# Patient Record
Sex: Female | Born: 1945
Health system: Southern US, Community
[De-identification: ages and names within clinical notes are randomized; demographics above are authoritative.]

## PROBLEM LIST (undated history)

## (undated) DIAGNOSIS — M1990 Unspecified osteoarthritis, unspecified site: Secondary | ICD-10-CM

## (undated) DIAGNOSIS — I499 Cardiac arrhythmia, unspecified: Secondary | ICD-10-CM

## (undated) DIAGNOSIS — K219 Gastro-esophageal reflux disease without esophagitis: Secondary | ICD-10-CM

## (undated) DIAGNOSIS — G4700 Insomnia, unspecified: Secondary | ICD-10-CM

## (undated) DIAGNOSIS — I4891 Unspecified atrial fibrillation: Secondary | ICD-10-CM

## (undated) DIAGNOSIS — R7302 Impaired glucose tolerance (oral): Secondary | ICD-10-CM

## (undated) DIAGNOSIS — I10 Essential (primary) hypertension: Secondary | ICD-10-CM

## (undated) DIAGNOSIS — I7300 Raynaud's syndrome without gangrene: Secondary | ICD-10-CM

## (undated) DIAGNOSIS — C4490 Unspecified malignant neoplasm of skin, unspecified: Secondary | ICD-10-CM

## (undated) HISTORY — DX: Unspecified malignant neoplasm of skin, unspecified: C44.90

## (undated) HISTORY — DX: Raynaud's syndrome without gangrene: I73.00

## (undated) HISTORY — DX: Insomnia, unspecified: G47.00

## (undated) HISTORY — PX: COSMETIC SURGERY: SHX468

## (undated) HISTORY — DX: Impaired glucose tolerance (oral): R73.02

## (undated) HISTORY — PX: JOINT REPLACEMENT: SHX530

## (undated) HISTORY — PX: TUBAL LIGATION: SHX77

## (undated) HISTORY — DX: Gastro-esophageal reflux disease without esophagitis: K21.9

## (undated) HISTORY — DX: Essential (primary) hypertension: I10

## (undated) HISTORY — PX: COLONOSCOPY: SHX174

## (undated) HISTORY — PX: TONSILLECTOMY: SUR1361

## (undated) HISTORY — PX: ABDOMINOPLASTY: SUR9

---

## 1997-07-02 ENCOUNTER — Other Ambulatory Visit: Admission: RE | Admit: 1997-07-02 | Discharge: 1997-07-02 | Payer: Self-pay | Admitting: Gynecology

## 1997-12-01 ENCOUNTER — Ambulatory Visit (HOSPITAL_COMMUNITY): Admission: RE | Admit: 1997-12-01 | Discharge: 1997-12-01 | Payer: Self-pay | Admitting: Gynecology

## 1997-12-10 ENCOUNTER — Other Ambulatory Visit: Admission: RE | Admit: 1997-12-10 | Discharge: 1997-12-10 | Payer: Self-pay | Admitting: Gynecology

## 1998-12-07 ENCOUNTER — Ambulatory Visit (HOSPITAL_COMMUNITY): Admission: RE | Admit: 1998-12-07 | Discharge: 1998-12-07 | Payer: Self-pay | Admitting: Gynecology

## 1998-12-07 ENCOUNTER — Encounter: Payer: Self-pay | Admitting: Gynecology

## 1999-01-12 ENCOUNTER — Other Ambulatory Visit: Admission: RE | Admit: 1999-01-12 | Discharge: 1999-01-12 | Payer: Self-pay | Admitting: Gynecology

## 1999-12-14 ENCOUNTER — Encounter: Payer: Self-pay | Admitting: Gynecology

## 1999-12-14 ENCOUNTER — Ambulatory Visit (HOSPITAL_COMMUNITY): Admission: RE | Admit: 1999-12-14 | Discharge: 1999-12-14 | Payer: Self-pay | Admitting: Gynecology

## 2000-01-25 ENCOUNTER — Other Ambulatory Visit: Admission: RE | Admit: 2000-01-25 | Discharge: 2000-01-25 | Payer: Self-pay | Admitting: Gynecology

## 2000-12-15 ENCOUNTER — Ambulatory Visit (HOSPITAL_COMMUNITY): Admission: RE | Admit: 2000-12-15 | Discharge: 2000-12-15 | Payer: Self-pay | Admitting: Gynecology

## 2000-12-15 ENCOUNTER — Encounter: Payer: Self-pay | Admitting: Gynecology

## 2001-02-06 ENCOUNTER — Other Ambulatory Visit: Admission: RE | Admit: 2001-02-06 | Discharge: 2001-02-06 | Payer: Self-pay | Admitting: Gynecology

## 2001-03-07 HISTORY — PX: BREAST REDUCTION SURGERY: SHX8

## 2002-02-11 ENCOUNTER — Other Ambulatory Visit: Admission: RE | Admit: 2002-02-11 | Discharge: 2002-02-11 | Payer: Self-pay | Admitting: Gynecology

## 2003-02-17 ENCOUNTER — Other Ambulatory Visit: Admission: RE | Admit: 2003-02-17 | Discharge: 2003-02-17 | Payer: Self-pay | Admitting: Gynecology

## 2003-04-14 ENCOUNTER — Ambulatory Visit (HOSPITAL_COMMUNITY): Admission: RE | Admit: 2003-04-14 | Discharge: 2003-04-14 | Payer: Self-pay | Admitting: Internal Medicine

## 2004-02-27 ENCOUNTER — Other Ambulatory Visit: Admission: RE | Admit: 2004-02-27 | Discharge: 2004-02-27 | Payer: Self-pay | Admitting: Gynecology

## 2005-03-24 ENCOUNTER — Other Ambulatory Visit: Admission: RE | Admit: 2005-03-24 | Discharge: 2005-03-24 | Payer: Self-pay | Admitting: Gynecology

## 2012-06-14 ENCOUNTER — Other Ambulatory Visit: Payer: Self-pay | Admitting: Family Medicine

## 2012-07-15 ENCOUNTER — Other Ambulatory Visit: Payer: Self-pay | Admitting: Family Medicine

## 2012-08-12 ENCOUNTER — Other Ambulatory Visit: Payer: Self-pay | Admitting: Family Medicine

## 2012-08-13 ENCOUNTER — Encounter: Payer: Self-pay | Admitting: *Deleted

## 2012-10-07 ENCOUNTER — Other Ambulatory Visit: Payer: Self-pay | Admitting: Family Medicine

## 2012-10-26 ENCOUNTER — Encounter: Payer: Self-pay | Admitting: Family Medicine

## 2012-10-26 ENCOUNTER — Other Ambulatory Visit: Payer: Self-pay | Admitting: Family Medicine

## 2012-10-26 ENCOUNTER — Ambulatory Visit (INDEPENDENT_AMBULATORY_CARE_PROVIDER_SITE_OTHER): Payer: 59 | Admitting: Family Medicine

## 2012-10-26 VITALS — BP 148/100 | Ht 65.0 in | Wt 183.0 lb

## 2012-10-26 DIAGNOSIS — R7301 Impaired fasting glucose: Secondary | ICD-10-CM | POA: Insufficient documentation

## 2012-10-26 DIAGNOSIS — I10 Essential (primary) hypertension: Secondary | ICD-10-CM

## 2012-10-26 DIAGNOSIS — Z79899 Other long term (current) drug therapy: Secondary | ICD-10-CM

## 2012-10-26 DIAGNOSIS — G4700 Insomnia, unspecified: Secondary | ICD-10-CM

## 2012-10-26 DIAGNOSIS — E785 Hyperlipidemia, unspecified: Secondary | ICD-10-CM

## 2012-10-26 LAB — LIPID PANEL
LDL Cholesterol: 113 mg/dL — ABNORMAL HIGH (ref 0–99)
Triglycerides: 52 mg/dL (ref <150)

## 2012-10-26 LAB — HEPATIC FUNCTION PANEL
Alkaline Phosphatase: 58 U/L (ref 39–117)
Indirect Bilirubin: 0.4 mg/dL (ref 0.0–0.9)
Total Bilirubin: 0.5 mg/dL (ref 0.3–1.2)

## 2012-10-26 LAB — BASIC METABOLIC PANEL
BUN: 14 mg/dL (ref 6–23)
Calcium: 9.2 mg/dL (ref 8.4–10.5)
Chloride: 99 mEq/L (ref 96–112)
Creat: 0.6 mg/dL (ref 0.50–1.10)

## 2012-10-26 NOTE — Progress Notes (Signed)
  Subjective:    Patient ID: Sara Terry, female    DOB: 1945/05/13, 67 y.o.   MRN: [id number]  Hypertension This is a chronic problem. The current episode started more than 1 year ago. The problem has been resolved since onset. The problem is controlled. Pertinent negatives include no anxiety or blurred vision. Risk factors for coronary artery disease include dyslipidemia. Past treatments include ACE inhibitors and diuretics. The current treatment provides moderate improvement. There are no compliance problems.  There is no history of angina or heart failure. There is no history of chronic renal disease or coarctation of the aorta.   Here today for 6 month f/u on hypertension.  Sleeping ok, takes [place] rarely. Helps. Benadryl helps too. States she definitely needs to be sleep medicine on hand for times when things flare up.  History of reflux. Clinically stable. Uses a proton pump inhibitor just on a when necessary basis. Actually  History of glucose intolerance. Exercising very regular. Has cut down sugars in the diet. Family history of diabetes.     No other concerns.     Review of Systems  Eyes: Negative for blurred vision.   no chest pain no abdominal pain no back pain ROS otherwise negative     Objective:   Physical Exam  Alert HEENT normal. Lungs clear. Heart regular rate and rhythm. Abdomen benign. Ankles without edema. Blood pressure improved on repeat 134/86      Assessment & Plan:  Impression hypertension likely good control. #2 hyperlipidemia status uncertain. #3 reflux stable #4 insomnia stable. #5 glucose intolerance status uncertain. Plan appropriate blood work. Diet exercise and discussed. Maintain all medications. Check every 6 months. WSL

## 2012-10-28 ENCOUNTER — Encounter: Payer: Self-pay | Admitting: Family Medicine

## 2012-11-10 ENCOUNTER — Other Ambulatory Visit: Payer: Self-pay | Admitting: Family Medicine

## 2013-01-05 ENCOUNTER — Other Ambulatory Visit: Payer: Self-pay | Admitting: Family Medicine

## 2013-02-04 ENCOUNTER — Other Ambulatory Visit: Payer: Self-pay | Admitting: Family Medicine

## 2013-04-17 ENCOUNTER — Encounter (INDEPENDENT_AMBULATORY_CARE_PROVIDER_SITE_OTHER): Payer: Self-pay

## 2013-04-17 ENCOUNTER — Telehealth (INDEPENDENT_AMBULATORY_CARE_PROVIDER_SITE_OTHER): Payer: Self-pay | Admitting: *Deleted

## 2013-04-17 NOTE — Telephone Encounter (Signed)
Rec'd last TCS from [facility], last TCS done 2/05 -- she is due, I have left patient a message to call me so I can schedule

## 2013-04-17 NOTE — Telephone Encounter (Signed)
Message copied by Worthy Keeler on Wed Apr 17, 2013 10:28 AM ------      Message from: Johnston Ebbs      Created: Thu Apr 11, 2013  2:22 PM      Regarding: records of when patient is due TCS       I spoke to this lady last week.  She was not certain when she needed to repeat her TCS.  It looks like last 2005 but there is nothing in her chart about her TCS except that it was done.  NO procedure note.            Can we get this and then call her and let her know.  [phone] ------

## 2013-04-18 NOTE — Telephone Encounter (Signed)
Left message asking patient to call me.

## 2013-04-24 ENCOUNTER — Other Ambulatory Visit (INDEPENDENT_AMBULATORY_CARE_PROVIDER_SITE_OTHER): Payer: Self-pay | Admitting: *Deleted

## 2013-04-24 ENCOUNTER — Telehealth (INDEPENDENT_AMBULATORY_CARE_PROVIDER_SITE_OTHER): Payer: Self-pay | Admitting: *Deleted

## 2013-04-24 DIAGNOSIS — Z1211 Encounter for screening for malignant neoplasm of colon: Secondary | ICD-10-CM

## 2013-04-24 MED ORDER — PEG-KCL-NACL-NASULF-NA ASC-C 100 G PO SOLR: 1.0000 | Freq: Once | ORAL | Status: DC

## 2013-04-24 NOTE — Telephone Encounter (Signed)
Patient needs movi prep 

## 2013-04-24 NOTE — Telephone Encounter (Signed)
TCS sch'd 06/13/13, patient aware

## 2013-05-02 ENCOUNTER — Encounter: Payer: 59 | Admitting: Family Medicine

## 2013-05-08 ENCOUNTER — Encounter: Payer: Self-pay | Admitting: Family Medicine

## 2013-05-08 ENCOUNTER — Ambulatory Visit (HOSPITAL_COMMUNITY)
Admission: RE | Admit: 2013-05-08 | Discharge: 2013-05-08 | Disposition: A | Discharge status: Home / Self Care | Payer: Medicare Other | Source: Ambulatory Visit | Attending: Family Medicine | Admitting: Family Medicine

## 2013-05-08 ENCOUNTER — Ambulatory Visit (INDEPENDENT_AMBULATORY_CARE_PROVIDER_SITE_OTHER): Payer: 59 | Admitting: Family Medicine

## 2013-05-08 VITALS — BP 140/92 | Ht 65.0 in | Wt 184.0 lb

## 2013-05-08 DIAGNOSIS — M545 Low back pain, unspecified: Secondary | ICD-10-CM

## 2013-05-08 MED ORDER — PANTOPRAZOLE SODIUM 40 MG PO TBEC: DELAYED_RELEASE_TABLET | ORAL | Status: DC

## 2013-05-08 MED ORDER — ZOLPIDEM TARTRATE 10 MG PO TABS: 10.0000 mg | ORAL_TABLET | Freq: Every evening | ORAL | Status: DC | PRN

## 2013-05-08 MED ORDER — ENALAPRIL MALEATE 20 MG PO TABS: ORAL_TABLET | ORAL | Status: DC

## 2013-05-08 MED ORDER — HYDROCHLOROTHIAZIDE 25 MG PO TABS: ORAL_TABLET | ORAL | Status: DC
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
## 2013-05-08 NOTE — Patient Instructions (Signed)
Back Exercises Back exercises help treat and prevent back injuries. The goal of back exercises is to increase the strength of your abdominal and back muscles and the flexibility of your back. These exercises should be started when you no longer have back pain. Back exercises include:  Pelvic Tilt. Lie on your back with your knees bent. Tilt your pelvis until the lower part of your back is against the floor. Hold this position 5 to 10 sec and repeat 5 to 10 times.  Knee to Chest. Pull first 1 knee up against your chest and hold for 20 to 30 seconds, repeat this with the other knee, and then both knees. This may be done with the other leg straight or bent, whichever feels better.  Sit-Ups or Curl-Ups. Bend your knees 90 degrees. Start with tilting your pelvis, and do a partial, slow sit-up, lifting your trunk only 30 to 45 degrees off the floor. Take at least 2 to 3 seconds for each sit-up. Do not do sit-ups with your knees out straight. If partial sit-ups are difficult, simply do the above but with only tightening your abdominal muscles and holding it as directed.  Hip-Lift. Lie on your back with your knees flexed 90 degrees. Push down with your feet and shoulders as you raise your hips a couple inches off the floor; hold for 10 seconds, repeat 5 to 10 times.  Back arches. Lie on your stomach, propping yourself up on bent elbows. Slowly press on your hands, causing an arch in your low back. Repeat 3 to 5 times. Any initial stiffness and discomfort should lessen with repetition over time.  Shoulder-Lifts. Lie face down with arms beside your body. Keep hips and torso pressed to floor as you slowly lift your head and shoulders off the floor. Do not overdo your exercises, especially in the beginning. Exercises may cause you some mild back discomfort which lasts for a few minutes; however, if the pain is more severe, or lasts for more than 15 minutes, do not continue exercises until you see your caregiver.  Improvement with exercise therapy for back problems is slow.  See your caregivers for assistance with developing a proper back exercise program. Document Released: 03/31/2004 Document Revised: 05/16/2011 Document Reviewed: 12/23/2010 Exit[employer] Patient Information 2014 [employer].  

## 2013-05-24 ENCOUNTER — Telehealth (INDEPENDENT_AMBULATORY_CARE_PROVIDER_SITE_OTHER): Payer: Self-pay | Admitting: *Deleted

## 2013-05-24 NOTE — Telephone Encounter (Signed)
  Procedure: tcs  Reason/Indication:  screening  Has patient had this procedure before?  Yes, 2005 -- scanned  If so, when, by whom and where?    Is there a family history of colon cancer?  no  Who?  What age when diagnosed?    Is patient diabetic?   no      Does patient have prosthetic heart valve?  no  Do you have a pacemaker?  no  Has patient ever had endocarditis? no  Has patient had joint replacement within last 12 months?  no  Does patient tend to be constipated or take laxatives? no  Is patient on Coumadin, Plavix and/or Aspirin? yes  Medications: asa 81 mg daily, stool softener, enalapril 20 mg daily, hctz 25 mg daily, pantoprazole 40 mg prn  Allergies: pcn  Medication Adjustment: asa 2 days  Procedure date & time: 06/13/13

## 2013-05-27 NOTE — Telephone Encounter (Signed)
agree

## 2013-06-04 ENCOUNTER — Encounter (HOSPITAL_COMMUNITY): Payer: Self-pay | Admitting: Pharmacy Technician

## 2013-06-13 ENCOUNTER — Encounter (HOSPITAL_COMMUNITY): Payer: Self-pay | Admitting: *Deleted

## 2013-06-13 ENCOUNTER — Encounter (HOSPITAL_COMMUNITY)
Admission: RE | Disposition: A | Discharge status: Home / Self Care | Payer: Self-pay | Source: Ambulatory Visit | Attending: Internal Medicine

## 2013-06-13 ENCOUNTER — Ambulatory Visit (HOSPITAL_COMMUNITY)
Admission: RE | Admit: 2013-06-13 | Discharge: 2013-06-13 | Disposition: A | Discharge status: Home / Self Care | Payer: Medicare Other | Source: Ambulatory Visit | Attending: Internal Medicine | Admitting: Internal Medicine

## 2013-06-13 DIAGNOSIS — K644 Residual hemorrhoidal skin tags: Secondary | ICD-10-CM | POA: Insufficient documentation

## 2013-06-13 DIAGNOSIS — Z7982 Long term (current) use of aspirin: Secondary | ICD-10-CM | POA: Insufficient documentation

## 2013-06-13 DIAGNOSIS — Z1211 Encounter for screening for malignant neoplasm of colon: Secondary | ICD-10-CM | POA: Insufficient documentation

## 2013-06-13 DIAGNOSIS — I10 Essential (primary) hypertension: Secondary | ICD-10-CM | POA: Insufficient documentation

## 2013-06-13 DIAGNOSIS — Z79899 Other long term (current) drug therapy: Secondary | ICD-10-CM | POA: Insufficient documentation

## 2013-06-13 HISTORY — PX: COLONOSCOPY: SHX5424

## 2013-06-13 SURGERY — COLONOSCOPY
Anesthesia: Moderate Sedation

## 2013-06-13 MED ORDER — MEPERIDINE HCL 50 MG/ML IJ SOLN
INTRAMUSCULAR | Status: AC
  Filled 2013-06-13: qty 1

## 2013-06-13 MED ORDER — MIDAZOLAM HCL 5 MG/5ML IJ SOLN
INTRAMUSCULAR | Status: AC
  Filled 2013-06-13: qty 10

## 2013-06-13 MED ORDER — MEPERIDINE HCL 50 MG/ML IJ SOLN
INTRAMUSCULAR | Status: DC | PRN
  Administered 2013-06-13 (×2): 25 mg via INTRAVENOUS

## 2013-06-13 MED ORDER — MIDAZOLAM HCL 5 MG/5ML IJ SOLN
INTRAMUSCULAR | Status: DC | PRN
  Administered 2013-06-13 (×5): 2 mg via INTRAVENOUS

## 2013-06-13 MED ORDER — STERILE WATER FOR IRRIGATION IR SOLN
Status: DC | PRN
  Administered 2013-06-13: 10:00:00

## 2013-06-13 NOTE — Op Note (Signed)
COLONOSCOPY PROCEDURE REPORT  PATIENT:  Sara Terry  MR#:  [id number] Birthdate:  [birth year], 68 y.o., female Endoscopist:  Dr. Rogene Houston, MD Referred By:  Dr. Grace Bushy. Wolfgang Phoenix, MD Procedure Date: 06/13/2013  Procedure:   Colonoscopy  Indications: Patient is 68 year old Caucasian female who is here for average risk screening colonoscopy. Patient's last exam was in February 2005.  Informed Consent:  The procedure and risks were reviewed with the patient and informed consent was obtained.  Medications:  Demerol 50 mg IV Versed 10 mg IV  Description of procedure:  After a digital rectal exam was performed, that colonoscope was advanced from the anus through the rectum and colon to the area of the cecum, ileocecal valve and appendiceal orifice. The cecum was deeply intubated. These structures were well-seen and photographed for the record. From the level of the cecum and ileocecal valve, the scope was slowly and cautiously withdrawn. The mucosal surfaces were carefully surveyed utilizing scope tip to flexion to facilitate fold flattening as needed. The scope was pulled down into the rectum where a thorough exam including retroflexion was performed.  Findings:   Prep excellent. Normal mucosa various segments of colon and rectum. Small hemorrhoids below the dentate line.   Therapeutic/Diagnostic Maneuvers Performed:  None  Complications:  None  Cecal Withdrawal Time:  11 minutes  Impression:  Normal colonoscopy except small external hemorrhoids.  Recommendations:  Standard instructions given. Next screening exam in 10 years.  Rogene Houston  06/13/2013 10:10 AM  CC: Dr. Rubbie Battiest, MD & Dr. Rayne Du ref. provider found

## 2013-06-13 NOTE — Discharge Instructions (Signed)
Resume usual medications and diet. No driving for 24 hours. Next screening exam in 10 years.

## 2013-06-13 NOTE — H&P (Signed)
Sara Terry is an 68 y.o. female.   Chief Complaint: Patient is  here for colonoscopy. HPI: Patient is a 68 year old Caucasian female who is here for screening colonoscopy. She denies abdominal pain change in her bowel habits or rectal bleeding.  Last colonoscopy was in February 2005. Family history is negative for CRC to  Past Medical History  Diagnosis Date  . Hypertension   . Raynaud phenomenon   . Insomnia   . Reflux   . Glucose intolerance (impaired glucose tolerance)     Past Surgical History  Procedure Laterality Date  . Tubal ligation    . Tonsillectomy    . Breast reduction surgery  2003  . Colonoscopy      Family History  Problem Relation Age of Onset  . Hypertension Mother   . Hypertension Father   . Diabetes Father   . Heart attack Father   . Hypertension Brother    Social History:  reports that she has never smoked. She does not have any smokeless tobacco history on file. Her alcohol and drug histories are not on file.  Allergies:  Allergies  Allergen Reactions  . Maxzide [Triamterene-Hctz]   . Penicillins     Medications Prior to Admission  Medication Sig Dispense Refill  . aspirin 81 MG tablet Take 81 mg by mouth daily.      Marland Kitchen docusate sodium (COLACE) 100 MG capsule Take 100 mg by mouth daily.      . enalapril (VASOTEC) 20 MG tablet take 1 tablet by mouth once daily  30 tablet  5  . hydrochlorothiazide (HYDRODIURIL) 25 MG tablet take 1 tablet by mouth every morning  30 tablet  5  . omeprazole (PRILOSEC) 20 MG capsule Take 20 mg by mouth daily as needed (heartburn).       . zolpidem (AMBIEN) 10 MG tablet Take 1 tablet (10 mg total) by mouth at bedtime as needed for sleep.  30 tablet  0    No results found for this or any previous visit (from the past 48 hour(s)). No results found.  ROS  Blood pressure 173/94, pulse 69, temperature 98.7 F (37.1 C), temperature source Oral, resp. rate 13, SpO2 100.00%. Physical Exam  Constitutional: She appears  well-developed and well-nourished.  HENT:  Mouth/Throat: Oropharynx is clear and moist.  Eyes: Conjunctivae are normal. No scleral icterus.  Neck: No thyromegaly present.  Cardiovascular: Normal rate, regular rhythm and normal heart sounds.   No murmur heard. Respiratory: Effort normal and breath sounds normal.  GI: Soft. She exhibits no distension and no mass. There is no tenderness.  Long horizontal scar across lower abdomen  Musculoskeletal: She exhibits no edema.  Lymphadenopathy:    She has no cervical adenopathy.  Neurological: She is alert.  Skin: Skin is warm and dry.     Assessment/Plan Average risk screening colonoscopy.  Rogene Houston 06/13/2013, 9:34 AM

## 2013-06-17 ENCOUNTER — Encounter (HOSPITAL_COMMUNITY): Payer: Self-pay | Admitting: Internal Medicine

## 2013-07-25 ENCOUNTER — Encounter: Payer: Self-pay | Admitting: Family Medicine

## 2013-08-02 ENCOUNTER — Ambulatory Visit: Payer: 59 | Admitting: Family Medicine

## 2013-11-27 ENCOUNTER — Other Ambulatory Visit: Payer: Self-pay | Admitting: Family Medicine

## 2013-12-26 ENCOUNTER — Ambulatory Visit (HOSPITAL_COMMUNITY)
Admission: RE | Admit: 2013-12-26 | Discharge: 2013-12-26 | Disposition: A | Discharge status: Home / Self Care | Payer: Medicare Other | Source: Ambulatory Visit | Attending: Family Medicine | Admitting: Family Medicine

## 2013-12-26 ENCOUNTER — Encounter: Payer: Self-pay | Admitting: Family Medicine

## 2013-12-26 ENCOUNTER — Ambulatory Visit (INDEPENDENT_AMBULATORY_CARE_PROVIDER_SITE_OTHER): Payer: 59 | Admitting: Family Medicine

## 2013-12-26 VITALS — BP 142/94 | Ht 65.0 in | Wt 184.0 lb

## 2013-12-26 DIAGNOSIS — M25551 Pain in right hip: Secondary | ICD-10-CM | POA: Insufficient documentation

## 2013-12-26 DIAGNOSIS — Z23 Encounter for immunization: Secondary | ICD-10-CM

## 2013-12-26 MED ORDER — ENALAPRIL MALEATE 20 MG PO TABS: ORAL_TABLET | ORAL | Status: DC

## 2013-12-26 MED ORDER — DICLOFENAC SODIUM 75 MG PO TBEC
75.0000 mg | DELAYED_RELEASE_TABLET | Freq: Two times a day (BID) | ORAL | Status: DC
Start: 2013-12-26 — End: 2014-04-03

## 2013-12-26 MED ORDER — HYDROCHLOROTHIAZIDE 25 MG PO TABS: ORAL_TABLET | ORAL | Status: DC

## 2013-12-26 NOTE — Progress Notes (Signed)
   Subjective:    Patient ID: Sara Terry, female    DOB: [birth year], 68 y.o.   MRN: [id number]  Hypertension This is a chronic problem. There are no compliance problems.    Bp a little hi recently  Exercising regularly  Experiencing pain in hip and leg  Bothered for a couple of months   takes advil two to four per day, not every day  Post hip discomfort and kind of a numb like feeling  Limping and walking harder, keep going anyway  Compliant with blood pressure medication. Watching salt intake. Blood pressure generally good when checked elsewhere.  Uses Ambien only rarely for sleep.   Review of Systems No headache no chest pain no abdominal pain no change in bowel habits no blood in stool no rash ROS otherwise negative    Objective:   Physical Exam Alert no acute distress. HEENT normal. Lungs clear. Heart rare rhythm. Hip good range of motion. Some pain with internal rotation. Mild posterior right sciatic notch tenderness. Negative straight leg raise.       Assessment & Plan:  Impression hip bursitis versus tendinitis inflammation. #2 hypertension good control. #3 insomnia #4 reflux discussed plan maintain same medications. Ativan inflammatory. Right hip x-ray local measures and stretching discussed. Followup in 6 months. Pneumonia shot also WSL

## 2013-12-31 NOTE — Addendum Note (Signed)
Addended by: Dairl Ponder on: 12/31/2013 08:49 AM   Modules accepted: Orders

## 2014-01-15 ENCOUNTER — Telehealth: Payer: Self-pay | Admitting: *Deleted

## 2014-01-15 NOTE — Telephone Encounter (Signed)
Patient notified and verbalized understanding. She will discuss with ortho on 12/4 and call back sooner if s/s get worst.

## 2014-01-15 NOTE — Telephone Encounter (Signed)
Patient said her friends told her that her left knee and left ankle are swollen. Pt said she did not even notice. She said it does feel tight. No pain or redness. Her friends noticed this a couple of days ago and they are still swollen. The patient would like to know if she needs to be seen or if there is something you suggest?    Cell [phone]

## 2014-01-15 NOTE — Telephone Encounter (Signed)
Just a wk ago dxed with advanced arthritis in hip could well have in knee and ankle. Pt can disc with ortho as already referred, or can come in for eval

## 2014-02-21 ENCOUNTER — Other Ambulatory Visit: Payer: Self-pay | Admitting: Family Medicine

## 2014-04-03 ENCOUNTER — Ambulatory Visit (INDEPENDENT_AMBULATORY_CARE_PROVIDER_SITE_OTHER): Payer: Medicare Other | Admitting: Family Medicine

## 2014-04-03 ENCOUNTER — Ambulatory Visit (HOSPITAL_COMMUNITY)
Admission: RE | Admit: 2014-04-03 | Discharge: 2014-04-03 | Disposition: A | Discharge status: Home / Self Care | Payer: Medicare Other | Source: Ambulatory Visit | Attending: Family Medicine | Admitting: Family Medicine

## 2014-04-03 ENCOUNTER — Encounter: Payer: Self-pay | Admitting: Family Medicine

## 2014-04-03 ENCOUNTER — Telehealth: Payer: Self-pay | Admitting: Family Medicine

## 2014-04-03 VITALS — BP 110/72 | Temp 97.9°F | Ht 65.5 in | Wt 189.0 lb

## 2014-04-03 DIAGNOSIS — M542 Cervicalgia: Secondary | ICD-10-CM | POA: Insufficient documentation

## 2014-04-03 MED ORDER — DICLOFENAC SODIUM 75 MG PO TBEC: 75.0000 mg | DELAYED_RELEASE_TABLET | Freq: Two times a day (BID) | ORAL | Status: DC

## 2014-04-03 MED ORDER — OXYCODONE-ACETAMINOPHEN 5-325 MG PO TABS: 1.0000 | ORAL_TABLET | Freq: Three times a day (TID) | ORAL | Status: DC | PRN

## 2014-04-03 MED ORDER — CHLORZOXAZONE 500 MG PO TABS: ORAL_TABLET | ORAL | Status: DC

## 2014-04-03 NOTE — Progress Notes (Signed)
   Subjective:    Patient ID: Sara Terry, female    DOB: 01/04/1946, 69 y.o.   MRN: [id number]  Neck Pain  The current episode started in the past 7 days. The pain is present in the right side. The symptoms are aggravated by position. Associated symptoms include headaches. Treatments tried: stretching exercises and heat. The treatment provided no relief.    She denies any injury denies any previous history of this is tried yoga does tried stretching ibuprofen without any help. Denies radiation down the arms denies weakness in the arms  Review of Systems  Musculoskeletal: Positive for neck pain.  Neurological: Positive for headaches.       Objective:   Physical Exam Limited range of motion of the neck is noted with limited range of motion looking to the left right up and down. Lungs are clear hearts regular Pulse normal. Reflexes good strength in the arms good.       Assessment & Plan:  Cervical pain with cervical spasms no radiation down the arms I find no evidence of any type of herniated disc at this point x-ray the neck indicated. Anti-inflammatory recommended over the course of the next 2 weeks, gentle range of motion excises, muscle relaxer at night. Percocet only if necessary nighttime cautioned drowsiness. If progression with numbness or burning down the arms or other symptoms she may end up needing to have MRI. Follow-up if ongoing trouble.

## 2014-04-03 NOTE — Telephone Encounter (Signed)
Transferred patient to front desk to schedule appointment.  

## 2014-04-03 NOTE — Telephone Encounter (Signed)
Pt called in stating that she woke up to a painful stiff neck and  Is wanting to know if something can be called in for her.

## 2014-04-07 ENCOUNTER — Telehealth: Payer: Self-pay | Admitting: *Deleted

## 2014-04-07 NOTE — Telephone Encounter (Signed)
Seen on Thursday. Pt was asked to call back and let u know how she is feeling. Pt states she is feeling better. No pain now. Still taking muscle relaxer once daily and the antiinflammatory bid. Stopped pain med. Pt wants to know if she should stop meds or continue since she is feeling better. Send message on mychart if after 9:45 am.

## 2014-04-07 NOTE — Telephone Encounter (Signed)
Sent message to pt on my chart

## 2014-04-07 NOTE — Telephone Encounter (Signed)
If good range of motion then may d/c muscle relaxer. Also use anti in flammatory thru [facility] and if doing well then d/c med

## 2014-05-21 ENCOUNTER — Ambulatory Visit (INDEPENDENT_AMBULATORY_CARE_PROVIDER_SITE_OTHER): Payer: Medicare Other | Admitting: Family Medicine

## 2014-05-21 ENCOUNTER — Encounter: Payer: Self-pay | Admitting: Family Medicine

## 2014-05-21 ENCOUNTER — Other Ambulatory Visit: Payer: Self-pay | Admitting: Family Medicine

## 2014-05-21 VITALS — BP 156/90 | Ht 65.5 in | Wt 189.0 lb

## 2014-05-21 DIAGNOSIS — I10 Essential (primary) hypertension: Secondary | ICD-10-CM | POA: Diagnosis not present

## 2014-05-21 NOTE — Patient Instructions (Signed)
Long term goal is for our top number to be under 140 and our bottom number under 85, or course this is [place] and their are specialists whom say otherwise.most experts still feel this is a good and attainable goal  [employer] sphygmomanometer

## 2014-05-21 NOTE — Progress Notes (Signed)
   Subjective:    Patient ID: Sara Terry, female    DOB: 1945-10-01, 69 y.o.   MRN: [id number]  Hypertension This is a chronic problem. The current episode started more than 1 year ago. The problem has been gradually worsening since onset. The problem is uncontrolled. There are no associated agents to hypertension. There are no known risk factors for coronary artery disease. Treatments tried: enalapril, HCTZ. The current treatment provides moderate improvement. There are no compliance problems.    Patient states that she has an appointment scheduled in April and wants to know if she needs to kept it after today's visit.    Review of Systems  no headache no chest pain no back pain    Objective:   Physical Exam   alert no apparent distress vitals stable lungs clear heart regular in rhythm blood pressure 130/78 with large cuff      Assessment & Plan:   impression hypertension control good not good last week

## 2014-06-25 ENCOUNTER — Ambulatory Visit: Payer: 59 | Admitting: Family Medicine

## 2014-07-04 ENCOUNTER — Ambulatory Visit (INDEPENDENT_AMBULATORY_CARE_PROVIDER_SITE_OTHER): Payer: Medicare Other | Admitting: Family Medicine

## 2014-07-04 ENCOUNTER — Encounter: Payer: Self-pay | Admitting: Family Medicine

## 2014-07-04 VITALS — BP 126/84 | Ht 65.5 in | Wt 187.2 lb

## 2014-07-04 DIAGNOSIS — I10 Essential (primary) hypertension: Secondary | ICD-10-CM

## 2014-07-04 DIAGNOSIS — Z79899 Other long term (current) drug therapy: Secondary | ICD-10-CM

## 2014-07-04 DIAGNOSIS — E785 Hyperlipidemia, unspecified: Secondary | ICD-10-CM

## 2014-07-04 MED ORDER — ENALAPRIL MALEATE 20 MG PO TABS: 20.0000 mg | ORAL_TABLET | Freq: Every day | ORAL | Status: DC

## 2014-07-04 MED ORDER — HYDROCHLOROTHIAZIDE 25 MG PO TABS: ORAL_TABLET | ORAL | Status: DC

## 2014-07-04 MED ORDER — PANTOPRAZOLE SODIUM 40 MG PO TBEC: DELAYED_RELEASE_TABLET | ORAL | Status: DC

## 2014-07-04 NOTE — Progress Notes (Signed)
   Subjective:    Patient ID: Sara Terry, female    DOB: 1945-06-01, 69 y.o.   MRN: [id number]  Hypertension This is a chronic problem. The current episode started more than 1 year ago. Treatments tried: vasotec, hctz. There are no compliance problems.     Cking bp at home  Top numb , cks any time  Uses benadryl prn, ambien emergency only  Reflux requires the med, went off the medication the symptoms rebound.  History of hyperlipidemia. Patient claims fair attention to diet. Review of Systems No headache no chest pain no abdominal pain no change in bowel habits    Objective:   Physical Exam Alert vitals stable HEENT normal. Lungs clear heart regular in rhythm.       Assessment & Plan:  Impression 1 hypertension good control discussed #2 insomnia #3 reflux plan maintain same medications high #4 hyperlipidemia status uncertain. #5 impaired fasting glucose Plan. Diet exercise discussed. Keep checking own blood pressures and bring in on next visit along with cuff. Recheck in 6 months . Appropriate blood work further recommendations pending WSL

## 2014-07-05 LAB — HEPATIC FUNCTION PANEL
ALT: 17 IU/L (ref 0–32)
AST: 17 IU/L (ref 0–40)
Albumin: 4.6 g/dL (ref 3.6–4.8)
Alkaline Phosphatase: 66 IU/L (ref 39–117)
BILIRUBIN, DIRECT: 0.09 mg/dL (ref 0.00–0.40)
Bilirubin Total: <0.2 mg/dL (ref 0.0–1.2)
Total Protein: 6.6 g/dL (ref 6.0–8.5)

## 2014-07-05 LAB — BASIC METABOLIC PANEL
BUN / CREAT RATIO: 26 (ref 11–26)
BUN: 16 mg/dL (ref 8–27)
CO2: 27 mmol/L (ref 18–29)
Calcium: 10.1 mg/dL (ref 8.7–10.3)
Chloride: 95 mmol/L — ABNORMAL LOW (ref 97–108)
Creatinine, Ser: 0.62 mg/dL (ref 0.57–1.00)
GFR calc Af Amer: 108 mL/min/{1.73_m2} (ref >59)
GFR, EST NON AFRICAN AMERICAN: 94 mL/min/{1.73_m2} (ref >59)
GLUCOSE: 121 mg/dL — AB (ref 65–99)
Potassium: 4.7 mmol/L (ref 3.5–5.2)
SODIUM: 136 mmol/L (ref 134–144)

## 2014-07-05 LAB — LIPID PANEL
CHOL/HDL RATIO: 2.7 ratio (ref 0.0–4.4)
Cholesterol, Total: 215 mg/dL — ABNORMAL HIGH (ref 100–199)
HDL: 79 mg/dL (ref >39)
LDL Calculated: 123 mg/dL — ABNORMAL HIGH (ref 0–99)
Triglycerides: 64 mg/dL (ref 0–149)
VLDL Cholesterol Cal: 13 mg/dL (ref 5–40)

## 2014-08-19 ENCOUNTER — Encounter: Payer: Self-pay | Admitting: Family Medicine

## 2014-08-26 ENCOUNTER — Encounter: Payer: Self-pay | Admitting: Family Medicine

## 2014-08-28 MED ORDER — PREDNISONE 20 MG PO TABS: ORAL_TABLET | ORAL | Status: DC

## 2014-12-31 ENCOUNTER — Other Ambulatory Visit: Payer: Self-pay | Admitting: Family Medicine

## 2015-01-05 ENCOUNTER — Ambulatory Visit (INDEPENDENT_AMBULATORY_CARE_PROVIDER_SITE_OTHER): Payer: Medicare Other | Admitting: Family Medicine

## 2015-01-05 ENCOUNTER — Encounter: Payer: Self-pay | Admitting: Family Medicine

## 2015-01-05 VITALS — BP 132/86 | Ht 65.5 in | Wt 191.0 lb

## 2015-01-05 DIAGNOSIS — Z79899 Other long term (current) drug therapy: Secondary | ICD-10-CM | POA: Diagnosis not present

## 2015-01-05 DIAGNOSIS — Z23 Encounter for immunization: Secondary | ICD-10-CM

## 2015-01-05 DIAGNOSIS — I10 Essential (primary) hypertension: Secondary | ICD-10-CM | POA: Diagnosis not present

## 2015-01-05 MED ORDER — ZOLPIDEM TARTRATE 10 MG PO TABS: 10.0000 mg | ORAL_TABLET | Freq: Every evening | ORAL | Status: DC | PRN

## 2015-01-05 MED ORDER — PANTOPRAZOLE SODIUM 40 MG PO TBEC: DELAYED_RELEASE_TABLET | ORAL | Status: DC

## 2015-01-05 MED ORDER — HYDROCHLOROTHIAZIDE 25 MG PO TABS: ORAL_TABLET | ORAL | Status: DC

## 2015-01-05 MED ORDER — ENALAPRIL MALEATE 20 MG PO TABS: 20.0000 mg | ORAL_TABLET | Freq: Every day | ORAL | Status: DC

## 2015-01-05 NOTE — Progress Notes (Signed)
   Subjective:    Patient ID: Sara Terry, female    DOB: February 04, 1946, 69 y.o.   MRN: [id number]  Hypertension This is a chronic problem. There are no compliance problems (rides exercise bike and does light yoga).    No concerns or problems today.   Flu vaccine today.  Com compliant pliant with blood pressure medication. Has not missed any doses has cut down salt intake. Exercises regularly no obvious side effects. Review of Systems No headache no chest pain no back pain ROS otherwise negative    Objective:   Physical Exam Alert vital stable blood pressure excellent on repeat. HEENT normal lungs clear heart regular rate and rhythm       Assessment & Plan:  Impression 1 hypertension great control plan maintain same meds diet exercise discussed flu shot today recheck in 6 months WSL

## 2015-07-06 ENCOUNTER — Ambulatory Visit (INDEPENDENT_AMBULATORY_CARE_PROVIDER_SITE_OTHER): Payer: Medicare Other | Admitting: Family Medicine

## 2015-07-06 ENCOUNTER — Encounter: Payer: Self-pay | Admitting: Family Medicine

## 2015-07-06 VITALS — BP 128/82 | Ht 65.5 in | Wt 194.8 lb

## 2015-07-06 DIAGNOSIS — Z79899 Other long term (current) drug therapy: Secondary | ICD-10-CM

## 2015-07-06 DIAGNOSIS — I10 Essential (primary) hypertension: Secondary | ICD-10-CM

## 2015-07-06 DIAGNOSIS — Z13220 Encounter for screening for lipoid disorders: Secondary | ICD-10-CM

## 2015-07-06 MED ORDER — ENALAPRIL MALEATE 20 MG PO TABS: 20.0000 mg | ORAL_TABLET | Freq: Every day | ORAL | Status: DC

## 2015-07-06 MED ORDER — PANTOPRAZOLE SODIUM 40 MG PO TBEC: DELAYED_RELEASE_TABLET | ORAL | Status: DC

## 2015-07-06 MED ORDER — ZOLPIDEM TARTRATE 10 MG PO TABS: 10.0000 mg | ORAL_TABLET | Freq: Every evening | ORAL | Status: DC | PRN

## 2015-07-06 MED ORDER — HYDROCHLOROTHIAZIDE 25 MG PO TABS: ORAL_TABLET | ORAL | Status: DC

## 2015-07-06 NOTE — Progress Notes (Signed)
   Subjective:    Patient ID: SHEZA WESTRICH, female    DOB: 10/01/1945, 70 y.o.   MRN: [id number]  Hypertension This is a chronic problem. The current episode started more than 1 year ago. Risk factors for coronary artery disease include post-menopausal state. Treatments tried: vasotec, hctz. There are no compliance problems.    No problems or concerns per patient  Sticks with medicines  Pt has had three cort injec, bone on bone   Exercise has fell off, not doing as well, working on yoga, riding the bike some   Review of Systems No headache, no major weight loss or weight gain, no chest pain no back pain abdominal pain no change in bowel habits complete ROS otherwise negative     Objective:   Physical Exam  Alert vitals stable. HEENT normal. Lungs clear. Heart regular rate and rhythm      Assessment & Plan:  Impression 1 hypertension good control discussed plan maintain same meds appropriate blood work diet exercise discussed WSL

## 2015-07-10 LAB — HEPATIC FUNCTION PANEL
ALT: 18 IU/L (ref 0–32)
AST: 18 IU/L (ref 0–40)
Albumin: 4.3 g/dL (ref 3.6–4.8)
Alkaline Phosphatase: 60 IU/L (ref 39–117)
BILIRUBIN TOTAL: 0.3 mg/dL (ref 0.0–1.2)
Bilirubin, Direct: 0.12 mg/dL (ref 0.00–0.40)
TOTAL PROTEIN: 6.4 g/dL (ref 6.0–8.5)

## 2015-07-10 LAB — LIPID PANEL
CHOL/HDL RATIO: 3.2 ratio (ref 0.0–4.4)
Cholesterol, Total: 195 mg/dL (ref 100–199)
HDL: 61 mg/dL (ref >39)
LDL CALC: 117 mg/dL — AB (ref 0–99)
Triglycerides: 86 mg/dL (ref 0–149)
VLDL Cholesterol Cal: 17 mg/dL (ref 5–40)

## 2015-07-10 LAB — BASIC METABOLIC PANEL
BUN / CREAT RATIO: 23 (ref 12–28)
BUN: 15 mg/dL (ref 8–27)
CALCIUM: 9.1 mg/dL (ref 8.7–10.3)
CO2: 23 mmol/L (ref 18–29)
CREATININE: 0.64 mg/dL (ref 0.57–1.00)
Chloride: 96 mmol/L (ref 96–106)
GFR calc non Af Amer: 92 mL/min/{1.73_m2} (ref >59)
GFR, EST AFRICAN AMERICAN: 106 mL/min/{1.73_m2} (ref >59)
GLUCOSE: 119 mg/dL — AB (ref 65–99)
Potassium: 4.2 mmol/L (ref 3.5–5.2)
Sodium: 138 mmol/L (ref 134–144)

## 2015-07-12 ENCOUNTER — Encounter: Payer: Self-pay | Admitting: Family Medicine

## 2015-07-27 ENCOUNTER — Encounter: Payer: Self-pay | Admitting: Family Medicine

## 2015-09-24 ENCOUNTER — Ambulatory Visit (INDEPENDENT_AMBULATORY_CARE_PROVIDER_SITE_OTHER): Payer: Medicare Other | Admitting: Podiatry

## 2015-09-24 ENCOUNTER — Encounter: Payer: Self-pay | Admitting: Podiatry

## 2015-09-24 VITALS — BP 161/117 | HR 110 | Resp 16 | Ht 65.0 in | Wt 190.0 lb

## 2015-09-24 DIAGNOSIS — L84 Corns and callosities: Secondary | ICD-10-CM | POA: Diagnosis not present

## 2015-09-24 DIAGNOSIS — M779 Enthesopathy, unspecified: Secondary | ICD-10-CM

## 2015-09-24 DIAGNOSIS — M216X9 Other acquired deformities of unspecified foot: Secondary | ICD-10-CM

## 2015-09-24 MED ORDER — TRIAMCINOLONE ACETONIDE 10 MG/ML IJ SUSP
10.0000 mg | Freq: Once | INTRAMUSCULAR | Status: AC
  Administered 2015-09-24: 10 mg

## 2015-09-24 NOTE — Progress Notes (Signed)
   Subjective:    Patient ID: Sara Terry, female    DOB: [birth year], 70 y.o.   MRN: [id number]  HPI  Chief Complaint  Patient presents with  . Painful lesions    Left foot; plantar forefoot (below 2nd toe & below 5th toe); x2 months     Review of Systems  All other systems reviewed and are negative.      Objective:   Physical Exam        Assessment & Plan:

## 2015-09-24 NOTE — Progress Notes (Signed)
Subjective:     Patient ID: Sara Terry, female   DOB: September 01, 1945, 70 y.o.   MRN: [id number]  HPI patient states she has a lot of pain on the outside of her left foot and it's making increasingly difficult for her to walk with any degree of comfort. Does not remember injury   Review of Systems  All other systems reviewed and are negative.      Objective:   Physical Exam  Constitutional: She is oriented to person, place, and time.  Cardiovascular: Intact distal pulses.   Musculoskeletal: Normal range of motion.  Neurological: She is oriented to person, place, and time.  Skin: Skin is warm.  Nursing note and vitals reviewed.  neurovascular status intact muscle strength adequate range of motion within normal limits with patient found have keratotic lesion sub-fifth metatarsal left with fluid buildup and pain and change in gait upon evaluation. Patient's found have good digital perfusion and is well oriented 3     Assessment:     Inflammatory capsulitis fifth MPJ left with fluid buildup and pain    Plan:     H&P and x-ray discussed and at this time I injected the fifth MPJ left 3 mg Dexon some Kenalog 5 mg Xylocaine and went ahead and did deep debridement of lesion. Reappoint as symptoms indicate

## 2015-11-05 ENCOUNTER — Ambulatory Visit (INDEPENDENT_AMBULATORY_CARE_PROVIDER_SITE_OTHER): Payer: Medicare Other | Admitting: Podiatry

## 2015-11-05 ENCOUNTER — Encounter: Payer: Self-pay | Admitting: Podiatry

## 2015-11-05 ENCOUNTER — Ambulatory Visit (INDEPENDENT_AMBULATORY_CARE_PROVIDER_SITE_OTHER): Payer: Medicare Other

## 2015-11-05 VITALS — BP 146/94 | HR 82 | Resp 16

## 2015-11-05 DIAGNOSIS — M79671 Pain in right foot: Secondary | ICD-10-CM

## 2015-11-05 DIAGNOSIS — M779 Enthesopathy, unspecified: Secondary | ICD-10-CM | POA: Diagnosis not present

## 2015-11-05 MED ORDER — TRIAMCINOLONE ACETONIDE 10 MG/ML IJ SUSP
10.0000 mg | Freq: Once | INTRAMUSCULAR | Status: AC
  Administered 2015-11-05: 10 mg

## 2015-11-05 NOTE — Progress Notes (Signed)
Subjective:     Patient ID: Sara Terry, female   DOB: 1945-05-18, 70 y.o.   MRN: [id number]  HPI patient presents with pain in the arch of the right foot and states that she was doing a lot of walking with a different type of shoe   Review of Systems     Objective:   Physical Exam Neurovascular status unchanged with tendinitis of the posterior tibial tendon right at the insertional point of the tendon into the posterior tib and navicular bone with no indication of rupture    Assessment:     Posterior tibial tendinitis right secondary to strain    Plan:     Reviewed x-ray and careful sheath injection administered 3 mg Kenalog 5 mg Xylocaine and instructed on utilization of supportive shoes and fascial brace which was dispensed today. Reappoint as needed  X-ray report was negative for signs of fracture and didn't reveal mild depression of the arch

## 2015-11-08 ENCOUNTER — Encounter (HOSPITAL_COMMUNITY): Payer: Self-pay | Admitting: *Deleted

## 2015-11-08 ENCOUNTER — Emergency Department (HOSPITAL_COMMUNITY): Payer: Medicare Other

## 2015-11-08 ENCOUNTER — Inpatient Hospital Stay (HOSPITAL_COMMUNITY)
Admission: EM | Admit: 2015-11-08 | Discharge: 2015-11-11 | DRG: 310 | Disposition: A | Discharge status: Home / Self Care | Payer: Medicare Other | Attending: Internal Medicine | Admitting: Internal Medicine

## 2015-11-08 DIAGNOSIS — Z880 Allergy status to penicillin: Secondary | ICD-10-CM

## 2015-11-08 DIAGNOSIS — E785 Hyperlipidemia, unspecified: Secondary | ICD-10-CM | POA: Diagnosis present

## 2015-11-08 DIAGNOSIS — I10 Essential (primary) hypertension: Secondary | ICD-10-CM | POA: Diagnosis present

## 2015-11-08 DIAGNOSIS — I7300 Raynaud's syndrome without gangrene: Secondary | ICD-10-CM | POA: Diagnosis present

## 2015-11-08 DIAGNOSIS — R7301 Impaired fasting glucose: Secondary | ICD-10-CM | POA: Diagnosis not present

## 2015-11-08 DIAGNOSIS — Z85828 Personal history of other malignant neoplasm of skin: Secondary | ICD-10-CM | POA: Diagnosis not present

## 2015-11-08 DIAGNOSIS — K219 Gastro-esophageal reflux disease without esophagitis: Secondary | ICD-10-CM | POA: Diagnosis present

## 2015-11-08 DIAGNOSIS — K1121 Acute sialoadenitis: Secondary | ICD-10-CM | POA: Diagnosis present

## 2015-11-08 DIAGNOSIS — I4891 Unspecified atrial fibrillation: Principal | ICD-10-CM

## 2015-11-08 DIAGNOSIS — E876 Hypokalemia: Secondary | ICD-10-CM | POA: Diagnosis present

## 2015-11-08 DIAGNOSIS — E119 Type 2 diabetes mellitus without complications: Secondary | ICD-10-CM | POA: Diagnosis present

## 2015-11-08 DIAGNOSIS — E7800 Pure hypercholesterolemia, unspecified: Secondary | ICD-10-CM | POA: Diagnosis present

## 2015-11-08 DIAGNOSIS — Z8249 Family history of ischemic heart disease and other diseases of the circulatory system: Secondary | ICD-10-CM | POA: Diagnosis not present

## 2015-11-08 DIAGNOSIS — Z833 Family history of diabetes mellitus: Secondary | ICD-10-CM | POA: Diagnosis not present

## 2015-11-08 LAB — MRSA PCR SCREENING: MRSA by PCR: NEGATIVE

## 2015-11-08 LAB — CBC WITH DIFFERENTIAL/PLATELET
Basophils Absolute: 0 10*3/uL (ref 0.0–0.1)
Basophils Relative: 0 %
EOS PCT: 0 %
Eosinophils Absolute: 0 10*3/uL (ref 0.0–0.7)
HCT: 36.7 % (ref 36.0–46.0)
Hemoglobin: 13.3 g/dL (ref 12.0–15.0)
LYMPHS ABS: 1 10*3/uL (ref 0.7–4.0)
LYMPHS PCT: 10 %
MCH: 33.3 pg (ref 26.0–34.0)
MCHC: 36.2 g/dL — ABNORMAL HIGH (ref 30.0–36.0)
MCV: 91.8 fL (ref 78.0–100.0)
MONO ABS: 0.7 10*3/uL (ref 0.1–1.0)
Monocytes Relative: 7 %
Neutro Abs: 8.2 10*3/uL — ABNORMAL HIGH (ref 1.7–7.7)
Neutrophils Relative %: 83 %
PLATELETS: 275 10*3/uL (ref 150–400)
RBC: 4 MIL/uL (ref 3.87–5.11)
RDW: 11.8 % (ref 11.5–15.5)
WBC: 10 10*3/uL (ref 4.0–10.5)

## 2015-11-08 LAB — URINALYSIS, ROUTINE W REFLEX MICROSCOPIC
Bilirubin Urine: NEGATIVE
GLUCOSE, UA: NEGATIVE mg/dL
HGB URINE DIPSTICK: NEGATIVE
Leukocytes, UA: NEGATIVE
Nitrite: NEGATIVE
PROTEIN: NEGATIVE mg/dL
Specific Gravity, Urine: <1.005 — ABNORMAL LOW (ref 1.005–1.030)
pH: 7 (ref 5.0–8.0)

## 2015-11-08 LAB — COMPREHENSIVE METABOLIC PANEL
ALBUMIN: 4.2 g/dL (ref 3.5–5.0)
ALT: 17 U/L (ref 14–54)
AST: 18 U/L (ref 15–41)
Alkaline Phosphatase: 56 U/L (ref 38–126)
Anion gap: 14 (ref 5–15)
BUN: 10 mg/dL (ref 6–20)
CHLORIDE: 93 mmol/L — AB (ref 101–111)
CO2: 23 mmol/L (ref 22–32)
Calcium: 9.1 mg/dL (ref 8.9–10.3)
Creatinine, Ser: 0.68 mg/dL (ref 0.44–1.00)
GFR calc Af Amer: >60 mL/min (ref >60)
GFR calc non Af Amer: >60 mL/min (ref >60)
GLUCOSE: 162 mg/dL — AB (ref 65–99)
POTASSIUM: 3.3 mmol/L — AB (ref 3.5–5.1)
Sodium: 130 mmol/L — ABNORMAL LOW (ref 135–145)
Total Bilirubin: 0.9 mg/dL (ref 0.3–1.2)
Total Protein: 6.8 g/dL (ref 6.5–8.1)

## 2015-11-08 LAB — TROPONIN I: Troponin I: <0.03 ng/mL (ref <0.03)

## 2015-11-08 LAB — HEPARIN LEVEL (UNFRACTIONATED): Heparin Unfractionated: 0.41 IU/mL (ref 0.30–0.70)

## 2015-11-08 LAB — GLUCOSE, CAPILLARY
GLUCOSE-CAPILLARY: 156 mg/dL — AB (ref 65–99)
Glucose-Capillary: 135 mg/dL — ABNORMAL HIGH (ref 65–99)

## 2015-11-08 LAB — TSH: TSH: 1.471 u[IU]/mL (ref 0.350–4.500)

## 2015-11-08 LAB — PROTIME-INR
INR: 1.01
Prothrombin Time: 13.3 seconds (ref 11.4–15.2)

## 2015-11-08 LAB — APTT: aPTT: 30 seconds (ref 24–36)

## 2015-11-08 MED ORDER — SODIUM CHLORIDE 0.9 % IV SOLN
INTRAVENOUS | Status: DC
  Administered 2015-11-08 – 2015-11-10 (×3): via INTRAVENOUS

## 2015-11-08 MED ORDER — DILTIAZEM HCL 100 MG IV SOLR
5.0000 mg/h | INTRAVENOUS | Status: DC
  Administered 2015-11-08: 5 mg/h via INTRAVENOUS
  Administered 2015-11-08: 15 mg/h via INTRAVENOUS
  Administered 2015-11-08: 5 mg/h via INTRAVENOUS
  Administered 2015-11-09: 15 mg/h via INTRAVENOUS
  Filled 2015-11-08 (×3): qty 100

## 2015-11-08 MED ORDER — DOCUSATE SODIUM 100 MG PO CAPS
100.0000 mg | ORAL_CAPSULE | Freq: Every day | ORAL | Status: DC
  Administered 2015-11-09 – 2015-11-11 (×3): 100 mg via ORAL
  Filled 2015-11-08 (×3): qty 1

## 2015-11-08 MED ORDER — IOPAMIDOL (ISOVUE-300) INJECTION 61%
75.0000 mL | Freq: Once | INTRAVENOUS | Status: AC | PRN
Start: 2015-11-08 — End: 2015-11-08
  Administered 2015-11-08: 75 mL via INTRAVENOUS

## 2015-11-08 MED ORDER — ATORVASTATIN CALCIUM 40 MG PO TABS
40.0000 mg | ORAL_TABLET | Freq: Every day | ORAL | Status: DC
  Administered 2015-11-08 – 2015-11-10 (×3): 40 mg via ORAL
  Filled 2015-11-08 (×3): qty 1

## 2015-11-08 MED ORDER — PANTOPRAZOLE SODIUM 40 MG PO TBEC
40.0000 mg | DELAYED_RELEASE_TABLET | Freq: Every day | ORAL | Status: DC
  Administered 2015-11-09 – 2015-11-11 (×3): 40 mg via ORAL
  Filled 2015-11-08 (×3): qty 1

## 2015-11-08 MED ORDER — ASPIRIN 81 MG PO CHEW
81.0000 mg | CHEWABLE_TABLET | Freq: Every day | ORAL | Status: DC
  Administered 2015-11-09 – 2015-11-10 (×2): 81 mg via ORAL
  Filled 2015-11-08 (×4): qty 1

## 2015-11-08 MED ORDER — INSULIN ASPART 100 UNIT/ML ~~LOC~~ SOLN: 0.0000 [IU] | Freq: Every day | SUBCUTANEOUS | Status: DC

## 2015-11-08 MED ORDER — HEPARIN BOLUS VIA INFUSION
4000.0000 [IU] | Freq: Once | INTRAVENOUS | Status: AC
  Administered 2015-11-08: 4000 [IU] via INTRAVENOUS
  Filled 2015-11-08: qty 4000

## 2015-11-08 MED ORDER — SULFAMETHOXAZOLE-TRIMETHOPRIM 800-160 MG PO TABS
1.0000 | ORAL_TABLET | Freq: Two times a day (BID) | ORAL | Status: DC
  Administered 2015-11-08 – 2015-11-11 (×7): 1 via ORAL
  Filled 2015-11-08 (×7): qty 1

## 2015-11-08 MED ORDER — ENALAPRIL MALEATE 5 MG PO TABS
20.0000 mg | ORAL_TABLET | Freq: Every day | ORAL | Status: DC
  Administered 2015-11-09 – 2015-11-11 (×3): 20 mg via ORAL
  Filled 2015-11-08 (×3): qty 4

## 2015-11-08 MED ORDER — DILTIAZEM LOAD VIA INFUSION
15.0000 mg | Freq: Once | INTRAVENOUS | Status: AC
  Administered 2015-11-08: 15 mg via INTRAVENOUS
  Filled 2015-11-08: qty 15

## 2015-11-08 MED ORDER — ACETAMINOPHEN 325 MG PO TABS
650.0000 mg | ORAL_TABLET | ORAL | Status: DC | PRN
  Administered 2015-11-08 – 2015-11-09 (×2): 650 mg via ORAL
  Filled 2015-11-08: qty 2

## 2015-11-08 MED ORDER — INSULIN ASPART 100 UNIT/ML ~~LOC~~ SOLN
0.0000 [IU] | Freq: Three times a day (TID) | SUBCUTANEOUS | Status: DC
  Administered 2015-11-08 – 2015-11-11 (×4): 1 [IU] via SUBCUTANEOUS

## 2015-11-08 MED ORDER — ZOLPIDEM TARTRATE 5 MG PO TABS
10.0000 mg | ORAL_TABLET | Freq: Every evening | ORAL | Status: DC | PRN
  Administered 2015-11-08 – 2015-11-10 (×3): 10 mg via ORAL
  Filled 2015-11-08 (×3): qty 2

## 2015-11-08 MED ORDER — HEPARIN (PORCINE) IN NACL 100-0.45 UNIT/ML-% IJ SOLN
1100.0000 [IU]/h | INTRAMUSCULAR | Status: DC
  Administered 2015-11-08 – 2015-11-09 (×3): 1100 [IU]/h via INTRAVENOUS
  Filled 2015-11-08 (×3): qty 250

## 2015-11-08 NOTE — ED Triage Notes (Addendum)
Pt comes in with right sided facial and neck swelling that started on Friday afternoon. Pt states she was seen at urgent care yesterday and was given antibiotics and told to suck on hard candy. Pt states her face has been getting increasingly worse. Denies any shortness of breath or trouble breathing.

## 2015-11-08 NOTE — Progress Notes (Signed)
Pt states she is so tired and not able to rest here. ambien given for sleep after evening care done. Up to toilet voided x 1 with steady gait.

## 2015-11-08 NOTE — ED Notes (Signed)
HR continues to be 85-100

## 2015-11-08 NOTE — H&P (Signed)
History and Physical    Sara Terry [id number] DOB: 04/02/1945 DOA: 11/08/2015  PCP: Mickie Hillier, MD Patient coming from: Home.    Chief Complaint: Jaw pain.   HPI: Sara Terry is an 70 y.o. female with hx of HTN, glucose intolerance, but no prior stroke or PVD, presented to the ER with right angle of the jaw pain for past few days.  CT scan of the neck showed evidence of parotid swelling and likely parotitis with no mass, stone or lymphadenopathy.  She was noted to be in afib with RVR (CHAD2VAS2 of 3), and EKG confirmed this rhythm.  She is rather asymptomatic with no CP, SOB, lightlheadedness or any other symptomology.  She does drink about 2 glasses of wine daily.  Denied hx of GI bleed or tobacco use.    ED Course:  EKG showed afib with RVR, renal fx test is normal.  CXR showed cardiomegaly.  CT of the neck showed parotitis, and normal WBC.  K 3.3 and BS of 167.  Lipid in May 2017 showed LDL of 117 with HDL of 61.   Rewiew of Systems:  Constitutional: Negative for malaise, fever and chills. No significant weight loss or weight gain Eyes: Negative for eye pain, redness and discharge, diplopia, visual changes, or flashes of light. ENMT: Negative for ear pain, hoarseness, nasal congestion, sinus pressure and sore throat. No headaches; tinnitus, drooling, or problem swallowing. Cardiovascular: Negative for chest pain, palpitations, diaphoresis, dyspnea and peripheral edema. ; No orthopnea, PND Respiratory: Negative for cough, hemoptysis, wheezing and stridor. No pleuritic chestpain. Gastrointestinal: Negative for diarrhea, constipation,  melena, blood in stool, hematemesis, jaundice and rectal bleeding.    Genitourinary: Negative for frequency, dysuria, incontinence,flank pain and hematuria; Musculoskeletal: Negative for back pain and neck pain. Negative for swelling and trauma.;  Skin: . Negative for pruritus, rash, abrasions, bruising and skin lesion.; ulcerations Neuro: Negative for  headache, lightheadedness and neck stiffness. Negative for weakness, altered level of consciousness , altered mental status, extremity weakness, burning feet, involuntary movement, seizure and syncope.  Psych: negative for anxiety, depression, insomnia, tearfulness, panic attacks, hallucinations, paranoia, suicidal or homicidal ideation    Past Medical History:  Diagnosis Date  . Glucose intolerance (impaired glucose tolerance)   . Hypertension   . Insomnia   . Raynaud phenomenon   . Reflux   . Skin cancer    Forehead    Rewiew of Systems:  Constitutional: Negative for malaise, fever and chills. No significant weight loss or weight gain Eyes: Negative for eye pain, redness and discharge, diplopia, visual changes, or flashes of light. ENMT: Negative for ear pain, hoarseness, nasal congestion, sinus pressure and sore throat. No headaches; tinnitus, drooling, or problem swallowing. Some swelling of the right parotid gland.  Cardiovascular: Negative for chest pain, palpitations, diaphoresis, dyspnea and peripheral edema. ; No orthopnea, PND Respiratory: Negative for cough, hemoptysis, wheezing and stridor. No pleuritic chestpain. Gastrointestinal: Negative for nausea, vomiting, diarrhea, constipation, abdominal pain, melena, blood in stool, hematemesis, jaundice and rectal bleeding.    Genitourinary: Negative for frequency, dysuria, incontinence,flank pain and hematuria; Musculoskeletal: Negative for back pain and neck pain. Negative for swelling and trauma.;  Skin: . Negative for pruritus, rash, abrasions, bruising and skin lesion.; ulcerations Neuro: Negative for headache, lightheadedness and neck stiffness. Negative for weakness, altered level of consciousness , altered mental status, extremity weakness, burning feet, involuntary movement, seizure and syncope.  Psych: negative for anxiety, depression, insomnia, tearfulness, panic attacks, hallucinations, paranoia, suicidal or homicidal  ideation   Past Surgical History:  Procedure Laterality Date  . BREAST REDUCTION SURGERY  2003  . COLONOSCOPY    . COLONOSCOPY N/A 06/13/2013   Procedure: COLONOSCOPY;  Surgeon: Rogene Houston, MD;  Location: [facility];  Service: Endoscopy;  Laterality: N/A;  930  . TONSILLECTOMY    . TUBAL LIGATION       reports that she has never smoked. She has never used smokeless tobacco. She reports that she drinks about 1.2 oz of alcohol per week . She reports that she does not use drugs.  Allergies  Allergen Reactions  . Maxzide [Triamterene-Hctz]   . Penicillins Hives    Has patient had a PCN reaction causing immediate rash, facial/tongue/throat swelling, SOB or lightheadedness with hypotension: Yes Has patient had a PCN reaction causing severe rash involving mucus membranes or skin necrosis: No Has patient had a PCN reaction that required hospitalization No Has patient had a PCN reaction occurring within the last 10 years: No If all of the above answers are "NO", then may proceed with Cephalosporin use.     Family History  Problem Relation Age of Onset  . Hypertension Mother   . Hypertension Father   . Diabetes Father   . Heart attack Father   . Hypertension Brother      Prior to Admission medications   Medication Sig Start Date End Date Taking? Authorizing Provider  aspirin 81 MG tablet Take 81 mg by mouth daily.   Yes Historical Provider, MD  calcium carbonate (OS-CAL - DOSED IN MG OF ELEMENTAL CALCIUM) 1250 (500 Ca) MG tablet Take 1 tablet by mouth daily with breakfast.   Yes Historical Provider, MD  docusate sodium (COLACE) 100 MG capsule Take 100 mg by mouth daily.   Yes Historical Provider, MD  doxycycline (VIBRA-TABS) 100 MG tablet Take 1 tablet by mouth 2 (two) times daily. Starting 11/07/2015 x 10 days for swelling in face. 11/07/15  Yes Historical Provider, MD  enalapril (VASOTEC) 20 MG tablet Take 1 tablet (20 mg total) by mouth daily. 07/06/15  Yes Mikey Kirschner, MD    hydrochlorothiazide (HYDRODIURIL) 25 MG tablet TAKE (1) TABLET BY MOUTH EACH MORNING. 07/06/15  Yes Mikey Kirschner, MD  pantoprazole (PROTONIX) 40 MG tablet TAKE (1) TABLET BY MOUTH EACH MORNING. 07/06/15  Yes Mikey Kirschner, MD  zolpidem (AMBIEN) 10 MG tablet Take 1 tablet (10 mg total) by mouth at bedtime as needed for sleep. 07/06/15  Yes Mikey Kirschner, MD    Physical Exam: Vitals:   11/08/15 1030 11/08/15 1100 11/08/15 1200 11/08/15 1230  BP: 150/76 101/78 (!) 151/105 (!) 140/108  Pulse: 100 105 94 106  Resp: 23 17 16 18   Temp:      TempSrc:      SpO2: 99% 98% 99% 98%  Weight:      Height:          Constitutional: NAD, calm, comfortable Vitals:   11/08/15 1030 11/08/15 1100 11/08/15 1200 11/08/15 1230  BP: 150/76 101/78 (!) 151/105 (!) 140/108  Pulse: 100 105 94 106  Resp: 23 17 16 18   Temp:      TempSrc:      SpO2: 99% 98% 99% 98%  Weight:      Height:       Eyes: PERRL, lids and conjunctivae normal ENMT: Mucous membranes are moist. Posterior pharynx clear of any exudate or lesions.Normal dentition.  Neck: swelling of the right carotid gland, supple, no masses, no thyromegaly  Respiratory: clear to auscultation bilaterally, no wheezing, no crackles. Normal respiratory effort. No accessory muscle use.  Cardiovascular: Regular rate and rhythm, no murmurs / rubs / gallops. No extremity edema. 2+ pedal pulses. No carotid bruits.  Abdomen: no tenderness, no masses palpated. No hepatosplenomegaly. Bowel sounds positive.  Musculoskeletal: no clubbing / cyanosis. No joint deformity upper and lower extremities. Good ROM, no contractures. Normal muscle tone.  Skin: no rashes, lesions, ulcers. No induration Neurologic: CN 2-12 grossly intact. Sensation intact, DTR normal. Strength 5/5 in all 4.  Psychiatric: Normal judgment and insight. Alert and oriented x 3. Normal mood.    CBC:  Recent Labs Lab 11/08/15 0840  WBC 10.0  NEUTROABS 8.2*  HGB 13.3  HCT 36.7  MCV 91.8   PLT 123XX123   Basic Metabolic Panel:  Recent Labs Lab 11/08/15 0840  NA 130*  K 3.3*  CL 93*  CO2 23  GLUCOSE 162*  BUN 10  CREATININE 0.68  CALCIUM 9.1   GFR: Estimated Creatinine Clearance: 72.8 mL/min (by C-G formula based on SCr of 0.8 mg/dL). Liver Function Tests:  Recent Labs Lab 11/08/15 0840  AST 18  ALT 17  ALKPHOS 56  BILITOT 0.9  PROT 6.8  ALBUMIN 4.2   Coagulation Profile:  Recent Labs Lab 11/08/15 0843  INR 1.01   Cardiac Enzymes:  Recent Labs Lab 11/08/15 0840  TROPONINI <0.03   Radiological Exams on Admission: Ct Soft Tissue Neck W Contrast  Result Date: 11/08/2015 CLINICAL DATA:  Increasing right-sided facial and neck swelling beginning 2 days ago. EXAM: CT NECK WITH CONTRAST TECHNIQUE: Multidetector CT imaging of the neck was performed using the standard protocol following the bolus administration of intravenous contrast. CONTRAST:  15mL ISOVUE-300 IOPAMIDOL (ISOVUE-300) INJECTION 61% COMPARISON:  None. FINDINGS: Pharynx and larynx: Diffuse right parapharyngeal space inflammatory change and unorganized fluid with inflammation extending inferiorly in the neck in the carotid and visceral spaces. There is distortion of the right side of the oropharynx. The airway remains patent. Edema involves the right-sided epiglottis and aryepiglottic fold. No pharyngeal or laryngeal mass identified. Retropharyngeal edema and small retropharyngeal effusion. Salivary glands: Both submandibular glands are prominent in size, however the right is larger and appears edematous with surrounding submandibular space inflammation. There is marked diffuse asymmetric enlargement of the right parotid gland with patchy areas of hyperenhancement, most notably in the tail and deep lobe. Evaluation of the parotid glands (and evaluation for ductal stones) is limited by metallic dental streak artifact. There is prominent right periparotid inflammatory change which extends inferiorly  throughout the right facial soft tissues and into the right neck. There is thickening of the platysma on the right. Thyroid: Multiple hypoattenuating thyroid nodules, with the largest measuring 11 mm in the isthmus. Lymph nodes: Multiple small right level II lymph nodes measure up to 7 mm in short axis and are likely reactive. Vascular: Major vascular structures of the neck appear patent. Atherosclerotic plaque is noted at the right greater than left carotid bifurcations without significant stenosis. Limited intracranial: Unremarkable. Visualized orbits: Not imaged. Mastoids and visualized paranasal sinuses: Partially visualized moderate volume secretions in the right maxillary sinus with mild mucosal thickening. Visualized mastoid air cells are clear. Skeleton: Cervical disc degeneration, advanced at C6-7. Advanced left facet arthrosis at C3-4 with grade 1 anterolisthesis. Moderate atlantodental degenerative changes. Upper chest: No consolidation or mass in the visualized lung apices. IMPRESSION: 1. Marked enlargement and patchy hyperenhancement of the right parotid gland compatible with acute parotitis. Extensive inflammation throughout the  right face and neck including right parapharyngeal space with edema in the epiglottis and aryepiglottic fold on the right. Patent airway. No abscess. 2. Right submandibular gland sialadenitis. Electronically Signed   By: Logan Bores M.D.   On: 11/08/2015 11:24   Dg Chest Portable 1 View  Result Date: 11/08/2015 CLINICAL DATA:  New onset atrial for ablation. EXAM: PORTABLE CHEST 1 VIEW COMPARISON:  None. FINDINGS: The heart size is enlarged. Mediastinal contour is normal. There is no focal infiltrate, pulmonary edema, or pleural effusion. The visualized skeletal structures are unremarkable. IMPRESSION: No active cardial pulmonary disease.  Cardiomegaly. Electronically Signed   By: Abelardo Diesel M.D.   On: 11/08/2015 09:03    EKG: Independently reviewed.    Assessment/Plan Principal Problem:   New onset a-fib (HCC) Active Problems:   HLD (hyperlipidemia)   Essential hypertension, benign   Impaired fasting glucose    PLAN:   New onset of afib:  I will admit her to SDU, and continue with IV cardiazem today.  Tomorrow, will change to oral CCB, likely.  She has high risk CHAD2VAS2 score of 3, and we will start her on IV Heparin.  R and B explained.   She agreed.  Will consult cardiology to see if TEE or anticoagulation longer term in consideration for cardioversion.   In the interim, anticoagulate and rate control is the goal.  I have not ordered a transthoracic ECHO.  She was encourage to cut down or stop alcohol.  Check TSH and supplement her K.  HypoK:  Supplement.  Parotitis:  Give lemon drop or sour candies if we have it.  Give oral Bactrim DS, since she is allergic to PCN.  If not better, consider a sialogram, but I didn't feel any mass at her parotid opening, and CT was negative for any mass.   HLD:  Will give her statin, given borderline DM, HTN, and follow up.  DM:  Will check GlycoHb.    HTN:  Continue with ACE I for now.  If BP drops, will d/c this and continue with CCB or BB.   DVT prophylaxis: FULL IV Heparin. Code Status: FULL CODE.  Family Communication: None.  Disposition Plan: To home when appropriate.  Consults called: None yet.  Needs cardiology to see her.  Admission status: Inpatient.    Langley Ingalls MD FACP. [facility]  If 7PM-7AM, please contact night-coverage [email or web address] Password [email or web address]  11/08/2015, 1:11 PM

## 2015-11-08 NOTE — ED Provider Notes (Signed)
[facility] DEPT Provider Note   CSN: [id number] Arrival date & time: 11/08/15  0753     History   Chief Complaint Chief Complaint  Patient presents with  . Facial Swelling    HPI Sara Terry is a 70 y.o. female.  Patient is a 70 year old female who presents to the emergency department with a complaint of right facial swelling.  The patient states that on Friday, September 1 she noticed swelling and some mild discomfort of the right face and the area just under her right lower jaw. She went to the urgent care physicians and was diagnosed with a problem with her parotid gland. She was told to use lemon sours, and and was started on an antibiotic. She was told to come to the emergency room if the condition got worse. Today her swelling was worse, and she felt as though that she could at times feels something in her throat with swallowing. She presents now to the emergency department for additional evaluation. The patient has not had any problems with shortness of breath on, she's not had any fever or chills. She's not had any difficulty with breathing reported. Nothing makes the symptoms better, and nothing makes them worse.      Past Medical History:  Diagnosis Date  . Glucose intolerance (impaired glucose tolerance)   . Hypertension   . Insomnia   . Raynaud phenomenon   . Reflux   . Skin cancer    Forehead    Patient Active Problem List   Diagnosis Date Noted  . Other and unspecified hyperlipidemia 10/26/2012  . Essential hypertension, benign 10/26/2012  . Impaired fasting glucose 10/26/2012  . Insomnia 10/26/2012    Past Surgical History:  Procedure Laterality Date  . BREAST REDUCTION SURGERY  2003  . COLONOSCOPY    . COLONOSCOPY N/A 06/13/2013   Procedure: COLONOSCOPY;  Surgeon: Rogene Houston, MD;  Location: [facility];  Service: Endoscopy;  Laterality: N/A;  930  . TONSILLECTOMY    . TUBAL LIGATION      OB History    No data available       Home  Medications    Prior to Admission medications   Medication Sig Start Date End Date Taking? Authorizing Provider  aspirin 81 MG tablet Take 81 mg by mouth daily.    Historical Provider, MD  docusate sodium (COLACE) 100 MG capsule Take 100 mg by mouth daily.    Historical Provider, MD  enalapril (VASOTEC) 20 MG tablet Take 1 tablet (20 mg total) by mouth daily. 07/06/15   Mikey Kirschner, MD  hydrochlorothiazide (HYDRODIURIL) 25 MG tablet TAKE (1) TABLET BY MOUTH EACH MORNING. 07/06/15   Mikey Kirschner, MD  pantoprazole (PROTONIX) 40 MG tablet TAKE (1) TABLET BY MOUTH EACH MORNING. 07/06/15   Mikey Kirschner, MD  zolpidem (AMBIEN) 10 MG tablet Take 1 tablet (10 mg total) by mouth at bedtime as needed for sleep. 07/06/15   Mikey Kirschner, MD    Family History Family History  Problem Relation Age of Onset  . Hypertension Mother   . Hypertension Father   . Diabetes Father   . Heart attack Father   . Hypertension Brother     Social History Social History  Substance Use Topics  . Smoking status: Never Smoker  . Smokeless tobacco: Never Used  . Alcohol use 1.2 oz/week    2 Glasses of wine per week     Allergies   Maxzide [triamterene-hctz] and Penicillins  Review of Systems Review of Systems  Constitutional: Negative for activity change.       All ROS Neg except as noted in HPI  HENT: Negative for nosebleeds.        Facial swelling  Eyes: Negative for photophobia and discharge.  Respiratory: Negative for cough, shortness of breath and wheezing.   Cardiovascular: Negative for chest pain and palpitations.  Gastrointestinal: Negative for abdominal pain and blood in stool.  Genitourinary: Negative for dysuria, frequency and hematuria.  Musculoskeletal: Negative for arthralgias, back pain and neck pain.  Skin: Negative.   Neurological: Negative for dizziness, seizures and speech difficulty.  Psychiatric/Behavioral: Negative for confusion and hallucinations.     Physical  Exam Updated Vital Signs BP 145/94 (BP Location: Left Arm)   Pulse (!) 127   Temp 97.7 F (36.5 C) (Oral)   Resp 18   Ht 5\' 6"  (1.676 m)   Wt 84.8 kg   SpO2 96%   BMI 30.18 kg/m   Physical Exam  Constitutional: She is oriented to person, place, and time. She appears well-developed and well-nourished.  Non-toxic appearance.  HENT:  Head: Normocephalic.  Right Ear: Tympanic membrane and external ear normal.  Left Ear: Tympanic membrane and external ear normal.  There is a swollen area just under the right earlobe extending under the right jaw and into the right neck. The area is warm to touch, and mildly tender, but not hot.  Eyes: EOM and lids are normal. Pupils are equal, round, and reactive to light.  Neck: Normal range of motion. Neck supple. Carotid bruit is not present.  Trachea is in the midline. There is a swollen area extending from the right lower jaw into the neck, there is no stridor appreciated.  Cardiovascular: Normal heart sounds, intact distal pulses and normal pulses.  An irregularly irregular rhythm present. Tachycardia present.   Pulmonary/Chest: Breath sounds normal. No respiratory distress.  Abdominal: Soft. Bowel sounds are normal. There is no tenderness. There is no guarding.  Musculoskeletal: Normal range of motion.  No edema of the lower extremities. Negative Homans sign.  Lymphadenopathy:       Head (right side): No submandibular adenopathy present.       Head (left side): No submandibular adenopathy present.    She has no cervical adenopathy.  Neurological: She is alert and oriented to person, place, and time. She has normal strength. No cranial nerve deficit or sensory deficit.  Skin: Skin is warm and dry.  Psychiatric: She has a normal mood and affect. Her speech is normal.  Nursing note and vitals reviewed.    ED Treatments / Results  Labs (all labs ordered are listed, but only abnormal results are displayed) Labs Reviewed  COMPREHENSIVE  METABOLIC PANEL  CBC WITH DIFFERENTIAL/PLATELET  URINALYSIS, ROUTINE W REFLEX MICROSCOPIC (NOT AT [facility])  TROPONIN I  APTT  PROTIME-INR    EKG  EKG Interpretation  Date/Time:  Sunday November 08 2015 08:08:19 EDT Ventricular Rate:  126 PR Interval:    QRS Duration: 93 QT Interval:  310 QTC Calculation: 427 R Axis:   18 Text Interpretation:  Atrial fibrillation Low voltage, precordial leads RSR' in V1 or V2, right VCD or RVH Abnormal inferior Q waves Nonspecific repol abnormality, lateral leads Baseline wander in lead(s) V3 Confirmed by ZAMMIT  MD, JOSEPH ([id number]) on 11/08/2015 8:14:05 AM       Radiology No results found.  Procedures Procedures (including critical care time) CRITICAL CARE Performed by: Lenox Ahr Total critical care time: **  40* minutes Critical care time was exclusive of separately billable procedures and treating other patients. Critical care was necessary to treat or prevent imminent or life-threatening deterioration. Critical care was time spent personally by me on the following activities: development of treatment plan with patient and/or surrogate as well as nursing, discussions with consultants, evaluation of patient's response to treatment, examination of patient, obtaining history from patient or surrogate, ordering and performing treatments and interventions, ordering and review of laboratory studies, ordering and review of radiographic studies, pulse oximetry and re-evaluation of patient's condition. Medications Ordered in ED Medications  diltiazem (CARDIZEM) 1 mg/mL load via infusion 15 mg (not administered)    And  diltiazem (CARDIZEM) 100 mg in dextrose 5 % 100 mL (1 mg/mL) infusion (not administered)     Initial Impression / Assessment and Plan / ED Course  Upon completing the vital signs, nursing staff noted the patient to be in an irregularly irregular rhythm on. The patient gave history that she has no history of cardiac dysrhythmias on.  Electrocardiogram was obtained, and found to show an atrial fibrillation with a rate of 126 . Blood pressure remains stable at 145/94.  Will obtain blood work including troponin, to rule out any additional cardiac issues. Patient will receive a complete blood count further evaluate the facial swelling and enlargement of glands on the right side of the face and neck. Will obtain rate control with diltiazem.  I have reviewed the triage vital signs and the nursing notes.  Pertinent labs & imaging results that were available during my care of the patient were reviewed by me and considered in my medical decision making (see chart for details).  Clinical Course    *I have reviewed nursing notes, vital signs, and all appropriate lab and imaging results for this patient.**  Final Clinical Impressions(s) / ED Diagnoses  Pt heart rate 80 to 93 on diltiazem. No c/o chest pain or dizziness. Troponin wnl at <0.03. Potassium 3.3 oral potassium given. Wbc wnl. CT neck ordered. 9:51am Pt resting without problem. Heart rate 80-93. B/P [id number] systolic.  10:48 am Pt reading a book. No distress. Heart rate under good control. No c/o chest pain or tightness.   1109 am Discussed findings of CT with pt. She remains comfortable.  12:27pm  Case discussed with Dr Truman Hayward. He will see pt in the ED.   Final diagnoses:  None    New Prescriptions New Prescriptions   No medications on file     Lily Kocher, PA-C 11/09/15 0920    Milton Ferguson, MD 11/11/15 1023

## 2015-11-08 NOTE — ED Notes (Signed)
Pt hear rate between 85-90

## 2015-11-08 NOTE — Progress Notes (Signed)
ANTICOAGULATION CONSULT NOTE - Initial Consult  Pharmacy Consult for Heparin Indication: atrial fibrillation  Allergies  Allergen Reactions  . Maxzide [Triamterene-Hctz]   . Penicillins Hives    Has patient had a PCN reaction causing immediate rash, facial/tongue/throat swelling, SOB or lightheadedness with hypotension: Yes Has patient had a PCN reaction causing severe rash involving mucus membranes or skin necrosis: No Has patient had a PCN reaction that required hospitalization No Has patient had a PCN reaction occurring within the last 10 years: No If all of the above answers are "NO", then may proceed with Cephalosporin use.     Patient Measurements: Height: 5\' 6"  (167.6 cm) Weight: 187 lb (84.8 kg) IBW/kg (Calculated) : 59.3 HEPARIN DW (KG): 77.3   Vital Signs: Temp: 97.7 F (36.5 C) (09/03 0801) Temp Source: Oral (09/03 0801) BP: 141/87 (09/03 1300) Pulse Rate: 116 (09/03 1300)  Labs:  Recent Labs  11/08/15 0840 11/08/15 0843  HGB 13.3  --   HCT 36.7  --   PLT 275  --   APTT  --  30  LABPROT  --  13.3  INR  --  1.01  CREATININE 0.68  --   TROPONINI <0.03  --     Estimated Creatinine Clearance: 72.8 mL/min (by C-G formula based on SCr of 0.8 mg/dL).   Medical History: Past Medical History:  Diagnosis Date  . Glucose intolerance (impaired glucose tolerance)   . Hypertension   . Insomnia   . Raynaud phenomenon   . Reflux   . Skin cancer    Forehead    Medications:  Prescriptions Prior to Admission  Medication Sig Dispense Refill Last Dose  . aspirin 81 MG tablet Take 81 mg by mouth daily.   11/08/2015 at Unknown time  . calcium carbonate (OS-CAL - DOSED IN MG OF ELEMENTAL CALCIUM) 1250 (500 Ca) MG tablet Take 1 tablet by mouth daily with breakfast.   11/08/2015 at Unknown time  . docusate sodium (COLACE) 100 MG capsule Take 100 mg by mouth daily.   11/08/2015 at Unknown time  . doxycycline (VIBRA-TABS) 100 MG tablet Take 1 tablet by mouth 2 (two) times  daily. Starting 11/07/2015 x 10 days for swelling in face.   11/08/2015 at Unknown time  . enalapril (VASOTEC) 20 MG tablet Take 1 tablet (20 mg total) by mouth daily. 30 tablet 5 11/08/2015 at Unknown time  . hydrochlorothiazide (HYDRODIURIL) 25 MG tablet TAKE (1) TABLET BY MOUTH EACH MORNING. 30 tablet 5 11/08/2015 at Unknown time  . pantoprazole (PROTONIX) 40 MG tablet TAKE (1) TABLET BY MOUTH EACH MORNING. 30 tablet 5 11/08/2015 at Unknown time  . zolpidem (AMBIEN) 10 MG tablet Take 1 tablet (10 mg total) by mouth at bedtime as needed for sleep. 30 tablet 5 unknown    Assessment: 70  Yo female presents to ED with jaw pain over past few days. CT scan of neck showed  evidence of parotid swelling and likely parotitis with no mass, stone or lymphadenopathy.  She was noted to be in afib with RVR (CHAD2VAS2 of 3), and EKG confirmed this rhythm.  She is rather asymptomatic with no CP, SOB, lightlheadedness or any other symptomology. Start IV heparin for afib  Goal of Therapy:  Heparin level 0.3-0.7 units/ml Monitor platelets by anticoagulation protocol: Yes   Plan:  Give 4000 units bolus x 1 Start heparin infusion at 1100 units/hr Check anti-Xa level in 6-8 hours and daily while on heparin Continue to monitor H&H and platelets   Doneen Ollinger  Trenton Gammon, BS [place], BCPS Clinical Pharmacist Pager [phone]  11/08/2015,1:53 PM

## 2015-11-08 NOTE — ED Notes (Signed)
Pt ambulated to the bathroom. Pt made an unsuccessful attempt to collect urine.    HR is now 105-120 on monitor. Nurse will re-eval pt in 10 minutes.

## 2015-11-08 NOTE — ED Notes (Signed)
MD at bedside. 

## 2015-11-09 LAB — CBC
HCT: 32.8 % — ABNORMAL LOW (ref 36.0–46.0)
Hemoglobin: 11.6 g/dL — ABNORMAL LOW (ref 12.0–15.0)
MCH: 32.9 pg (ref 26.0–34.0)
MCHC: 35.4 g/dL (ref 30.0–36.0)
MCV: 92.9 fL (ref 78.0–100.0)
Platelets: 227 10*3/uL (ref 150–400)
RBC: 3.53 MIL/uL — ABNORMAL LOW (ref 3.87–5.11)
RDW: 12 % (ref 11.5–15.5)
WBC: 9.1 10*3/uL (ref 4.0–10.5)

## 2015-11-09 LAB — GLUCOSE, CAPILLARY
GLUCOSE-CAPILLARY: 130 mg/dL — AB (ref 65–99)
Glucose-Capillary: 140 mg/dL — ABNORMAL HIGH (ref 65–99)
Glucose-Capillary: 126 mg/dL — ABNORMAL HIGH (ref 65–99)
Glucose-Capillary: 172 mg/dL — ABNORMAL HIGH (ref 65–99)

## 2015-11-09 LAB — HEPARIN LEVEL (UNFRACTIONATED): HEPARIN UNFRACTIONATED: 0.46 [IU]/mL (ref 0.30–0.70)

## 2015-11-09 MED ORDER — DILTIAZEM HCL 60 MG PO TABS
60.0000 mg | ORAL_TABLET | Freq: Four times a day (QID) | ORAL | Status: DC
  Administered 2015-11-09 – 2015-11-11 (×9): 60 mg via ORAL
  Filled 2015-11-09 (×9): qty 1

## 2015-11-09 MED ORDER — SODIUM CHLORIDE 0.9 % IV BOLUS (SEPSIS)
500.0000 mL | Freq: Once | INTRAVENOUS | Status: AC
  Administered 2015-11-09: 500 mL via INTRAVENOUS

## 2015-11-09 NOTE — Progress Notes (Signed)
ANTICOAGULATION CONSULT NOTE -   Pharmacy Consult for Heparin Indication: atrial fibrillation  Allergies  Allergen Reactions  . Maxzide [Triamterene-Hctz]   . Penicillins Hives    Has patient had a PCN reaction causing immediate rash, facial/tongue/throat swelling, SOB or lightheadedness with hypotension: Yes Has patient had a PCN reaction causing severe rash involving mucus membranes or skin necrosis: No Has patient had a PCN reaction that required hospitalization No Has patient had a PCN reaction occurring within the last 10 years: No If all of the above answers are "NO", then may proceed with Cephalosporin use.     Patient Measurements: Height: 5\' 6"  (167.6 cm) Weight: 185 lb 6.5 oz (84.1 kg) IBW/kg (Calculated) : 59.3 HEPARIN DW (KG): 77.3   Vital Signs: Temp: 97.4 F (36.3 C) (09/04 0803) Temp Source: Oral (09/04 0803) BP: 115/73 (09/04 0700) Pulse Rate: 87 (09/04 0700)  Labs:  Recent Labs  11/08/15 0840 11/08/15 0843 11/08/15 2134 11/09/15 0512  HGB 13.3  --   --  11.6*  HCT 36.7  --   --  32.8*  PLT 275  --   --  227  APTT  --  30  --   --   LABPROT  --  13.3  --   --   INR  --  1.01  --   --   HEPARINUNFRC  --   --  0.41 0.46  CREATININE 0.68  --   --   --   TROPONINI <0.03  --   --   --     Estimated Creatinine Clearance: 72.5 mL/min (by C-G formula based on SCr of 0.8 mg/dL).   Medical History: Past Medical History:  Diagnosis Date  . Glucose intolerance (impaired glucose tolerance)   . Hypertension   . Insomnia   . Raynaud phenomenon   . Reflux   . Skin cancer    Forehead    Medications:  Prescriptions Prior to Admission  Medication Sig Dispense Refill Last Dose  . aspirin 81 MG tablet Take 81 mg by mouth daily.   11/08/2015 at Unknown time  . calcium carbonate (OS-CAL - DOSED IN MG OF ELEMENTAL CALCIUM) 1250 (500 Ca) MG tablet Take 1 tablet by mouth daily with breakfast.   11/08/2015 at Unknown time  . docusate sodium (COLACE) 100 MG  capsule Take 100 mg by mouth daily.   11/08/2015 at Unknown time  . doxycycline (VIBRA-TABS) 100 MG tablet Take 1 tablet by mouth 2 (two) times daily. Starting 11/07/2015 x 10 days for swelling in face.   11/08/2015 at Unknown time  . enalapril (VASOTEC) 20 MG tablet Take 1 tablet (20 mg total) by mouth daily. 30 tablet 5 11/08/2015 at Unknown time  . hydrochlorothiazide (HYDRODIURIL) 25 MG tablet TAKE (1) TABLET BY MOUTH EACH MORNING. 30 tablet 5 11/08/2015 at Unknown time  . pantoprazole (PROTONIX) 40 MG tablet TAKE (1) TABLET BY MOUTH EACH MORNING. 30 tablet 5 11/08/2015 at Unknown time  . zolpidem (AMBIEN) 10 MG tablet Take 1 tablet (10 mg total) by mouth at bedtime as needed for sleep. 30 tablet 5 unknown    Assessment: 70  Yo female presents to ED with jaw pain over past few days. CT scan of neck showed  evidence of parotid swelling and likely parotitis with no mass, stone or lymphadenopathy.  She was noted to be in afib with RVR (CHAD2VAS2 of 3), and EKG confirmed this rhythm.  She is rather asymptomatic with no CP, SOB, lightlheadedness or any other  symptomology. Start IV heparin for afib. HL is therapeutic( 2 consecutive levels)  Goal of Therapy:  Heparin level 0.3-0.7 units/ml Monitor platelets by anticoagulation protocol: Yes   Plan:   Continue heparin infusion at 1100 units/hr Check anti-Xa level daily while on heparin Continue to monitor H&H and platelets  Isac Sarna, BS [place], BCPS Clinical Pharmacist Pager [phone]  11/09/2015,8:40 AM

## 2015-11-09 NOTE — Progress Notes (Signed)
PROGRESS NOTE    Sara Terry  [id number] DOB: [birth year] DOA: 11/08/2015 PCP: Mickie Hillier, MD    Brief Narrative: patient was admitted into the ICU on Sept 3, for new onset of afib and parotitis, and was started on IV Cardiazem and IV Heparin.  She was given Bactrim for her parotitis.  Her HR has been controlled, but she remained in afib.  She has no other complaints.  She spiked fever to 101-102, and BCs were sent.  CXR showed no infiltrate, and UA was negative.     Assessment & Plan:   Principal Problem:   New onset a-fib ([place]) Active Problems:   HLD (hyperlipidemia)   Essential hypertension, benign   Impaired fasting glucose   1. New onset of Afib, CHADSVASC 3, TSH is normal.  Decrease alcohol use encouraged.  Will continue with IV Heparin for now.  Will change IV Cardiazem to oral cardiazem.  No TTE ordered, awaiting cardiology consultation to see if TEE and cardioversion is desired.  Will keep in ICU as needed for today.    2.   Right parotitis:  Continue with Bactrim.  Check BC.  No stone, mass, or lymphadenopathy seen in CT of the neck.  3.   HLD Continue with statin.   4.  DM.  GlycoHb pending.  Carb modified diet.   DVT prophylaxis: FULL IV Heparin. Code Status: FULL CODE.  Family Communication: Husband. Disposition Plan: To home when appropriate.   Consultants:   Cardiology pending.   Procedures:   None.   Antimicrobials: Anti-infectives    Start     Dose/Rate Route Frequency Ordered Stop   11/08/15 1400  sulfamethoxazole-trimethoprim (BACTRIM DS,SEPTRA DS) 800-160 MG per tablet 1 tablet     1 tablet Oral Every 12 hours 11/08/15 1350         Subjective:  She is feeling better.    Objective: Vitals:   11/09/15 0300 11/09/15 0400 11/09/15 0500 11/09/15 0600  BP: 118/78 117/80 119/77 96/65  Pulse: 90 88 81 74  Resp: 15 16 18 19   Temp:   98.6 F (37 C)   TempSrc:   Oral   SpO2: 96% 96% 96% 96%  Weight:   84.1 kg (185 lb 6.5 oz)   Height:         Intake/Output Summary (Last 24 hours) at 11/09/15 0745 Last data filed at 11/08/15 2039  Gross per 24 hour  Intake              240 ml  Output              200 ml  Net               40 ml   Filed Weights   11/08/15 0801 11/09/15 0500  Weight: 84.8 kg (187 lb) 84.1 kg (185 lb 6.5 oz)    Examination:  General exam: Appears calm and comfortable  Respiratory system: Clear to auscultation. Respiratory effort normal. Cardiovascular system: S1 & S2 heard, RRR. No JVD, murmurs, rubs, gallops or clicks. No pedal edema. Gastrointestinal system: Abdomen is nondistended, soft and nontender. No organomegaly or masses felt. Normal bowel sounds heard. Central nervous system: Alert and oriented. No focal neurological deficits. Extremities: Symmetric 5 x 5 power. Skin: No rashes, lesions or ulcers Psychiatry: Judgement and insight appear normal. Mood & affect appropriate.   Data Reviewed: I have personally reviewed following labs and imaging studies  CBC:  Recent Labs Lab 11/08/15 0840 11/09/15 0512  WBC  10.0 9.1  NEUTROABS 8.2*  --   HGB 13.3 11.6*  HCT 36.7 32.8*  MCV 91.8 92.9  PLT 275 [id number]   Basic Metabolic Panel:  Recent Labs Lab 11/08/15 0840  NA 130*  K 3.3*  CL 93*  CO2 23  GLUCOSE 162*  BUN 10  CREATININE 0.68  CALCIUM 9.1   GFR: Estimated Creatinine Clearance: 72.5 mL/min (by C-G formula based on SCr of 0.8 mg/dL). Liver Function Tests:  Recent Labs Lab 11/08/15 0840  AST 18  ALT 17  ALKPHOS 56  BILITOT 0.9  PROT 6.8  ALBUMIN 4.2   Coagulation Profile:  Recent Labs Lab 11/08/15 0843  INR 1.01   Cardiac Enzymes:  Recent Labs Lab 11/08/15 0840  TROPONINI <0.03   CBG:  Recent Labs Lab 11/08/15 1625 11/08/15 2224  GLUCAP 135* 156*   Thyroid Function Tests:  Recent Labs  11/08/15 0840  TSH 1.471   Sepsis Labs: No results for input(s): PROCALCITON, LATICACIDVEN in the last 168 hours.  Recent Results (from the past 240 hour(s))    MRSA PCR Screening     Status: None   Collection Time: 11/08/15  2:40 PM  Result Value Ref Range Status   MRSA by PCR NEGATIVE NEGATIVE Final    Comment:        The GeneXpert MRSA Assay (FDA approved for NASAL specimens only), is one component of a comprehensive MRSA colonization surveillance program. It is not intended to diagnose MRSA infection nor to guide or monitor treatment for MRSA infections.   Culture, blood (Routine X 2) w Reflex to ID Panel     Status: None (Preliminary result)   Collection Time: 11/08/15  4:39 PM  Result Value Ref Range Status   Specimen Description BLOOD LEFT HAND  Final   Special Requests BOTTLES DRAWN AEROBIC AND ANAEROBIC 4CC EACH  Final   Culture PENDING  Incomplete   Report Status PENDING  Incomplete  Culture, blood (Routine X 2) w Reflex to ID Panel     Status: None (Preliminary result)   Collection Time: 11/08/15  4:44 PM  Result Value Ref Range Status   Specimen Description BLOOD RIGHT HAND  Final   Special Requests BOTTLES DRAWN AEROBIC AND ANAEROBIC 4CC EACH  Final   Culture PENDING  Incomplete   Report Status PENDING  Incomplete     Radiology Studies: Ct Soft Tissue Neck W Contrast  Result Date: 11/08/2015 CLINICAL DATA:  Increasing right-sided facial and neck swelling beginning 2 days ago. EXAM: CT NECK WITH CONTRAST TECHNIQUE: Multidetector CT imaging of the neck was performed using the standard protocol following the bolus administration of intravenous contrast. CONTRAST:  74mL ISOVUE-300 IOPAMIDOL (ISOVUE-300) INJECTION 61% COMPARISON:  None. FINDINGS: Pharynx and larynx: Diffuse right parapharyngeal space inflammatory change and unorganized fluid with inflammation extending inferiorly in the neck in the carotid and visceral spaces. There is distortion of the right side of the oropharynx. The airway remains patent. Edema involves the right-sided epiglottis and aryepiglottic fold. No pharyngeal or laryngeal mass identified.  Retropharyngeal edema and small retropharyngeal effusion. Salivary glands: Both submandibular glands are prominent in size, however the right is larger and appears edematous with surrounding submandibular space inflammation. There is marked diffuse asymmetric enlargement of the right parotid gland with patchy areas of hyperenhancement, most notably in the tail and deep lobe. Evaluation of the parotid glands (and evaluation for ductal stones) is limited by metallic dental streak artifact. There is prominent right periparotid inflammatory change which extends inferiorly throughout  the right facial soft tissues and into the right neck. There is thickening of the platysma on the right. Thyroid: Multiple hypoattenuating thyroid nodules, with the largest measuring 11 mm in the isthmus. Lymph nodes: Multiple small right level II lymph nodes measure up to 7 mm in short axis and are likely reactive. Vascular: Major vascular structures of the neck appear patent. Atherosclerotic plaque is noted at the right greater than left carotid bifurcations without significant stenosis. Limited intracranial: Unremarkable. Visualized orbits: Not imaged. Mastoids and visualized paranasal sinuses: Partially visualized moderate volume secretions in the right maxillary sinus with mild mucosal thickening. Visualized mastoid air cells are clear. Skeleton: Cervical disc degeneration, advanced at C6-7. Advanced left facet arthrosis at C3-4 with grade 1 anterolisthesis. Moderate atlantodental degenerative changes. Upper chest: No consolidation or mass in the visualized lung apices. IMPRESSION: 1. Marked enlargement and patchy hyperenhancement of the right parotid gland compatible with acute parotitis. Extensive inflammation throughout the right face and neck including right parapharyngeal space with edema in the epiglottis and aryepiglottic fold on the right. Patent airway. No abscess. 2. Right submandibular gland sialadenitis. Electronically  Signed   By: Logan Bores M.D.   On: 11/08/2015 11:24   Dg Chest Portable 1 View  Result Date: 11/08/2015 CLINICAL DATA:  New onset atrial for ablation. EXAM: PORTABLE CHEST 1 VIEW COMPARISON:  None. FINDINGS: The heart size is enlarged. Mediastinal contour is normal. There is no focal infiltrate, pulmonary edema, or pleural effusion. The visualized skeletal structures are unremarkable. IMPRESSION: No active cardial pulmonary disease.  Cardiomegaly. Electronically Signed   By: Abelardo Diesel M.D.   On: 11/08/2015 09:03    Scheduled Meds: . aspirin  81 mg Oral Daily  . atorvastatin  40 mg Oral q1800  . docusate sodium  100 mg Oral Daily  . enalapril  20 mg Oral Daily  . insulin aspart  0-5 Units Subcutaneous QHS  . insulin aspart  0-9 Units Subcutaneous TID WC  . pantoprazole  40 mg Oral Daily  . sulfamethoxazole-trimethoprim  1 tablet Oral Q12H   Continuous Infusions: . sodium chloride 50 mL/hr at 11/08/15 1536  . diltiazem (CARDIZEM) infusion 15 mg/hr (11/09/15 0127)  . heparin 1,100 Units/hr (11/09/15 0128)     LOS: 1 day   Tonnia Bardin, MD FACP Hospitalist.   If 7PM-7AM, please contact night-coverage [email or web address] Password [email or web address] 11/09/2015, 7:45 AM

## 2015-11-09 NOTE — Plan of Care (Signed)
Problem: Cardiac: Goal: Ability to achieve and maintain adequate cardiopulmonary perfusion will improve Outcome: Progressing Switched to PO cardizem today.  Having some transient dizziness

## 2015-11-09 NOTE — Progress Notes (Signed)
MD  Schorr paged with temp of 102.2 pt medicated . cxs done earlier today with temp 101.8

## 2015-11-10 ENCOUNTER — Inpatient Hospital Stay (HOSPITAL_COMMUNITY): Payer: Medicare Other

## 2015-11-10 DIAGNOSIS — E785 Hyperlipidemia, unspecified: Secondary | ICD-10-CM

## 2015-11-10 DIAGNOSIS — I10 Essential (primary) hypertension: Secondary | ICD-10-CM

## 2015-11-10 DIAGNOSIS — I4891 Unspecified atrial fibrillation: Secondary | ICD-10-CM

## 2015-11-10 DIAGNOSIS — R7301 Impaired fasting glucose: Secondary | ICD-10-CM

## 2015-11-10 LAB — ECHOCARDIOGRAM COMPLETE
CHL CUP DOP CALC LVOT VTI: 20.4 cm
CHL CUP RV SYS PRESS: 28 mmHg
CHL CUP TV REG PEAK VELOCITY: 252 cm/s
E decel time: 222 msec
FS: 30 % (ref 28–44)
HEIGHTINCHES: 66 in
IV/PV OW: 1.13
LA vol A4C: 77.9 ml
LA vol index: 36.7 mL/m2
LA vol: 73.9 mL
LADIAMINDEX: 2.13 cm/m2
LASIZE: 43 mm
LEFT ATRIUM END SYS DIAM: 43 mm
LV PW d: 8.81 mm — AB (ref 0.6–1.1)
LV dias vol: 49 mL (ref 46–106)
LV sys vol index: 8 mL/m2
LV sys vol: 16 mL (ref 14–42)
LVDIAVOLIN: 24 mL/m2
LVOT area: 3.14 cm2
LVOT peak grad rest: 3 mmHg
LVOT peak vel: 85.7 cm/s
LVOTD: 20 mm
LVOTSV: 64 mL
Lateral S' vel: 10.8 cm/s
MV Dec: 222
MV Peak grad: 6 mmHg
MVPKEVEL: 118 m/s
RV TAPSE: 15.9 mm
Simpson's disk: 67
Stroke v: 32 ml
TRMAXVEL: 252 cm/s
WEIGHTICAEL: 2998.26 [oz_av]

## 2015-11-10 LAB — CBC
HEMATOCRIT: 30.4 % — AB (ref 36.0–46.0)
HEMOGLOBIN: 10.9 g/dL — AB (ref 12.0–15.0)
MCH: 33.1 pg (ref 26.0–34.0)
MCHC: 35.9 g/dL (ref 30.0–36.0)
MCV: 92.4 fL (ref 78.0–100.0)
Platelets: 223 10*3/uL (ref 150–400)
RBC: 3.29 MIL/uL — ABNORMAL LOW (ref 3.87–5.11)
RDW: 11.9 % (ref 11.5–15.5)
WBC: 7.3 10*3/uL (ref 4.0–10.5)

## 2015-11-10 LAB — GLUCOSE, CAPILLARY
GLUCOSE-CAPILLARY: 119 mg/dL — AB (ref 65–99)
GLUCOSE-CAPILLARY: 124 mg/dL — AB (ref 65–99)
Glucose-Capillary: 110 mg/dL — ABNORMAL HIGH (ref 65–99)
Glucose-Capillary: 118 mg/dL — ABNORMAL HIGH (ref 65–99)

## 2015-11-10 LAB — HEMOGLOBIN A1C
Hgb A1c MFr Bld: 5.7 % — ABNORMAL HIGH (ref 4.8–5.6)
Mean Plasma Glucose: 117 mg/dL

## 2015-11-10 LAB — HEPARIN LEVEL (UNFRACTIONATED): Heparin Unfractionated: 0.46 IU/mL (ref 0.30–0.70)

## 2015-11-10 LAB — MAGNESIUM: MAGNESIUM: 1.7 mg/dL (ref 1.7–2.4)

## 2015-11-10 MED ORDER — APIXABAN 5 MG PO TABS
5.0000 mg | ORAL_TABLET | Freq: Two times a day (BID) | ORAL | Status: DC
  Administered 2015-11-10 – 2015-11-11 (×3): 5 mg via ORAL
  Filled 2015-11-10 (×3): qty 1

## 2015-11-10 MED ORDER — POTASSIUM CHLORIDE CRYS ER 20 MEQ PO TBCR
20.0000 meq | EXTENDED_RELEASE_TABLET | Freq: Every day | ORAL | Status: DC
  Administered 2015-11-11: 20 meq via ORAL
  Filled 2015-11-10: qty 1

## 2015-11-10 MED ORDER — POTASSIUM CHLORIDE CRYS ER 20 MEQ PO TBCR
40.0000 meq | EXTENDED_RELEASE_TABLET | Freq: Two times a day (BID) | ORAL | Status: AC
  Administered 2015-11-10 (×2): 40 meq via ORAL
  Filled 2015-11-10 (×2): qty 2

## 2015-11-10 NOTE — Consult Note (Signed)
CARDIOLOGY CONSULT NOTE   Patient ID: KORINE ISGETT MRN: [id number] DOB/AGE: 05/31/1945 70 y.o.  Admit Date: 11/08/2015 Referring Physician: TRH-Lee Primary Physician: Mickie Hillier, MD Consulting Cardiologist: Kate Sable MD Primary Cardiologist: New  Reason for Consultation: New Onset Atrial fib  Clinical Summary Ms. Przybyszewski is a 70 y.o.female with No prior history of coronary disease, but with known history of hypertension, glucose intolerance, and GERD,  who presented to ER with complaints of jaw pain and found to be in atrial fib with RVR.   She states that symptoms began Saturday morning when she had some tenderness in her lower right jaw. She saw a physician at [facility] urgent care on [address]. She was started on antibiotic therapy and told to suck on sour candy. By the afternoon the swelling had worsened and on Sunday she was having trouble swallowing, with swelling into her chin and into her face. As a result of this she presented to the emergency room. Atrial fib with RVR was found incidentally on admission EKG. Patient was unaware of irregular heart rhythm or rapid heart rhythm. She denied chest pain dyspnea or dizziness associated.  On arrival BP 145/94, HR 74, O2 Sat 95%, afebrile. Na 130, potassium 3.3, Glucose 162, she was not found to be anemic EKG revealed atrial fib with RVR rate of 126 bpm, with non-specific changes. . CT found enlargement of the right parotid gland, compatible with acute parotitis with extensive inflammation in right face and neck. CXR negative for CHF or pneumonia. She was treated with diltiazem bolus and infusion. Do not see where potassium has been replaced. She was also started on bactrim for parotitis. She was started on heparin infusion. Has since been transitioned to oral diltiazem.   Currently she is comfortable, heart rate is better controlled on diltiazem 60 mg every 6 hours and she remains on IV heparin.  Of note, she is concerned about  impending hip surgery. She has to have right hip repair on 01/13/2016 through [facility]. There are questions concerning anticoagulation therapy and if this type of therapy will delay her hip repair.   Allergies  Allergen Reactions  . Maxzide [Triamterene-Hctz]   . Penicillins Hives    Has patient had a PCN reaction causing immediate rash, facial/tongue/throat swelling, SOB or lightheadedness with hypotension: Yes Has patient had a PCN reaction causing severe rash involving mucus membranes or skin necrosis: No Has patient had a PCN reaction that required hospitalization No Has patient had a PCN reaction occurring within the last 10 years: No If all of the above answers are "NO", then may proceed with Cephalosporin use.     Medications Scheduled Medications: . aspirin  81 mg Oral Daily  . atorvastatin  40 mg Oral q1800  . diltiazem  60 mg Oral Q6H  . docusate sodium  100 mg Oral Daily  . enalapril  20 mg Oral Daily  . insulin aspart  0-5 Units Subcutaneous QHS  . insulin aspart  0-9 Units Subcutaneous TID WC  . pantoprazole  40 mg Oral Daily  . sulfamethoxazole-trimethoprim  1 tablet Oral Q12H     Infusions: . sodium chloride 50 mL/hr at 11/09/15 1735  . heparin 1,100 Units/hr (11/09/15 2119)     PRN Medications:  acetaminophen, zolpidem   Past Medical History:  Diagnosis Date  . Glucose intolerance (impaired glucose tolerance)   . Hypertension   . Insomnia   . Raynaud phenomenon   . Reflux   . Skin cancer  Forehead    Past Surgical History:  Procedure Laterality Date  . BREAST REDUCTION SURGERY  2003  . COLONOSCOPY    . COLONOSCOPY N/A 06/13/2013   Procedure: COLONOSCOPY;  Surgeon: Rogene Houston, MD;  Location: [facility];  Service: Endoscopy;  Laterality: N/A;  930  . TONSILLECTOMY    . TUBAL LIGATION      Family History  Problem Relation Age of Onset  . Hypertension Mother   . Hypertension Father   . Diabetes Father   . Heart attack  Father   . Hypertension Brother     Social History Ms. Perz reports that she has never smoked. She has never used smokeless tobacco. Ms. Myklebust reports that she drinks about 1.2 oz of alcohol per week .  Review of Systems Complete review of systems are found to be negative unless outlined in H&P above.  Physical Examination Blood pressure 110/75, pulse 77, temperature 97.5 F (36.4 C), temperature source Oral, resp. rate 15, height 5\' 6"  (1.676 m), weight 187 lb 6.3 oz (85 kg), SpO2 95 %.  Intake/Output Summary (Last 24 hours) at 11/10/15 0930 Last data filed at 11/10/15 [id number]  Gross per 24 hour  Intake          2134.17 ml  Output              200 ml  Net          1934.17 ml    Telemetry: Atrial fibrillation, rates in the 70s, with occasional PVCs.  GEN: No acute distress HEENT: Conjunctiva and lids normal, oropharynx clear with moist mucosa. Neck: Supple, no elevated JVP or carotid bruits, no thyromegaly. Lungs: Clear to auscultation, nonlabored breathing at rest. Cardiac: Iregular rate and rhythm, no S3 or significant systolic murmur, no pericardial rub. Abdomen: Soft, nontender, no hepatomegaly, bowel sounds present, no guarding or rebound. Extremities: No pitting edema, distal pulses 2+. Skin: Warm and dry. Musculoskeletal: No kyphosis. Neuropsychiatric: Alert and oriented x3, affect grossly appropriate.  Prior Cardiac Testing/Procedures None Lab Results  Basic Metabolic Panel:  Recent Labs Lab 11/08/15 0840  NA 130*  K 3.3*  CL 93*  CO2 23  GLUCOSE 162*  BUN 10  CREATININE 0.68  CALCIUM 9.1    Liver Function Tests:  Recent Labs Lab 11/08/15 0840  AST 18  ALT 17  ALKPHOS 56  BILITOT 0.9  PROT 6.8  ALBUMIN 4.2    CBC:  Recent Labs Lab 11/08/15 0840 11/09/15 0512 11/10/15 0524  WBC 10.0 9.1 7.3  NEUTROABS 8.2*  --   --   HGB 13.3 11.6* 10.9*  HCT 36.7 32.8* 30.4*  MCV 91.8 92.9 92.4  PLT 275 227 223    Cardiac Enzymes:  Recent  Labs Lab 11/08/15 0840  TROPONINI <0.03     Radiology: Ct Soft Tissue Neck W Contrast  Result Date: 11/08/2015 CLINICAL DATA:  Increasing right-sided facial and neck swelling beginning 2 days ago. EXAM: CT NECK WITH CONTRAST TECHNIQUE: Multidetector CT imaging of the neck was performed using the standard protocol following the bolus administration of intravenous contrast. CONTRAST:  91mL ISOVUE-300 IOPAMIDOL (ISOVUE-300) INJECTION 61% COMPARISON:  None. FINDINGS: Pharynx and larynx: Diffuse right parapharyngeal space inflammatory change and unorganized fluid with inflammation extending inferiorly in the neck in the carotid and visceral spaces. There is distortion of the right side of the oropharynx. The airway remains patent. Edema involves the right-sided epiglottis and aryepiglottic fold. No pharyngeal or laryngeal mass identified. Retropharyngeal edema and small retropharyngeal effusion.  Salivary glands: Both submandibular glands are prominent in size, however the right is larger and appears edematous with surrounding submandibular space inflammation. There is marked diffuse asymmetric enlargement of the right parotid gland with patchy areas of hyperenhancement, most notably in the tail and deep lobe. Evaluation of the parotid glands (and evaluation for ductal stones) is limited by metallic dental streak artifact. There is prominent right periparotid inflammatory change which extends inferiorly throughout the right facial soft tissues and into the right neck. There is thickening of the platysma on the right. Thyroid: Multiple hypoattenuating thyroid nodules, with the largest measuring 11 mm in the isthmus. Lymph nodes: Multiple small right level II lymph nodes measure up to 7 mm in short axis and are likely reactive. Vascular: Major vascular structures of the neck appear patent. Atherosclerotic plaque is noted at the right greater than left carotid bifurcations without significant stenosis. Limited  intracranial: Unremarkable. Visualized orbits: Not imaged. Mastoids and visualized paranasal sinuses: Partially visualized moderate volume secretions in the right maxillary sinus with mild mucosal thickening. Visualized mastoid air cells are clear. Skeleton: Cervical disc degeneration, advanced at C6-7. Advanced left facet arthrosis at C3-4 with grade 1 anterolisthesis. Moderate atlantodental degenerative changes. Upper chest: No consolidation or mass in the visualized lung apices. IMPRESSION: 1. Marked enlargement and patchy hyperenhancement of the right parotid gland compatible with acute parotitis. Extensive inflammation throughout the right face and neck including right parapharyngeal space with edema in the epiglottis and aryepiglottic fold on the right. Patent airway. No abscess. 2. Right submandibular gland sialadenitis. Electronically Signed   By: Logan Bores M.D.   On: 11/08/2015 11:24     ECG: Atrial fibrillation with RVR rate of 126 bpm nonspecific changes.   Impression and Recommendations  1.New Onset Atrial fibrillation: Patient is unaware of irregular heart rhythm and is asymptomatic with this. Heart rate is now well controlled after diltiazem drip transition to by mouth diltiazem 60 mg every 6 hours. CHADS VASC Score 3. Creatinine 0.68. We'll discontinue IV heparin and began ELIQUIS 5 mg twice a day. Will discontinue aspirin.  Echocardiogram will be completed to evaluate LV function, and atrial size. May be related to acute infection of the parotid gland. However, uncertain how long she has been in atrial fib. Continue diltiazem. There are plans to transfer her to the floor.  We'll discuss duration of anticoagulation therapy depending on heart rate response to medication. TEE cardioversion can be planned once she is on anticoagulation for 3 weeks if she does not convert with pharmacologic therapy.  2. Hypertension: Blood pressure is soft on diltiazem. Review of home medications also  has her on enalapril 20 mg daily and HCTZ 25 mg daily. She is hypokalemic, and therefore, will replace this today. Check magnesium as she is on PPI. Likely related to HCTZ as well. She has not been restarted on HCTZ but remains on enalapril 20 mg daily. Will reduce to 10 mg daily to avoid hypotension.   3. Hypercholesterolemia: Continue statin therapy.   Signed: Phill Myron. Lawrence NP [place]  11/10/2015, 9:30 AM Co-Sign MD  The patient was seen and examined, and I agree with the history, physical exam, assessment and plan as documented above which has been discussed with Arnold Long NP, with modifications as noted below.  Pt admitted with new-onset atrial fibrillation in context of parotitis. She is asymptomatic, hence duration of a fib is unknown. Agree with transition to oral diltiazem and apixaban (with cessation of ASA). She may convert to NSR on  her own. I will order a 2-D echocardiogram with Doppler to evaluate cardiac structure, function, and regional wall motion.  Consideration can be given to DCCV after 3 weeks of anticoagulation if she does not convert on her own. Would eventually simplify diltiazem regimen to long-acting 180 or perhaps 240 mg daily.  Kate Sable, MD, [facility]  11/10/2015 10:33 AM

## 2015-11-10 NOTE — Discharge Instructions (Signed)

## 2015-11-10 NOTE — Progress Notes (Signed)
*  PRELIMINARY RESULTS* Echocardiogram 2D Echocardiogram has been performed.  Sara Terry 11/10/2015, 4:21 PM

## 2015-11-10 NOTE — Care Management Note (Signed)
Case Management Note  Patient Details  Name: Sara Terry MRN: [id number] Date of Birth: 17-Jul-1945  Subjective/Objective:                  Admitted with new onset a-fib. Pt is from home and is ind with ADL's. She has PCP, transportation to appointments and insurance with drug coverage. Pt will be Rx Eliquis at [facility]. 30-day free voucher given to pt by pharmacy. Benefit check offered to determine OOP cost fo pt but she is not interested at this time.   Action/Plan: No CM needs anticipated.   Expected Discharge Date:     11/11/2015             Expected Discharge Plan:  Home/Self Care  In-House Referral:  NA  Discharge planning Services  CM Consult  Post Acute Care Choice:  NA Choice offered to:  NA  DME Arranged:    DME Agency:     HH Arranged:    HH Agency:     Status of Service:  Completed, signed off  If discussed at [employer] of Stay Meetings, dates discussed:    Additional Comments:  Sherald Barge, RN 11/10/2015, 1:29 PM

## 2015-11-10 NOTE — Progress Notes (Signed)
[place] for Heparin Indication: atrial fibrillation  Allergies  Allergen Reactions  . Maxzide [Triamterene-Hctz]   . Penicillins Hives    Has patient had a PCN reaction causing immediate rash, facial/tongue/throat swelling, SOB or lightheadedness with hypotension: Yes Has patient had a PCN reaction causing severe rash involving mucus membranes or skin necrosis: No Has patient had a PCN reaction that required hospitalization No Has patient had a PCN reaction occurring within the last 10 years: No If all of the above answers are "NO", then may proceed with Cephalosporin use.    Patient Measurements: Height: 5\' 6"  (167.6 cm) Weight: 187 lb 6.3 oz (85 kg) IBW/kg (Calculated) : 59.3 HEPARIN DW (KG): 77.3   Vital Signs: Temp: 97.9 F (36.6 C) (09/05 0400) Temp Source: Oral (09/05 0400) BP: 110/75 (09/05 0400) Pulse Rate: 77 (09/05 0500)  Labs:  Recent Labs  11/08/15 0840 11/08/15 0843 11/08/15 2134 11/09/15 0512 11/10/15 0524  HGB 13.3  --   --  11.6* 10.9*  HCT 36.7  --   --  32.8* 30.4*  PLT 275  --   --  227 223  APTT  --  30  --   --   --   LABPROT  --  13.3  --   --   --   INR  --  1.01  --   --   --   HEPARINUNFRC  --   --  0.41 0.46 0.46  CREATININE 0.68  --   --   --   --   TROPONINI <0.03  --   --   --   --    Estimated Creatinine Clearance: 72.9 mL/min (by C-G formula based on SCr of 0.8 mg/dL).  Medical History: Past Medical History:  Diagnosis Date  . Glucose intolerance (impaired glucose tolerance)   . Hypertension   . Insomnia   . Raynaud phenomenon   . Reflux   . Skin cancer    Forehead   Medications:  Prescriptions Prior to Admission  Medication Sig Dispense Refill Last Dose  . aspirin 81 MG tablet Take 81 mg by mouth daily.   11/08/2015 at Unknown time  . calcium carbonate (OS-CAL - DOSED IN MG OF ELEMENTAL CALCIUM) 1250 (500 Ca) MG tablet Take 1 tablet by mouth daily with breakfast.   11/08/2015 at Unknown  time  . docusate sodium (COLACE) 100 MG capsule Take 100 mg by mouth daily.   11/08/2015 at Unknown time  . doxycycline (VIBRA-TABS) 100 MG tablet Take 1 tablet by mouth 2 (two) times daily. Starting 11/07/2015 x 10 days for swelling in face.   11/08/2015 at Unknown time  . enalapril (VASOTEC) 20 MG tablet Take 1 tablet (20 mg total) by mouth daily. 30 tablet 5 11/08/2015 at Unknown time  . hydrochlorothiazide (HYDRODIURIL) 25 MG tablet TAKE (1) TABLET BY MOUTH EACH MORNING. 30 tablet 5 11/08/2015 at Unknown time  . pantoprazole (PROTONIX) 40 MG tablet TAKE (1) TABLET BY MOUTH EACH MORNING. 30 tablet 5 11/08/2015 at Unknown time  . zolpidem (AMBIEN) 10 MG tablet Take 1 tablet (10 mg total) by mouth at bedtime as needed for sleep. 30 tablet 5 unknown   Assessment: 70  Yo female noted to be in afib with RVR (CHAD2VAS2 of 3), HL is therapeutic this morning.  No bleeding noted.  Hg dropped slightly. PLTC stable.  Goal of Therapy:  Heparin level 0.3-0.7 units/ml Monitor platelets by anticoagulation protocol: Yes   Plan:   Continue  heparin infusion at 1100 units/hr Check anti-Xa level daily while on heparin Continue to monitor H&H and platelets F/U long term anticoagulation plan.  Pricilla Larsson, [facility] 11/10/2015,8:01 AM

## 2015-11-10 NOTE — Progress Notes (Signed)
PROGRESS NOTE    Sara Terry             [id number] DOB: 06-24-1945 DOA: 11/08/2015 PCP: Mickie Hillier, MD                       Brief Narrative: patient was admitted into the ICU on Sept 3, for new onset of afib and parotitis, and was started on IV Cardiazem and IV Heparin.  She was given Bactrim for her parotitis.  Her HR has been controlled, but she remained in afib.  She has no other complaints.  She spiked fever to 101-102, and BCs were sent.  CXR showed no infiltrate, and UA was negative.  Since admission, her parotitis improved, and she has been transitioned to oral cardiazem.     Assessment & Plan:   Principal Problem:   New onset a-fib ([place]) Active Problems:   HLD (hyperlipidemia)   Essential hypertension, benign   Impaired fasting glucose   1. New onset of Afib, CHADSVASC 3, TSH is normal.  Decrease alcohol use encouraged.  Will continue with IV Heparin for now.  Will change IV Cardiazem to oral cardiazem.  No TTE ordered, awaiting cardiology consultation to see if TEE and cardioversion is desired.  We can transfer her to telemetry today.            2.   Right parotitis:  Continue with Bactrim.  Check BC.  No stone, mass, or lymphadenopathy seen in CT of the neck.           3.   HLD Continue with statin.            4.  DM.  GlycoHb pending.  Carb modified diet.   DVT prophylaxis: FULL IV Heparin. Code Status: FULL CODE.  Family Communication: Husband. Disposition Plan: To home when appropriate.   Consultants:   Cardiology pending.   Antimicrobials: Anti-infectives    Start     Dose/Rate Route Frequency Ordered Stop   11/08/15 1400  sulfamethoxazole-trimethoprim (BACTRIM DS,SEPTRA DS) 800-160 MG per tablet 1 tablet     1 tablet Oral Every 12 hours 11/08/15 1350         Subjective:  Feeling better today.   Objective: Vitals:   11/10/15 0200 11/10/15 0300 11/10/15 0400 11/10/15 0500  BP: 91/61  110/75   Pulse: 70 78 82 77  Resp: 15 16  17 15   Temp:   97.9 F (36.6 C)   TempSrc:   Oral   SpO2: 95% 95% 98% 95%  Weight:    85 kg (187 lb 6.3 oz)  Height:        Intake/Output Summary (Last 24 hours) at 11/10/15 0810 Last data filed at 11/10/15 [id number]  Gross per 24 hour  Intake          2134.17 ml  Output              200 ml  Net          1934.17 ml   Filed Weights   11/08/15 0801 11/09/15 0500 11/10/15 0500  Weight: 84.8 kg (187 lb) 84.1 kg (185 lb 6.5 oz) 85 kg (187 lb 6.3 oz)    Examination:  General exam: Appears calm and comfortable  Respiratory system: Clear to auscultation. Respiratory effort normal. Cardiovascular system: S1 & S2 heard, RRR. No JVD, murmurs, rubs, gallops or clicks. No pedal edema. Gastrointestinal system: Abdomen is nondistended, soft and nontender. No organomegaly or masses felt.  Normal bowel sounds heard. Central nervous system: Alert and oriented. No focal neurological deficits. Extremities: Symmetric 5 x 5 power. Skin: No rashes, lesions or ulcers Psychiatry: Judgement and insight appear normal. Mood & affect appropriate.   Data Reviewed: I have personally reviewed following labs and imaging studies  CBC:  Recent Labs Lab 11/08/15 0840 11/09/15 0512 11/10/15 0524  WBC 10.0 9.1 7.3  NEUTROABS 8.2*  --   --   HGB 13.3 11.6* 10.9*  HCT 36.7 32.8* 30.4*  MCV 91.8 92.9 92.4  PLT 275 227 [id number]   Basic Metabolic Panel:  Recent Labs Lab 11/08/15 0840  NA 130*  K 3.3*  CL 93*  CO2 23  GLUCOSE 162*  BUN 10  CREATININE 0.68  CALCIUM 9.1   GFR: Estimated Creatinine Clearance: 72.9 mL/min (by C-G formula based on SCr of 0.8 mg/dL). Liver Function Tests:  Recent Labs Lab 11/08/15 0840  AST 18  ALT 17  ALKPHOS 56  BILITOT 0.9  PROT 6.8  ALBUMIN 4.2   Coagulation Profile:  Recent Labs Lab 11/08/15 0843  INR 1.01   Cardiac Enzymes:  Recent Labs Lab 11/08/15 0840  TROPONINI <0.03   BNP (last 3 results) CBG:  Recent Labs Lab 11/09/15 0745 11/09/15 1134  11/09/15 1631 11/09/15 2216 11/10/15 0732  GLUCAP 140* 130* 126* 172* 119*   Lipid Profile: Thyroid Function Tests:  Recent Labs  11/08/15 0840  TSH 1.471    Recent Results (from the past 240 hour(s))  MRSA PCR Screening     Status: None   Collection Time: 11/08/15  2:40 PM  Result Value Ref Range Status   MRSA by PCR NEGATIVE NEGATIVE Final    Comment:        The GeneXpert MRSA Assay (FDA approved for NASAL specimens only), is one component of a comprehensive MRSA colonization surveillance program. It is not intended to diagnose MRSA infection nor to guide or monitor treatment for MRSA infections.   Culture, blood (Routine X 2) w Reflex to ID Panel     Status: None (Preliminary result)   Collection Time: 11/08/15  4:39 PM  Result Value Ref Range Status   Specimen Description BLOOD LEFT HAND  Final   Special Requests BOTTLES DRAWN AEROBIC AND ANAEROBIC 4CC EACH  Final   Culture PENDING  Incomplete   Report Status PENDING  Incomplete  Culture, blood (Routine X 2) w Reflex to ID Panel     Status: None (Preliminary result)   Collection Time: 11/08/15  4:44 PM  Result Value Ref Range Status   Specimen Description BLOOD RIGHT HAND  Final   Special Requests BOTTLES DRAWN AEROBIC AND ANAEROBIC 4CC EACH  Final   Culture PENDING  Incomplete   Report Status PENDING  Incomplete     Radiology Studies: Ct Soft Tissue Neck W Contrast  Result Date: 11/08/2015 CLINICAL DATA:  Increasing right-sided facial and neck swelling beginning 2 days ago. EXAM: CT NECK WITH CONTRAST TECHNIQUE: Multidetector CT imaging of the neck was performed using the standard protocol following the bolus administration of intravenous contrast. CONTRAST:  34mL ISOVUE-300 IOPAMIDOL (ISOVUE-300) INJECTION 61% COMPARISON:  None. FINDINGS: Pharynx and larynx: Diffuse right parapharyngeal space inflammatory change and unorganized fluid with inflammation extending inferiorly in the neck in the carotid and visceral  spaces. There is distortion of the right side of the oropharynx. The airway remains patent. Edema involves the right-sided epiglottis and aryepiglottic fold. No pharyngeal or laryngeal mass identified. Retropharyngeal edema and small retropharyngeal effusion. Salivary glands:  Both submandibular glands are prominent in size, however the right is larger and appears edematous with surrounding submandibular space inflammation. There is marked diffuse asymmetric enlargement of the right parotid gland with patchy areas of hyperenhancement, most notably in the tail and deep lobe. Evaluation of the parotid glands (and evaluation for ductal stones) is limited by metallic dental streak artifact. There is prominent right periparotid inflammatory change which extends inferiorly throughout the right facial soft tissues and into the right neck. There is thickening of the platysma on the right. Thyroid: Multiple hypoattenuating thyroid nodules, with the largest measuring 11 mm in the isthmus. Lymph nodes: Multiple small right level II lymph nodes measure up to 7 mm in short axis and are likely reactive. Vascular: Major vascular structures of the neck appear patent. Atherosclerotic plaque is noted at the right greater than left carotid bifurcations without significant stenosis. Limited intracranial: Unremarkable. Visualized orbits: Not imaged. Mastoids and visualized paranasal sinuses: Partially visualized moderate volume secretions in the right maxillary sinus with mild mucosal thickening. Visualized mastoid air cells are clear. Skeleton: Cervical disc degeneration, advanced at C6-7. Advanced left facet arthrosis at C3-4 with grade 1 anterolisthesis. Moderate atlantodental degenerative changes. Upper chest: No consolidation or mass in the visualized lung apices. IMPRESSION: 1. Marked enlargement and patchy hyperenhancement of the right parotid gland compatible with acute parotitis. Extensive inflammation throughout the right face  and neck including right parapharyngeal space with edema in the epiglottis and aryepiglottic fold on the right. Patent airway. No abscess. 2. Right submandibular gland sialadenitis. Electronically Signed   By: Logan Bores M.D.   On: 11/08/2015 11:24   Dg Chest Portable 1 View  Result Date: 11/08/2015 CLINICAL DATA:  New onset atrial for ablation. EXAM: PORTABLE CHEST 1 VIEW COMPARISON:  None. FINDINGS: The heart size is enlarged. Mediastinal contour is normal. There is no focal infiltrate, pulmonary edema, or pleural effusion. The visualized skeletal structures are unremarkable. IMPRESSION: No active cardial pulmonary disease.  Cardiomegaly. Electronically Signed   By: Abelardo Diesel M.D.   On: 11/08/2015 09:03    Scheduled Meds: . aspirin  81 mg Oral Daily  . atorvastatin  40 mg Oral q1800  . diltiazem  60 mg Oral Q6H  . docusate sodium  100 mg Oral Daily  . enalapril  20 mg Oral Daily  . insulin aspart  0-5 Units Subcutaneous QHS  . insulin aspart  0-9 Units Subcutaneous TID WC  . pantoprazole  40 mg Oral Daily  . sulfamethoxazole-trimethoprim  1 tablet Oral Q12H   Continuous Infusions: . sodium chloride 50 mL/hr at 11/09/15 1735  . heparin 1,100 Units/hr (11/09/15 2119)     LOS: 2 days   Orvan Falconer, MD FACP Hospitalist.   If 7PM-7AM, please contact night-coverage [email or web address] Password [email or web address] 11/10/2015, 8:10 AM

## 2015-11-11 LAB — BASIC METABOLIC PANEL
Anion gap: 6 (ref 5–15)
BUN: 10 mg/dL (ref 6–20)
CALCIUM: 8.9 mg/dL (ref 8.9–10.3)
CO2: 22 mmol/L (ref 22–32)
CREATININE: 0.59 mg/dL (ref 0.44–1.00)
Chloride: 107 mmol/L (ref 101–111)
GFR calc Af Amer: >60 mL/min (ref >60)
GLUCOSE: 107 mg/dL — AB (ref 65–99)
Potassium: 4.6 mmol/L (ref 3.5–5.1)
SODIUM: 135 mmol/L (ref 135–145)

## 2015-11-11 LAB — GLUCOSE, CAPILLARY
GLUCOSE-CAPILLARY: 133 mg/dL — AB (ref 65–99)
Glucose-Capillary: 100 mg/dL — ABNORMAL HIGH (ref 65–99)

## 2015-11-11 MED ORDER — DILTIAZEM HCL ER COATED BEADS 240 MG PO CP24
240.0000 mg | ORAL_CAPSULE | Freq: Every day | ORAL | Status: DC
  Administered 2015-11-11: 240 mg via ORAL
  Filled 2015-11-11: qty 1

## 2015-11-11 MED ORDER — APIXABAN 5 MG PO TABS: 5.0000 mg | ORAL_TABLET | Freq: Two times a day (BID) | ORAL | 1 refills | Status: DC

## 2015-11-11 MED ORDER — SULFAMETHOXAZOLE-TRIMETHOPRIM 800-160 MG PO TABS: 1.0000 | ORAL_TABLET | Freq: Two times a day (BID) | ORAL | 0 refills | Status: DC

## 2015-11-11 MED ORDER — ATORVASTATIN CALCIUM 40 MG PO TABS: 40.0000 mg | ORAL_TABLET | Freq: Every day | ORAL | 0 refills | Status: DC

## 2015-11-11 MED ORDER — DILTIAZEM HCL ER COATED BEADS 240 MG PO CP24: 240.0000 mg | ORAL_CAPSULE | Freq: Every day | ORAL | 1 refills | Status: DC

## 2015-11-11 NOTE — Care Management Important Message (Signed)
Important Message  Patient Details  Name: Sara Terry MRN: [id number] Date of Birth: August 09, 1945   Medicare Important Message Given:  Yes    Mattia Osterman, Chauncey [place], RN 11/11/2015, 12:57 PM

## 2015-11-11 NOTE — Discharge Summary (Signed)
Physician Discharge Summary  Sara Terry [id number] DOB: Nov 08, 1945 DOA: 11/08/2015  PCP: Mickie Hillier, MD  Admit date: 11/08/2015 Discharge date: 11/11/2015  Admitted From: Home  Disposition:  Home   Recommendations for Outpatient Follow-up:  1. Follow up with PCP in 1-2 weeks 2. Please obtain BMP/CBC in one week 3. Follow up with cardiology in 1-2 weeks  Home Health: No  Equipment/Devices: None  Discharge Condition: Stable  CODE STATUS: Full  Diet recommendation: Carb modified   Brief/Interim Summary: 63 yof with a history of HTN, HLD, and glucose intolerance presented with complaints of jaw pain. CXR was negative. CT of neck revealed a marked enlargement and patchy hyperenhancement of the right parotid gland compatible with acute parotitis. EKG showed afib. She was admitted for further evaluation of a new onset of atrial fibrillation.  Discharge Diagnoses:  Principal Problem:   New onset a-fib ([place]) Active Problems:   HLD (hyperlipidemia)   Essential hypertension, benign   Impaired fasting glucose  1. New onset of Afib. Cardiology was consulted and recommended rate control with oral Cardizem. Heart rate has remained stable. Further titration of medications to be done as an outpatient. 2. Right-sided parotitis. Her neck CT was negative for any masses. Patient was started on oral bactrim with improvement of her symptoms. Swelling has significantly improved and she does not have any erythema. Will continue bactrim as an outpatient. Blood cultures were negative. 3. HLD. Continue statin  4. HTN. Remained stable. Continue outpatient regimen.  5. DM. She has a glucose intolerance. Remained stable.   Discharge Instructions Discharge Instructions    Diet - low sodium heart healthy    Complete by:  As directed   Increase activity slowly    Complete by:  As directed       Medication List    STOP taking these medications   aspirin 81 MG tablet   doxycycline 100 MG  tablet Commonly known as:  VIBRA-TABS     TAKE these medications   apixaban 5 MG Tabs tablet Commonly known as:  ELIQUIS Take 1 tablet (5 mg total) by mouth 2 (two) times daily.   atorvastatin 40 MG tablet Commonly known as:  LIPITOR Take 1 tablet (40 mg total) by mouth daily at 6 PM.   calcium carbonate 1250 (500 Ca) MG tablet Commonly known as:  OS-CAL - dosed in mg of elemental calcium Take 1 tablet by mouth daily with breakfast.   diltiazem 240 MG 24 hr capsule Commonly known as:  CARDIZEM CD Take 1 capsule (240 mg total) by mouth daily.   docusate sodium 100 MG capsule Commonly known as:  COLACE Take 100 mg by mouth daily.   enalapril 20 MG tablet Commonly known as:  VASOTEC Take 1 tablet (20 mg total) by mouth daily.   hydrochlorothiazide 25 MG tablet Commonly known as:  HYDRODIURIL TAKE (1) TABLET BY MOUTH EACH MORNING.   pantoprazole 40 MG tablet Commonly known as:  PROTONIX TAKE (1) TABLET BY MOUTH EACH MORNING.   sulfamethoxazole-trimethoprim 800-160 MG tablet Commonly known as:  BACTRIM DS,SEPTRA DS Take 1 tablet by mouth every 12 (twelve) hours.   zolpidem 10 MG tablet Commonly known as:  AMBIEN Take 1 tablet (10 mg total) by mouth at bedtime as needed for sleep.      Follow-up Information    Mickie Hillier, MD In 1 week.   Specialty:  Family Medicine Why:  Call office for appointment Contact information: [place] [address] [phone]  Allergies  Allergen Reactions  . Maxzide [Triamterene-Hctz]   . Penicillins Hives    Has patient had a PCN reaction causing immediate rash, facial/tongue/throat swelling, SOB or lightheadedness with hypotension: Yes Has patient had a PCN reaction causing severe rash involving mucus membranes or skin necrosis: No Has patient had a PCN reaction that required hospitalization No Has patient had a PCN reaction occurring within the last 10 years: No If all of the above answers  are "NO", then may proceed with Cephalosporin use.     Consultations:  Cardiology    Procedures/Studies: Ct Soft Tissue Neck W Contrast  Result Date: 11/08/2015 CLINICAL DATA:  Increasing right-sided facial and neck swelling beginning 2 days ago. EXAM: CT NECK WITH CONTRAST TECHNIQUE: Multidetector CT imaging of the neck was performed using the standard protocol following the bolus administration of intravenous contrast. CONTRAST:  48mL ISOVUE-300 IOPAMIDOL (ISOVUE-300) INJECTION 61% COMPARISON:  None. FINDINGS: Pharynx and larynx: Diffuse right parapharyngeal space inflammatory change and unorganized fluid with inflammation extending inferiorly in the neck in the carotid and visceral spaces. There is distortion of the right side of the oropharynx. The airway remains patent. Edema involves the right-sided epiglottis and aryepiglottic fold. No pharyngeal or laryngeal mass identified. Retropharyngeal edema and small retropharyngeal effusion. Salivary glands: Both submandibular glands are prominent in size, however the right is larger and appears edematous with surrounding submandibular space inflammation. There is marked diffuse asymmetric enlargement of the right parotid gland with patchy areas of hyperenhancement, most notably in the tail and deep lobe. Evaluation of the parotid glands (and evaluation for ductal stones) is limited by metallic dental streak artifact. There is prominent right periparotid inflammatory change which extends inferiorly throughout the right facial soft tissues and into the right neck. There is thickening of the platysma on the right. Thyroid: Multiple hypoattenuating thyroid nodules, with the largest measuring 11 mm in the isthmus. Lymph nodes: Multiple small right level II lymph nodes measure up to 7 mm in short axis and are likely reactive. Vascular: Major vascular structures of the neck appear patent. Atherosclerotic plaque is noted at the right greater than left carotid  bifurcations without significant stenosis. Limited intracranial: Unremarkable. Visualized orbits: Not imaged. Mastoids and visualized paranasal sinuses: Partially visualized moderate volume secretions in the right maxillary sinus with mild mucosal thickening. Visualized mastoid air cells are clear. Skeleton: Cervical disc degeneration, advanced at C6-7. Advanced left facet arthrosis at C3-4 with grade 1 anterolisthesis. Moderate atlantodental degenerative changes. Upper chest: No consolidation or mass in the visualized lung apices. IMPRESSION: 1. Marked enlargement and patchy hyperenhancement of the right parotid gland compatible with acute parotitis. Extensive inflammation throughout the right face and neck including right parapharyngeal space with edema in the epiglottis and aryepiglottic fold on the right. Patent airway. No abscess. 2. Right submandibular gland sialadenitis. Electronically Signed   By: Logan Bores M.D.   On: 11/08/2015 11:24   Dg Chest Portable 1 View  Result Date: 11/08/2015 CLINICAL DATA:  New onset atrial for ablation. EXAM: PORTABLE CHEST 1 VIEW COMPARISON:  None. FINDINGS: The heart size is enlarged. Mediastinal contour is normal. There is no focal infiltrate, pulmonary edema, or pleural effusion. The visualized skeletal structures are unremarkable. IMPRESSION: No active cardial pulmonary disease.  Cardiomegaly. Electronically Signed   By: Abelardo Diesel M.D.   On: 11/08/2015 09:03   Echo  - Left ventricle: The cavity size was normal. Wall thickness was   normal. Systolic function was normal. The estimated ejection   fraction was  in the range of 60% to 65%. Wall motion was normal;   there were no regional wall motion abnormalities. The study is   not technically sufficient to allow evaluation of LV diastolic   function. - Mitral valve: Mildly calcified annulus. There was moderate   regurgitation. - Left atrium: The atrium was moderately dilated. - Right ventricle: Systolic  function was mildly reduced. - Right atrium: The atrium was moderately dilated. - Tricuspid valve: There was moderate regurgitation.   Subjective: Feels great. Infection has improved. Walking well. Denies chest pain, heart flutters, trouble breathing, or discharge from her mouth.  Discharge Exam: Vitals:   11/11/15 0833 11/11/15 1041  BP: 118/68 130/79  Pulse: 82 73  Resp:    Temp:     Vitals:   11/10/15 2054 11/11/15 0500 11/11/15 0833 11/11/15 1041  BP: 122/77 122/85 118/68 130/79  Pulse: 71 65 82 73  Resp: 18 18    Temp: 97.8 F (36.6 C) 97.6 F (36.4 C)    TempSrc: Oral Oral    SpO2: (!) 78% 97%    Weight:      Height:        General: Pt is alert, awake, not in acute distress.  HENT: Mild swelling in right parotid gland with underlying warmth. No tenderness to palpation. Cardiovascular: S1/S2 +, no rubs, no gallops. Heart sounds are irregular. Respiratory: CTA bilaterally, no wheezing, no rhonchi Abdominal: Soft, NT, ND, bowel sounds + Extremities: no edema, no cyanosis    The results of significant diagnostics from this hospitalization (including imaging, microbiology, ancillary and laboratory) are listed below for reference.     Microbiology: Recent Results (from the past 240 hour(s))  MRSA PCR Screening     Status: None   Collection Time: 11/08/15  2:40 PM  Result Value Ref Range Status   MRSA by PCR NEGATIVE NEGATIVE Final    Comment:        The GeneXpert MRSA Assay (FDA approved for NASAL specimens only), is one component of a comprehensive MRSA colonization surveillance program. It is not intended to diagnose MRSA infection nor to guide or monitor treatment for MRSA infections.   Culture, blood (Routine X 2) w Reflex to ID Panel     Status: None (Preliminary result)   Collection Time: 11/08/15  4:39 PM  Result Value Ref Range Status   Specimen Description BLOOD LEFT HAND  Final   Special Requests BOTTLES DRAWN AEROBIC AND ANAEROBIC 4CC EACH   Final   Culture NO GROWTH 3 DAYS  Final   Report Status PENDING  Incomplete  Culture, blood (Routine X 2) w Reflex to ID Panel     Status: None (Preliminary result)   Collection Time: 11/08/15  4:44 PM  Result Value Ref Range Status   Specimen Description BLOOD RIGHT HAND  Final   Special Requests BOTTLES DRAWN AEROBIC AND ANAEROBIC 4CC EACH  Final   Culture NO GROWTH 3 DAYS  Final   Report Status PENDING  Incomplete     Labs: BNP (last 3 results) No results for input(s): BNP in the last 8760 hours. Basic Metabolic Panel:  Recent Labs Lab 11/08/15 0840 11/10/15 0524 11/11/15 0557  NA 130*  --  135  K 3.3*  --  4.6  CL 93*  --  107  CO2 23  --  22  GLUCOSE 162*  --  107*  BUN 10  --  10  CREATININE 0.68  --  0.59  CALCIUM 9.1  --  8.9  MG  --  1.7  --    Liver Function Tests:  Recent Labs Lab 11/08/15 0840  AST 18  ALT 17  ALKPHOS 56  BILITOT 0.9  PROT 6.8  ALBUMIN 4.2   No results for input(s): LIPASE, AMYLASE in the last 168 hours. No results for input(s): AMMONIA in the last 168 hours. CBC:  Recent Labs Lab 11/08/15 0840 11/09/15 0512 11/10/15 0524  WBC 10.0 9.1 7.3  NEUTROABS 8.2*  --   --   HGB 13.3 11.6* 10.9*  HCT 36.7 32.8* 30.4*  MCV 91.8 92.9 92.4  PLT 275 227 223   Cardiac Enzymes:  Recent Labs Lab 11/08/15 0840  TROPONINI <0.03   BNP: Invalid input(s): POCBNP CBG:  Recent Labs Lab 11/10/15 1144 11/10/15 1646 11/10/15 2049 11/11/15 0817 11/11/15 1143  GLUCAP 124* 110* 118* 133* 100*   D-Dimer No results for input(s): DDIMER in the last 72 hours. Hgb A1c No results for input(s): HGBA1C in the last 72 hours. Lipid Profile No results for input(s): CHOL, HDL, LDLCALC, TRIG, CHOLHDL, LDLDIRECT in the last 72 hours. Thyroid function studies No results for input(s): TSH, T4TOTAL, T3FREE, THYROIDAB in the last 72 hours.  Invalid input(s): FREET3 Anemia work up No results for input(s): VITAMINB12, FOLATE, FERRITIN, TIBC,  IRON, RETICCTPCT in the last 72 hours. Urinalysis    Component Value Date/Time   COLORURINE YELLOW 11/08/2015 1700   APPEARANCEUR CLEAR 11/08/2015 1700   LABSPEC <1.005 (L) 11/08/2015 1700   PHURINE 7.0 11/08/2015 1700   GLUCOSEU NEGATIVE 11/08/2015 1700   HGBUR NEGATIVE 11/08/2015 1700   BILIRUBINUR NEGATIVE 11/08/2015 1700   KETONESUR TRACE (A) 11/08/2015 1700   PROTEINUR NEGATIVE 11/08/2015 1700   NITRITE NEGATIVE 11/08/2015 1700   LEUKOCYTESUR NEGATIVE 11/08/2015 1700   Sepsis Labs Invalid input(s): PROCALCITONIN,  WBC,  LACTICIDVEN Microbiology Recent Results (from the past 240 hour(s))  MRSA PCR Screening     Status: None   Collection Time: 11/08/15  2:40 PM  Result Value Ref Range Status   MRSA by PCR NEGATIVE NEGATIVE Final    Comment:        The GeneXpert MRSA Assay (FDA approved for NASAL specimens only), is one component of a comprehensive MRSA colonization surveillance program. It is not intended to diagnose MRSA infection nor to guide or monitor treatment for MRSA infections.   Culture, blood (Routine X 2) w Reflex to ID Panel     Status: None (Preliminary result)   Collection Time: 11/08/15  4:39 PM  Result Value Ref Range Status   Specimen Description BLOOD LEFT HAND  Final   Special Requests BOTTLES DRAWN AEROBIC AND ANAEROBIC 4CC EACH  Final   Culture NO GROWTH 3 DAYS  Final   Report Status PENDING  Incomplete  Culture, blood (Routine X 2) w Reflex to ID Panel     Status: None (Preliminary result)   Collection Time: 11/08/15  4:44 PM  Result Value Ref Range Status   Specimen Description BLOOD RIGHT HAND  Final   Special Requests BOTTLES DRAWN AEROBIC AND ANAEROBIC 4CC EACH  Final   Culture NO GROWTH 3 DAYS  Final   Report Status PENDING  Incomplete     Time coordinating discharge: Over 30 minutes  SIGNED:   Kathie Dike, MD  [facility] 11/11/2015, 2:00 PM If 7PM-7AM, please contact night-coverage [email or web address] Password [email or web address]  By  signing my name below, I, Collene Leyden, attest that this documentation has been prepared under the direction and in the presence of  Kathie Dike, MD. Electronically signed: Collene Leyden, Scribe. 11/11/15 1:32 PM  I, Dr. Kathie Dike, personally performed the services described in this documentaiton. All medical record entries made by the scribe were at my direction and in my presence. I have reviewed the chart and agree that the record reflects my personal performance and is accurate and complete  Kathie Dike, MD, 11/11/2015 2:00 PM

## 2015-11-11 NOTE — Progress Notes (Addendum)
Subjective:  Patient hoping to go home. Asymptomatic with afib.    Objective:  Vital Signs in the last 24 hours: Temp:  [97 F (36.1 C)-97.8 F (36.6 C)] 97.6 F (36.4 C) (09/06 0500) Pulse Rate:  [65-71] 65 (09/06 0500) Resp:  [18] 18 (09/06 0500) BP: (122)/(77-85) 122/85 (09/06 0500) SpO2:  [78 %-99 %] 97 % (09/06 0500)  Intake/Output from previous day: 09/05 0701 - 09/06 0700 In: 720 [P.O.:720] Out: 1 [Urine:1] Intake/Output from this shift: No intake/output data recorded.  Physical Exam: NECK: Without JVD, HJR, or bruit LUNGS: Clear anterior, posterior, lateral HEART:Irregular rate and rhythm, no murmur, gallop, rub, bruit, thrill, or heave EXTREMITIES: Without cyanosis, clubbing, or edema   Lab Results:  Recent Labs  11/09/15 0512 11/10/15 0524  WBC 9.1 7.3  HGB 11.6* 10.9*  PLT 227 223    Recent Labs  11/08/15 0840 11/11/15 0557  NA 130* 135  K 3.3* 4.6  CL 93* 107  CO2 23 22  GLUCOSE 162* 107*  BUN 10 10  CREATININE 0.68 0.59    Recent Labs  11/08/15 0840  TROPONINI <0.03   Hepatic Function Panel  Recent Labs  11/08/15 0840  PROT 6.8  ALBUMIN 4.2  AST 18  ALT 17  ALKPHOS 56  BILITOT 0.9   No results for input(s): CHOL in the last 72 hours. No results for input(s): PROTIME in the last 72 hours.    Cardiac Studies: 2-D echo 11/10/15 Study Conclusions   - Left ventricle: The cavity size was normal. Wall thickness was   normal. Systolic function was normal. The estimated ejection   fraction was in the range of 60% to 65%. Wall motion was normal;   there were no regional wall motion abnormalities. The study is   not technically sufficient to allow evaluation of LV diastolic   function. - Mitral valve: Mildly calcified annulus. There was moderate   regurgitation. - Left atrium: The atrium was moderately dilated. - Right ventricle: Systolic function was mildly reduced. - Right atrium: The atrium was moderately dilated. - Tricuspid  valve: There was moderate regurgitation.  Assessment/Plan:  New onset atrial fibrillation in setting of parotitis CHADSVASC=3. Eliquis started. 2-D echo with normal LVEF 60-65% left atrium moderately dilated moderate TR. HR in 90's now but 120's earlier this am. On Diltiazem 60 mg q 6 and last dose was 5:30 am.Will change to 240 mg now and have her ambulate. May need another agent.  HTN stable.   LOS: 3 days    Ermalinda Barrios 11/11/2015, 7:48 AM   Pt remains asymptomatic. About to walk hallway. Has not received long-acting diltiazem yet. Agree with discharging on 240 mg daily for now. Further med adjustments can be made in outpatient setting. Continue Eliquis.

## 2015-11-12 ENCOUNTER — Encounter: Payer: Self-pay | Admitting: Adult Health

## 2015-11-13 LAB — CULTURE, BLOOD (ROUTINE X 2)
CULTURE: NO GROWTH
Culture: NO GROWTH

## 2015-11-19 ENCOUNTER — Ambulatory Visit (INDEPENDENT_AMBULATORY_CARE_PROVIDER_SITE_OTHER): Payer: Medicare Other | Admitting: Adult Health

## 2015-11-19 ENCOUNTER — Encounter: Payer: Self-pay | Admitting: Adult Health

## 2015-11-19 VITALS — BP 118/72 | HR 99 | Ht 66.0 in | Wt 181.0 lb

## 2015-11-19 DIAGNOSIS — I4819 Other persistent atrial fibrillation: Secondary | ICD-10-CM

## 2015-11-19 DIAGNOSIS — I481 Persistent atrial fibrillation: Secondary | ICD-10-CM

## 2015-11-19 DIAGNOSIS — I10 Essential (primary) hypertension: Secondary | ICD-10-CM

## 2015-11-19 NOTE — Progress Notes (Signed)
Cardiology Office Note   Date:  11/19/2015   ID:  Sara Terry Sep 27, 1945, MRN [id number]  PCP:  Sara Hillier, MD  Cardiologist: Sara Chen, NP   Chief Complaint  Patient presents with  . Atrial Fibrillation      History of Present Illness: Sara Terry is a 70 y.o. female who presents for posthospitalization follow-up after admission for complaints of jaw pain and atrial fib with RVR. History includes hypertension, hyperlipidemia, glucose intolerance. She is being treated for acute right-sided parotitis by PCP. The patient was treated with oral diltiazem after transition from IV. Her heart rate remained stable. CHADS VASC Score of 3 was started on ELIQUIS 5 mg twice a day. Aspirin was discontinued.    She comes today feeling very well. No complaints of bleeding rapid heart rhythm fatigue or dyspnea.  Left ventricle: The cavity size was normal. Wall thickness was   normal. Systolic function was normal. The estimated ejection   fraction was in the range of 60% to 65%. Wall motion was normal;   there were no regional wall motion abnormalities. The study is   not technically sufficient to allow evaluation of LV diastolic   function. - Mitral valve: Mildly calcified annulus. There was moderate   regurgitation. - Left atrium: The atrium was moderately dilated. - Right ventricle: Systolic function was mildly reduced. - Right atrium: The atrium was moderately dilated. - Tricuspid valve: There was moderate regurgitation.  Past Medical History:  Diagnosis Date  . Glucose intolerance (impaired glucose tolerance)   . Hypertension   . Insomnia   . Raynaud phenomenon   . Reflux   . Skin cancer    Forehead    Past Surgical History:  Procedure Laterality Date  . BREAST REDUCTION SURGERY  2003  . COLONOSCOPY    . COLONOSCOPY N/A 06/13/2013   Procedure: COLONOSCOPY;  Surgeon: Rogene Houston, MD;  Location: [facility];  Service: Endoscopy;  Laterality: N/A;   930  . TONSILLECTOMY    . TUBAL LIGATION       Current Outpatient Prescriptions  Medication Sig Dispense Refill  . apixaban (ELIQUIS) 5 MG TABS tablet Take 1 tablet (5 mg total) by mouth 2 (two) times daily. 60 tablet 1  . atorvastatin (LIPITOR) 40 MG tablet Take 1 tablet (40 mg total) by mouth daily at 6 PM. 30 tablet 0  . calcium carbonate (OS-CAL - DOSED IN MG OF ELEMENTAL CALCIUM) 1250 (500 Ca) MG tablet Take 1 tablet by mouth daily with breakfast.    . diltiazem (CARDIZEM CD) 240 MG 24 hr capsule Take 1 capsule (240 mg total) by mouth daily. 30 capsule 1  . docusate sodium (COLACE) 100 MG capsule Take 100 mg by mouth daily.    . enalapril (VASOTEC) 20 MG tablet Take 1 tablet (20 mg total) by mouth daily. 30 tablet 5  . hydrochlorothiazide (HYDRODIURIL) 25 MG tablet TAKE (1) TABLET BY MOUTH EACH MORNING. 30 tablet 5  . pantoprazole (PROTONIX) 40 MG tablet TAKE (1) TABLET BY MOUTH EACH MORNING. 30 tablet 5  . zolpidem (AMBIEN) 10 MG tablet Take 1 tablet (10 mg total) by mouth at bedtime as needed for sleep. 30 tablet 5   No current facility-administered medications for this visit.     Allergies:   Maxzide [triamterene-hctz] and Penicillins    Social History:  The patient  reports that she has never smoked. She has never used smokeless tobacco. She reports that she drinks about 1.2  oz of alcohol per week . She reports that she does not use drugs.   Family History:  The patient's family history includes Diabetes in her father; Heart attack in her father; Hypertension in her brother, father, and mother.    ROS: All other systems are reviewed and negative. Unless otherwise mentioned in H&P    PHYSICAL EXAM: VS:  BP 118/72   Pulse 99   Ht 5\' 6"  (1.676 m)   Wt 181 lb (82.1 kg)   SpO2 99%   BMI 29.21 kg/m  , BMI Body mass index is 29.21 kg/m. GEN: Well nourished, well developed, in no acute distress  HEENT: normal  Neck: no JVD, carotid bruits, or masses Cardiac: IRRR; no  murmurs, rubs, or gallops,no edema  Respiratory:  clear to auscultation bilaterally, normal work of breathing GI: soft, nontender, nondistended, + BS MS: no deformity or atrophy  Skin: warm and dry, no rash Neuro:  Strength and sensation are intact Psych: euthymic mood, full affect  Recent Labs: 11/08/2015: ALT 17; TSH 1.471 11/10/2015: Hemoglobin 10.9; Magnesium 1.7; Platelets 223 11/11/2015: BUN 10; Creatinine, Ser 0.59; Potassium 4.6; Sodium 135    Lipid Panel    Component Value Date/Time   CHOL 195 07/09/2015 0813   TRIG 86 07/09/2015 0813   HDL 61 07/09/2015 0813   CHOLHDL 3.2 07/09/2015 0813   CHOLHDL 3.1 10/26/2012 0955   VLDL 10 10/26/2012 0955   LDLCALC 117 (H) 07/09/2015 0813      Wt Readings from Last 3 Encounters:  11/19/15 181 lb (82.1 kg)  11/10/15 187 lb 6.3 oz (85 kg)  09/24/15 190 lb (86.2 kg)     ASSESSMENT AND PLAN:  1.  Atrial fibrillation: Heart rate is well controlled on current medication regimen. We'll continue diltiazem 240 mg daily along with ELIQUIS 5 mg twice a day. She does have concerns about cost and insurance coverage of this medication. We will follow-up concerning this to evaluate need to change anticoagulation therapy to insurance formulary should this become an issue for her.  2. Hypertension: Blood pressure is well-controlled. She will continue on HCTZ with enalapril.    Current medicines are reviewed at length with the patient today.    Labs/ tests ordered today include:  No orders of the defined types were placed in this encounter.    Disposition:   FU with 6 months.  Signed, Jory Sims, NP  11/19/2015 4:56 PM    [facility] [address] [place], [place] [address] Phone: [phone]; Fax: [phone]

## 2015-11-19 NOTE — Progress Notes (Signed)
Name: Sara Terry    DOB: April 24, 1945  Age: 70 y.o.  MR#: [id number]       PCP:  Mickie Hillier, MD      Insurance: Payor: [employer] MEDICARE / Plan: [facility] MEDICARE / Product Type: *No Product type* /   CC:   No chief complaint on file.   VS Vitals:   11/19/15 1513  Pulse: 99  SpO2: 99%  Weight: 181 lb (82.1 kg)  Height: 5\' 6"  (1.676 m)    Weights Current Weight  11/19/15 181 lb (82.1 kg)  11/10/15 187 lb 6.3 oz (85 kg)  09/24/15 190 lb (86.2 kg)    Blood Pressure  BP Readings from Last 3 Encounters:  11/11/15 130/79  11/05/15 (!) 146/94  09/24/15 (!) 161/117     Admit date:  (Not on file) Last encounter with RMR:  Visit date not found   Allergy Maxzide [triamterene-hctz] and Penicillins  Current Outpatient Prescriptions  Medication Sig Dispense Refill  . apixaban (ELIQUIS) 5 MG TABS tablet Take 1 tablet (5 mg total) by mouth 2 (two) times daily. 60 tablet 1  . atorvastatin (LIPITOR) 40 MG tablet Take 1 tablet (40 mg total) by mouth daily at 6 PM. 30 tablet 0  . calcium carbonate (OS-CAL - DOSED IN MG OF ELEMENTAL CALCIUM) 1250 (500 Ca) MG tablet Take 1 tablet by mouth daily with breakfast.    . diltiazem (CARDIZEM CD) 240 MG 24 hr capsule Take 1 capsule (240 mg total) by mouth daily. 30 capsule 1  . docusate sodium (COLACE) 100 MG capsule Take 100 mg by mouth daily.    . enalapril (VASOTEC) 20 MG tablet Take 1 tablet (20 mg total) by mouth daily. 30 tablet 5  . hydrochlorothiazide (HYDRODIURIL) 25 MG tablet TAKE (1) TABLET BY MOUTH EACH MORNING. 30 tablet 5  . pantoprazole (PROTONIX) 40 MG tablet TAKE (1) TABLET BY MOUTH EACH MORNING. 30 tablet 5  . zolpidem (AMBIEN) 10 MG tablet Take 1 tablet (10 mg total) by mouth at bedtime as needed for sleep. 30 tablet 5   No current facility-administered medications for this visit.     Discontinued Meds:    Medications Discontinued During This Encounter  Medication Reason  . sulfamethoxazole-trimethoprim (BACTRIM  DS,SEPTRA DS) 800-160 MG tablet Error    Patient Active Problem List   Diagnosis Date Noted  . New onset a-fib ([place]) 11/08/2015  . HLD (hyperlipidemia) 10/26/2012  . Essential hypertension, benign 10/26/2012  . Impaired fasting glucose 10/26/2012  . Insomnia 10/26/2012    LABS    Component Value Date/Time   NA 135 11/11/2015 0557   NA 130 (L) 11/08/2015 0840   NA 138 07/09/2015 0813   NA 136 07/04/2014 0849   NA 138 10/26/2012 0955   K 4.6 11/11/2015 0557   K 3.3 (L) 11/08/2015 0840   K 4.2 07/09/2015 0813   CL 107 11/11/2015 0557   CL 93 (L) 11/08/2015 0840   CL 96 07/09/2015 0813   CO2 22 11/11/2015 0557   CO2 23 11/08/2015 0840   CO2 23 07/09/2015 0813   GLUCOSE 107 (H) 11/11/2015 0557   GLUCOSE 162 (H) 11/08/2015 0840   GLUCOSE 119 (H) 07/09/2015 0813   GLUCOSE 121 (H) 07/04/2014 0849   GLUCOSE 105 (H) 10/26/2012 0955   BUN 10 11/11/2015 0557   BUN 10 11/08/2015 0840   BUN 15 07/09/2015 0813   BUN 16 07/04/2014 0849   BUN 14 10/26/2012 0955   CREATININE 0.59 11/11/2015 0557  CREATININE 0.68 11/08/2015 0840   CREATININE 0.64 07/09/2015 0813   CREATININE 0.60 10/26/2012 0955   CALCIUM 8.9 11/11/2015 0557   CALCIUM 9.1 11/08/2015 0840   CALCIUM 9.1 07/09/2015 0813   GFRNONAA >60 11/11/2015 0557   GFRNONAA >60 11/08/2015 0840   GFRNONAA 92 07/09/2015 0813   GFRAA >60 11/11/2015 0557   GFRAA >60 11/08/2015 0840   GFRAA 106 07/09/2015 0813   CMP     Component Value Date/Time   NA 135 11/11/2015 0557   NA 138 07/09/2015 0813   K 4.6 11/11/2015 0557   CL 107 11/11/2015 0557   CO2 22 11/11/2015 0557   GLUCOSE 107 (H) 11/11/2015 0557   BUN 10 11/11/2015 0557   BUN 15 07/09/2015 0813   CREATININE 0.59 11/11/2015 0557   CREATININE 0.60 10/26/2012 0955   CALCIUM 8.9 11/11/2015 0557   PROT 6.8 11/08/2015 0840   PROT 6.4 07/09/2015 0813   ALBUMIN 4.2 11/08/2015 0840   ALBUMIN 4.3 07/09/2015 0813   AST 18 11/08/2015 0840   ALT 17 11/08/2015 0840   ALKPHOS  56 11/08/2015 0840   BILITOT 0.9 11/08/2015 0840   BILITOT 0.3 07/09/2015 0813   GFRNONAA >60 11/11/2015 0557   GFRAA >60 11/11/2015 0557       Component Value Date/Time   WBC 7.3 11/10/2015 0524   WBC 9.1 11/09/2015 0512   WBC 10.0 11/08/2015 0840   HGB 10.9 (L) 11/10/2015 0524   HGB 11.6 (L) 11/09/2015 0512   HGB 13.3 11/08/2015 0840   HCT 30.4 (L) 11/10/2015 0524   HCT 32.8 (L) 11/09/2015 0512   HCT 36.7 11/08/2015 0840   MCV 92.4 11/10/2015 0524   MCV 92.9 11/09/2015 0512   MCV 91.8 11/08/2015 0840    Lipid Panel     Component Value Date/Time   CHOL 195 07/09/2015 0813   TRIG 86 07/09/2015 0813   HDL 61 07/09/2015 0813   CHOLHDL 3.2 07/09/2015 0813   CHOLHDL 3.1 10/26/2012 0955   VLDL 10 10/26/2012 0955   LDLCALC 117 (H) 07/09/2015 0813    ABG No results found for: PHART, PCO2ART, PO2ART, HCO3, TCO2, ACIDBASEDEF, O2SAT   Lab Results  Component Value Date   TSH 1.471 11/08/2015   BNP (last 3 results) No results for input(s): BNP in the last 8760 hours.  ProBNP (last 3 results) No results for input(s): PROBNP in the last 8760 hours.  Cardiac Panel (last 3 results) No results for input(s): CKTOTAL, CKMB, TROPONINI, RELINDX in the last 72 hours.  Iron/TIBC/Ferritin/ %Sat No results found for: IRON, TIBC, FERRITIN, IRONPCTSAT   EKG Orders placed or performed during the hospital encounter of 11/08/15  . EKG 12-Lead  . EKG 12-Lead  . EKG     Prior Assessment and Plan Problem List as of 11/19/2015 Reviewed: 11/08/2015  1:00 PM by Orvan Falconer, MD     Cardiovascular and Mediastinum   Essential hypertension, benign   New onset a-fib ([place])     Endocrine   Impaired fasting glucose     Other   HLD (hyperlipidemia)   Insomnia       Imaging: Ct Soft Tissue Neck W Contrast  Result Date: 11/08/2015 CLINICAL DATA:  Increasing right-sided facial and neck swelling beginning 2 days ago. EXAM: CT NECK WITH CONTRAST TECHNIQUE: Multidetector CT imaging of the neck  was performed using the standard protocol following the bolus administration of intravenous contrast. CONTRAST:  28mL ISOVUE-300 IOPAMIDOL (ISOVUE-300) INJECTION 61% COMPARISON:  None. FINDINGS: Pharynx and larynx: Diffuse  right parapharyngeal space inflammatory change and unorganized fluid with inflammation extending inferiorly in the neck in the carotid and visceral spaces. There is distortion of the right side of the oropharynx. The airway remains patent. Edema involves the right-sided epiglottis and aryepiglottic fold. No pharyngeal or laryngeal mass identified. Retropharyngeal edema and small retropharyngeal effusion. Salivary glands: Both submandibular glands are prominent in size, however the right is larger and appears edematous with surrounding submandibular space inflammation. There is marked diffuse asymmetric enlargement of the right parotid gland with patchy areas of hyperenhancement, most notably in the tail and deep lobe. Evaluation of the parotid glands (and evaluation for ductal stones) is limited by metallic dental streak artifact. There is prominent right periparotid inflammatory change which extends inferiorly throughout the right facial soft tissues and into the right neck. There is thickening of the platysma on the right. Thyroid: Multiple hypoattenuating thyroid nodules, with the largest measuring 11 mm in the isthmus. Lymph nodes: Multiple small right level II lymph nodes measure up to 7 mm in short axis and are likely reactive. Vascular: Major vascular structures of the neck appear patent. Atherosclerotic plaque is noted at the right greater than left carotid bifurcations without significant stenosis. Limited intracranial: Unremarkable. Visualized orbits: Not imaged. Mastoids and visualized paranasal sinuses: Partially visualized moderate volume secretions in the right maxillary sinus with mild mucosal thickening. Visualized mastoid air cells are clear. Skeleton: Cervical disc degeneration,  advanced at C6-7. Advanced left facet arthrosis at C3-4 with grade 1 anterolisthesis. Moderate atlantodental degenerative changes. Upper chest: No consolidation or mass in the visualized lung apices. IMPRESSION: 1. Marked enlargement and patchy hyperenhancement of the right parotid gland compatible with acute parotitis. Extensive inflammation throughout the right face and neck including right parapharyngeal space with edema in the epiglottis and aryepiglottic fold on the right. Patent airway. No abscess. 2. Right submandibular gland sialadenitis. Electronically Signed   By: Logan Bores M.D.   On: 11/08/2015 11:24   Dg Chest Portable 1 View  Result Date: 11/08/2015 CLINICAL DATA:  New onset atrial for ablation. EXAM: PORTABLE CHEST 1 VIEW COMPARISON:  None. FINDINGS: The heart size is enlarged. Mediastinal contour is normal. There is no focal infiltrate, pulmonary edema, or pleural effusion. The visualized skeletal structures are unremarkable. IMPRESSION: No active cardial pulmonary disease.  Cardiomegaly. Electronically Signed   By: Abelardo Diesel M.D.   On: 11/08/2015 09:03

## 2015-11-19 NOTE — Patient Instructions (Signed)
Your physician recommends that you schedule a follow-up appointment in: 3 Months   Your physician recommends that you continue on your current medications as directed. Please refer to the Current Medication list given to you today.  If you need a refill on your cardiac medications before your next appointment, please call your pharmacy.  Thank you for choosing [facility]!    

## 2015-11-20 ENCOUNTER — Other Ambulatory Visit: Payer: Self-pay | Admitting: *Deleted

## 2015-11-20 ENCOUNTER — Encounter: Payer: Self-pay | Admitting: Adult Health

## 2015-11-20 MED ORDER — APIXABAN 5 MG PO TABS: 5.0000 mg | ORAL_TABLET | Freq: Two times a day (BID) | ORAL | 6 refills | Status: DC

## 2015-11-25 ENCOUNTER — Ambulatory Visit: Payer: Medicare Other | Admitting: Family Medicine

## 2015-12-03 ENCOUNTER — Encounter: Payer: Self-pay | Admitting: Podiatry

## 2015-12-03 ENCOUNTER — Ambulatory Visit (INDEPENDENT_AMBULATORY_CARE_PROVIDER_SITE_OTHER): Payer: Medicare Other | Admitting: Podiatry

## 2015-12-03 DIAGNOSIS — M779 Enthesopathy, unspecified: Secondary | ICD-10-CM

## 2015-12-03 DIAGNOSIS — Q828 Other specified congenital malformations of skin: Secondary | ICD-10-CM | POA: Diagnosis not present

## 2015-12-03 MED ORDER — TRIAMCINOLONE ACETONIDE 10 MG/ML IJ SUSP
10.0000 mg | Freq: Once | INTRAMUSCULAR | Status: AC
  Administered 2015-12-03: 10 mg

## 2015-12-03 NOTE — Progress Notes (Signed)
Subjective:     Patient ID: Sara Terry, female   DOB: March 25, 1945, 71 y.o.   MRN: [id number]  HPI patient presents with pain around the fifth metatarsal head left with fluid buildup and keratotic tissue formation   Review of Systems     Objective:   Physical Exam Neurovascular status intact with inflammatory changes of the fifth metatarsal head left with fluid buildup and porokeratotic type tissue    Assessment:     Inflammatory capsulitis fifth MPJ left with lesion formation    Plan:     Discussed that ultimately this may require surgery but at this point I did inject the fifth MPJ 3 mg [place] some Kenalog and after appropriate numbness debrided lesions fully which was tolerated well. Reappoint to recheck

## 2015-12-23 ENCOUNTER — Encounter: Payer: Self-pay | Admitting: Family Medicine

## 2015-12-23 ENCOUNTER — Ambulatory Visit (INDEPENDENT_AMBULATORY_CARE_PROVIDER_SITE_OTHER): Payer: Medicare Other | Admitting: Family Medicine

## 2015-12-23 VITALS — BP 120/80 | Ht 65.5 in | Wt 177.2 lb

## 2015-12-23 DIAGNOSIS — I10 Essential (primary) hypertension: Secondary | ICD-10-CM | POA: Diagnosis not present

## 2015-12-23 MED ORDER — ENALAPRIL MALEATE 20 MG PO TABS: 20.0000 mg | ORAL_TABLET | Freq: Every day | ORAL | 5 refills | Status: DC

## 2015-12-23 MED ORDER — PANTOPRAZOLE SODIUM 40 MG PO TBEC: DELAYED_RELEASE_TABLET | ORAL | 5 refills | Status: DC

## 2015-12-23 MED ORDER — HYDROCHLOROTHIAZIDE 25 MG PO TABS: ORAL_TABLET | ORAL | 5 refills | Status: DC

## 2015-12-23 NOTE — Progress Notes (Signed)
   Subjective:    Patient ID: Sara Terry, female    DOB: Jan 03, 1946, 70 y.o.   MRN: [id number]  Hypertension  This is a chronic problem. The current episode started more than 1 year ago. The problem has been gradually improving since onset. There are no associated agents to hypertension. There are no known risk factors for coronary artery disease. Treatments tried: enalapril. The current treatment provides moderate improvement. There are no compliance problems.    Patient states that she has no concerns at this time.   Tolerating the meds now and handling new dx  Of a fib  Shooting towards the end of feb ealy march     Review of Systems    No headache, no major weight loss or weight gain, no chest pain no back pain abdominal pain no change in bowel habits complete ROS otherwise negative  Objective:   Physical Exam  Alert vitals stable, NAD. Blood pressure good on repeat. HEENT normal. Lungs clear. Heart Not regular rate and relatively control rhythm.       Assessment & Plan:  Impression 1 hypertension good control discussed #2 hyperlipidemia medications initiated by cardiologist. Discussed #3 atrial fibrillation nature of condition discussed plan maintain same meds. Diet exercise discussed recheck in 6 months

## 2015-12-24 ENCOUNTER — Ambulatory Visit: Payer: Medicare Other | Admitting: Family Medicine

## 2015-12-31 ENCOUNTER — Telehealth: Payer: Self-pay | Admitting: Family Medicine

## 2015-12-31 ENCOUNTER — Encounter: Payer: Self-pay | Admitting: Adult Health

## 2015-12-31 NOTE — Telephone Encounter (Signed)
Please complete pre-operative clearance form for [facility].  This is in your forms basket.

## 2016-01-05 ENCOUNTER — Other Ambulatory Visit: Payer: Self-pay | Admitting: Cardiology

## 2016-01-11 ENCOUNTER — Ambulatory Visit: Payer: Medicare Other | Admitting: Family Medicine

## 2016-01-13 ENCOUNTER — Inpatient Hospital Stay (HOSPITAL_COMMUNITY)
Admission: RE | Admit: 2016-01-13 | Payer: No Typology Code available for payment source | Source: Ambulatory Visit | Admitting: Orthopedic Surgery

## 2016-01-13 ENCOUNTER — Encounter (HOSPITAL_COMMUNITY): Admission: RE | Payer: Self-pay | Source: Ambulatory Visit

## 2016-01-13 SURGERY — ARTHROPLASTY, HIP, TOTAL, ANTERIOR APPROACH
Anesthesia: Choice | Site: Hip | Laterality: Right

## 2016-01-21 ENCOUNTER — Ambulatory Visit (INDEPENDENT_AMBULATORY_CARE_PROVIDER_SITE_OTHER): Payer: Medicare Other | Admitting: Podiatry

## 2016-01-21 ENCOUNTER — Ambulatory Visit (INDEPENDENT_AMBULATORY_CARE_PROVIDER_SITE_OTHER): Payer: Medicare Other

## 2016-01-21 DIAGNOSIS — L84 Corns and callosities: Secondary | ICD-10-CM | POA: Diagnosis not present

## 2016-01-21 DIAGNOSIS — M722 Plantar fascial fibromatosis: Secondary | ICD-10-CM

## 2016-01-21 DIAGNOSIS — M216X9 Other acquired deformities of unspecified foot: Secondary | ICD-10-CM | POA: Diagnosis not present

## 2016-01-21 DIAGNOSIS — Q828 Other specified congenital malformations of skin: Secondary | ICD-10-CM

## 2016-01-21 NOTE — Patient Instructions (Signed)
.tfcPre-Operative Instructions  Congratulations, you have decided to take an important step to improving your quality of life.  You can be assured that the doctors of [facility] will be with you every step of the way.  1. Plan to be at the surgery center/hospital at least 1 (one) hour prior to your scheduled time unless otherwise directed by the surgical center/hospital staff.  You must have a responsible adult accompany you, remain during the surgery and drive you home.  Make sure you have directions to the surgical center/hospital and know how to get there on time. 2. For hospital based surgery you will need to obtain a history and physical form from your family physician within 1 month prior to the date of surgery- we will give you a form for you primary physician.  3. We make every effort to accommodate the date you request for surgery.  There are however, times where surgery dates or times have to be moved.  We will contact you as soon as possible if a change in schedule is required.   4. No Aspirin/Ibuprofen for one week before surgery.  If you are on aspirin, any non-steroidal anti-inflammatory medications (Mobic, Aleve, Ibuprofen) you should stop taking it 7 days prior to your surgery.  You make take Tylenol  For pain prior to surgery.  5. Medications- If you are taking daily heart and blood pressure medications, seizure, reflux, allergy, asthma, anxiety, pain or diabetes medications, make sure the surgery center/hospital is aware before the day of surgery so they may notify you which medications to take or avoid the day of surgery. 6. No food or drink after midnight the night before surgery unless directed otherwise by surgical center/hospital staff. 7. No alcoholic beverages 24 hours prior to surgery.  No smoking 24 hours prior to or 24 hours after surgery. 8. Wear loose pants or shorts- loose enough to fit over bandages, boots, and casts. 9. No slip on shoes, sneakers are  best. 10. Bring your boot with you to the surgery center/hospital.  Also bring crutches or a walker if your physician has prescribed it for you.  If you do not have this equipment, it will be provided for you after surgery. 11. If you have not been contracted by the surgery center/hospital by the day before your surgery, call to confirm the date and time of your surgery. 12. Leave-time from work may vary depending on the type of surgery you have.  Appropriate arrangements should be made prior to surgery with your employer. 13. Prescriptions will be provided immediately following surgery by your doctor.  Have these filled as soon as possible after surgery and take the medication as directed. 14. Remove nail polish on the operative foot. 15. Wash the night before surgery.  The night before surgery wash the foot and leg well with the antibacterial soap provided and water paying special attention to beneath the toenails and in between the toes.  Rinse thoroughly with water and dry well with a towel.  Perform this wash unless told not to do so by your physician.  Enclosed: 1 Ice pack (please put in freezer the night before surgery)   1 Hibiclens skin cleaner   Pre-op Instructions  If you have any questions regarding the instructions, do not hesitate to call our office.  [place]: [address] [place], [place] [address] [phone]  [place]: [address] [place], [place] [address] [phone]  [place]: [address]  [place], [place] [address] [phone]   Dr.   Norman Regal DPM, Dr. Matthew Wagoner DPM, Dr. M. Todd Hyatt DPM, Dr. Titorya Stover DPM 

## 2016-01-21 NOTE — Progress Notes (Signed)
Subjective:     Patient ID: Sara Terry, female   DOB: Dec 24, 1945, 70 y.o.   MRN: [id number]  HPI patient states she is not getting relief for this area on the bottom of the left foot despite his trimmings injections and reduced weightbearing against it. She states remains very tender and she needs to have something done   Review of Systems     Objective:   Physical Exam Neurovascular status intact muscle strength adequate with exquisite discomfort fifth metatarsal head left with keratotic lesion formation fluid buildup and pain    Assessment:     Chronic lesion secondary to bone structure fifth metatarsal left with chronic lesion formation    Plan:     H&P condition reviewed and at this point I do think fifth metatarsal head resection along with excision of lesion with skin wedge resection is necessary left. I explained procedure and risk and she wants this done and I allowed her to read consent form going over alternative treatments complications and the fact there is absolutely no long-term guarantees this will solve the problem. Patient wants surgery signed consent form is given all preoperative instructions and is scheduled for outpatient surgery  K that it appears to be surrounding and enveloping the fifth metatarsal head left

## 2016-02-03 ENCOUNTER — Telehealth: Payer: Self-pay | Admitting: *Deleted

## 2016-02-03 NOTE — Telephone Encounter (Addendum)
Pt states she is scheduled for surgery 02/09/2016 and she is on Eliquis, what should she do? 02/05/2016-I spoke with pt, she states she only took one Eliquis yesterday. I told her not to take anymore until Dr. Paulla Dolly instructs her at the time of surgery. Pt states understanding and that she had the 2 appts that were in My Chart in her calendar.

## 2016-02-04 ENCOUNTER — Encounter: Payer: Self-pay | Admitting: Podiatry

## 2016-02-04 NOTE — Telephone Encounter (Signed)
Stop tomorrow

## 2016-02-05 ENCOUNTER — Other Ambulatory Visit: Payer: Self-pay | Admitting: Adult Health

## 2016-02-05 ENCOUNTER — Encounter: Payer: Self-pay | Admitting: Podiatry

## 2016-02-09 ENCOUNTER — Encounter: Payer: Self-pay | Admitting: Podiatry

## 2016-02-09 DIAGNOSIS — M21542 Acquired clubfoot, left foot: Secondary | ICD-10-CM | POA: Diagnosis not present

## 2016-02-09 DIAGNOSIS — Q899 Congenital malformation, unspecified: Secondary | ICD-10-CM | POA: Diagnosis not present

## 2016-02-09 HISTORY — PX: FOOT SURGERY: SHX648

## 2016-02-15 ENCOUNTER — Ambulatory Visit (INDEPENDENT_AMBULATORY_CARE_PROVIDER_SITE_OTHER): Payer: Medicare Other | Admitting: Podiatry

## 2016-02-15 ENCOUNTER — Encounter: Payer: Self-pay | Admitting: Podiatry

## 2016-02-15 ENCOUNTER — Ambulatory Visit (INDEPENDENT_AMBULATORY_CARE_PROVIDER_SITE_OTHER): Payer: Medicare Other

## 2016-02-15 VITALS — Temp 97.7°F

## 2016-02-15 DIAGNOSIS — L84 Corns and callosities: Secondary | ICD-10-CM | POA: Diagnosis not present

## 2016-02-15 DIAGNOSIS — M216X9 Other acquired deformities of unspecified foot: Secondary | ICD-10-CM

## 2016-02-16 NOTE — Progress Notes (Signed)
Subjective:     Patient ID: Sara Terry, female   DOB: 05-08-1945, 70 y.o.   MRN: [id number]  HPI patient presents stating it has been pretty painful that I'm doing well overall and I'm walking now with normal   Review of Systems     Objective:   Physical Exam Neurovascular status intact with excellent healing of incision site dorsal fifth metatarsal plantar fifth metatarsal with wound edges well coapted stitches in place and no drainage    Assessment:     Doing well post fifth metatarsal head resection left with removal of plantar lesion    Plan:     H&P condition reviewed and reapplied sterile dressing and instructed on continued reduced activity immobilization. Reappoint 2 weeks suture removal or earlier if needed  X-ray report indicates good resection had a fifth metatarsal left with good structural position noted

## 2016-02-18 ENCOUNTER — Ambulatory Visit (INDEPENDENT_AMBULATORY_CARE_PROVIDER_SITE_OTHER): Payer: Medicare Other | Admitting: Adult Health

## 2016-02-18 ENCOUNTER — Telehealth: Payer: Self-pay | Admitting: *Deleted

## 2016-02-18 ENCOUNTER — Encounter: Payer: Self-pay | Admitting: Adult Health

## 2016-02-18 VITALS — BP 120/78 | HR 115 | Ht 65.0 in | Wt 171.0 lb

## 2016-02-18 DIAGNOSIS — R000 Tachycardia, unspecified: Secondary | ICD-10-CM | POA: Diagnosis not present

## 2016-02-18 DIAGNOSIS — I482 Chronic atrial fibrillation: Secondary | ICD-10-CM

## 2016-02-18 DIAGNOSIS — I4821 Permanent atrial fibrillation: Secondary | ICD-10-CM

## 2016-02-18 DIAGNOSIS — I10 Essential (primary) hypertension: Secondary | ICD-10-CM

## 2016-02-18 DIAGNOSIS — Z01810 Encounter for preprocedural cardiovascular examination: Secondary | ICD-10-CM | POA: Diagnosis not present

## 2016-02-18 NOTE — Progress Notes (Signed)
Cardiology Office Note   Date:  02/18/2016   ID:  Sara Terry, Sara Terry 11/23/1945, MRN [id number]  PCP:  Mickie Hillier, MD  Cardiologist:  Woodroe Chen, NP   Chief Complaint  Patient presents with  . Atrial Fibrillation  . Hypertension      History of Present Illness: Sara Terry is a 70 y.o. female who presents for ongoing assessment and management of atrial fibrillation with RVR, hypertension, hyperlipidemia. The patient was last seen in the office on 11/19/2015 after being seen in the hospital for rapid heart rhythm in the setting of parotitis. She is on ELIQUIS 5 mg twice a day with a CHADS VASC score of 3  Left ventricle: The cavity size was normal. Wall thickness was normal. Systolic function was normal. The estimated ejection fraction was in the range of 60% to 65%. Wall motion was normal; there were no regional wall motion abnormalities. The study is not technically sufficient to allow evaluation of LV diastolic function. - Mitral valve: Mildly calcified annulus. There was moderate regurgitation. - Left atrium: The atrium was moderately dilated. - Right ventricle: Systolic function was mildly reduced. - Right atrium: The atrium was moderately dilated. - Tricuspid valve: There was moderate regurgitation.  On last office visit she was stable, there was concern about cost of ELIQUIS, she was referred for drug assistance program.  She has since been able to afford her medication as her insurance is paying for it.   She is here today for preoperative evaluation. She is having a planned total hip replacement on the right by Dr.Alusio, 04/27/2016. The patient is completely asymptomatic from a cardiac standpoint. No chest pain, no shortness of breath, no fatigue dizziness or palpitations. The patient cannot feel that she is having an irregular heart rhythm. She has no bleeding issues on ELIQUIS.  Past Medical History:  Diagnosis Date  . Glucose  intolerance (impaired glucose tolerance)   . Hypertension   . Insomnia   . Raynaud phenomenon   . Reflux   . Skin cancer    Forehead    Past Surgical History:  Procedure Laterality Date  . BREAST REDUCTION SURGERY  2003  . COLONOSCOPY    . COLONOSCOPY N/A 06/13/2013   Procedure: COLONOSCOPY;  Surgeon: Rogene Houston, MD;  Location: [facility];  Service: Endoscopy;  Laterality: N/A;  930  . TONSILLECTOMY    . TUBAL LIGATION       Current Outpatient Prescriptions  Medication Sig Dispense Refill  . apixaban (ELIQUIS) 5 MG TABS tablet Take 1 tablet (5 mg total) by mouth 2 (two) times daily. 60 tablet 6  . atorvastatin (LIPITOR) 40 MG tablet TAKE ONE TABLET BY MOUTH DAILY AT 6 PM. 30 tablet 3  . calcium carbonate (OS-CAL - DOSED IN MG OF ELEMENTAL CALCIUM) 1250 (500 Ca) MG tablet Take 1 tablet by mouth daily with breakfast.    . diltiazem (CARDIZEM CD) 240 MG 24 hr capsule TAKE ONE CAPSULE BY MOUTH DAILY. 30 capsule 3  . docusate sodium (COLACE) 100 MG capsule Take 100 mg by mouth daily.    . enalapril (VASOTEC) 20 MG tablet Take 1 tablet (20 mg total) by mouth daily. 30 tablet 5  . hydrochlorothiazide (HYDRODIURIL) 25 MG tablet TAKE (1) TABLET BY MOUTH EACH MORNING. 30 tablet 5  . pantoprazole (PROTONIX) 40 MG tablet TAKE (1) TABLET BY MOUTH EACH MORNING. 30 tablet 5  . zolpidem (AMBIEN) 10 MG tablet Take 1 tablet (10 mg total) by  mouth at bedtime as needed for sleep. 30 tablet 5   No current facility-administered medications for this visit.     Allergies:   Maxzide [triamterene-hctz] and Penicillins    Social History:  The patient  reports that she has never smoked. She has never used smokeless tobacco. She reports that she drinks about 1.2 oz of alcohol per week . She reports that she does not use drugs.   Family History:  The patient's family history includes Diabetes in her father; Heart attack in her father; Hypertension in her brother, father, and mother.    ROS: All  other systems are reviewed and negative. Unless otherwise mentioned in H&P    PHYSICAL EXAM: VS:  Ht 5\' 5"  (1.651 m)   Wt 171 lb (77.6 kg)   BMI 28.46 kg/m  , BMI Body mass index is 28.46 kg/m. GEN: Well nourished, well developed, in no acute distress  HEENT: normal  Neck: no JVD, carotid bruits, or masses Cardiac: IRRR; no murmurs, rubs, or gallops,no edema  Respiratory:  clear to auscultation bilaterally, normal work of breathing GI: soft, nontender, nondistended, + BS MS: no deformity or atrophy left foot brace from removal of bone spur in place. Walking cast Skin: warm and dry, no rash Neuro:  Strength and sensation are intact Psych: euthymic mood, full affect   EKG: Atrial fibrillation heart rate of 87 bpm. No acute ST-T wave changes  Recent Labs: 11/08/2015: ALT 17; TSH 1.471 11/10/2015: Hemoglobin 10.9; Magnesium 1.7; Platelets 223 11/11/2015: BUN 10; Creatinine, Ser 0.59; Potassium 4.6; Sodium 135    Lipid Panel    Component Value Date/Time   CHOL 195 07/09/2015 0813   TRIG 86 07/09/2015 0813   HDL 61 07/09/2015 0813   CHOLHDL 3.2 07/09/2015 0813   CHOLHDL 3.1 10/26/2012 0955   VLDL 10 10/26/2012 0955   LDLCALC 117 (H) 07/09/2015 0813      Wt Readings from Last 3 Encounters:  02/18/16 171 lb (77.6 kg)  12/23/15 177 lb 4 oz (80.4 kg)  11/19/15 181 lb (82.1 kg)     ASSESSMENT AND PLAN:  1.  Atrial fibrillation: Heart rate is currently fairly well-controlled. She is a little nervous being here and her heart rate is up slightly. Blood pressure is well-controlled as well. She has no issues with bleeding or excessive bruising on ELIQUIS. CHADS VASC Score of 3. She will need to hold Eliquis a minimum of 48 hours prior to [facility], last dose on 05/05/2015. Begin Eliquis ASAP, preferably on the day of surgery post-operatively, if surgeon is comfortable with this. No bridging in necessary as she has not had a history of a CVA. Please continue AV nodal blocking agent, diltiazem  perioperatively as she will have catecholamine surge during surgery and postoperatively. Titration of heart rate medication may be necessary in recovery. Cardiology will be available for consultation should this be necessary.  2. Hypertension: Continue diltiazem, enalapril, and HCTZ. She will need a BMET to evaluate for kidney function and a CBC to check for anemia and she is also on ELIQUIS. She will be having some preoperative labs completed and therefore we'll defer these labs to physician performing surgery.  3. Planned right total knee replacement: This is planned for 04/27/2016. She is stable from a cardiac standpoint with no planned ischemic workup at this time as she is completely asymptomatic. Echocardiogram is discussed above. She is of a low but acceptable risk for hip replacement surgery from a cardiac standpoint.    Current medicines  are reviewed at length with the patient today.    Labs/ tests ordered today include: Recommend CBC and BMET-defer to surgery  No orders of the defined types were placed in this encounter.    Disposition:   FU with cardiology in 4-6 months postoperatively. Cardiology available perioperatively if needed.  Signed, Jory Sims, NP  02/18/2016 1:08 PM    [facility] [address] [place], [place] [address] Phone: [phone]; Fax: [phone]

## 2016-02-18 NOTE — Progress Notes (Signed)
Name: Sara Terry    DOB: [birth year]  Age: 70 y.o.  MR#: [id number]       PCP:  Mickie Hillier, MD      Insurance: Payor: [employer] MEDICARE / Plan: [facility] MEDICARE / Product Type: *No Product type* /   CC:    Chief Complaint  Patient presents with  . Atrial Fibrillation  . Hypertension    VS Vitals:   02/18/16 1307  Weight: 171 lb (77.6 kg)  Height: 5\' 5"  (1.651 m)    Weights Current Weight  02/18/16 171 lb (77.6 kg)  12/23/15 177 lb 4 oz (80.4 kg)  11/19/15 181 lb (82.1 kg)    Blood Pressure  BP Readings from Last 3 Encounters:  12/23/15 120/80  11/19/15 118/72  11/11/15 130/79     Admit date:  (Not on file) Last encounter with RMR:  02/05/2016   Allergy Maxzide [triamterene-hctz] and Penicillins  Current Outpatient Prescriptions  Medication Sig Dispense Refill  . apixaban (ELIQUIS) 5 MG TABS tablet Take 1 tablet (5 mg total) by mouth 2 (two) times daily. 60 tablet 6  . atorvastatin (LIPITOR) 40 MG tablet TAKE ONE TABLET BY MOUTH DAILY AT 6 PM. 30 tablet 3  . calcium carbonate (OS-CAL - DOSED IN MG OF ELEMENTAL CALCIUM) 1250 (500 Ca) MG tablet Take 1 tablet by mouth daily with breakfast.    . diltiazem (CARDIZEM CD) 240 MG 24 hr capsule TAKE ONE CAPSULE BY MOUTH DAILY. 30 capsule 3  . docusate sodium (COLACE) 100 MG capsule Take 100 mg by mouth daily.    . enalapril (VASOTEC) 20 MG tablet Take 1 tablet (20 mg total) by mouth daily. 30 tablet 5  . hydrochlorothiazide (HYDRODIURIL) 25 MG tablet TAKE (1) TABLET BY MOUTH EACH MORNING. 30 tablet 5  . pantoprazole (PROTONIX) 40 MG tablet TAKE (1) TABLET BY MOUTH EACH MORNING. 30 tablet 5  . zolpidem (AMBIEN) 10 MG tablet Take 1 tablet (10 mg total) by mouth at bedtime as needed for sleep. 30 tablet 5   No current facility-administered medications for this visit.     Discontinued Meds:   There are no discontinued medications.  Patient Active Problem List   Diagnosis Date Noted  . New onset a-fib ([place]) 11/08/2015   . HLD (hyperlipidemia) 10/26/2012  . Essential hypertension, benign 10/26/2012  . Impaired fasting glucose 10/26/2012  . Insomnia 10/26/2012    LABS    Component Value Date/Time   NA 135 11/11/2015 0557   NA 130 (L) 11/08/2015 0840   NA 138 07/09/2015 0813   NA 136 07/04/2014 0849   NA 138 10/26/2012 0955   K 4.6 11/11/2015 0557   K 3.3 (L) 11/08/2015 0840   K 4.2 07/09/2015 0813   CL 107 11/11/2015 0557   CL 93 (L) 11/08/2015 0840   CL 96 07/09/2015 0813   CO2 22 11/11/2015 0557   CO2 23 11/08/2015 0840   CO2 23 07/09/2015 0813   GLUCOSE 107 (H) 11/11/2015 0557   GLUCOSE 162 (H) 11/08/2015 0840   GLUCOSE 119 (H) 07/09/2015 0813   GLUCOSE 121 (H) 07/04/2014 0849   GLUCOSE 105 (H) 10/26/2012 0955   BUN 10 11/11/2015 0557   BUN 10 11/08/2015 0840   BUN 15 07/09/2015 0813   BUN 16 07/04/2014 0849   BUN 14 10/26/2012 0955   CREATININE 0.59 11/11/2015 0557   CREATININE 0.68 11/08/2015 0840   CREATININE 0.64 07/09/2015 0813   CREATININE 0.60 10/26/2012 0955   CALCIUM 8.9 11/11/2015  0557   CALCIUM 9.1 11/08/2015 0840   CALCIUM 9.1 07/09/2015 0813   GFRNONAA >60 11/11/2015 0557   GFRNONAA >60 11/08/2015 0840   GFRNONAA 92 07/09/2015 0813   GFRAA >60 11/11/2015 0557   GFRAA >60 11/08/2015 0840   GFRAA 106 07/09/2015 0813   CMP     Component Value Date/Time   NA 135 11/11/2015 0557   NA 138 07/09/2015 0813   K 4.6 11/11/2015 0557   CL 107 11/11/2015 0557   CO2 22 11/11/2015 0557   GLUCOSE 107 (H) 11/11/2015 0557   BUN 10 11/11/2015 0557   BUN 15 07/09/2015 0813   CREATININE 0.59 11/11/2015 0557   CREATININE 0.60 10/26/2012 0955   CALCIUM 8.9 11/11/2015 0557   PROT 6.8 11/08/2015 0840   PROT 6.4 07/09/2015 0813   ALBUMIN 4.2 11/08/2015 0840   ALBUMIN 4.3 07/09/2015 0813   AST 18 11/08/2015 0840   ALT 17 11/08/2015 0840   ALKPHOS 56 11/08/2015 0840   BILITOT 0.9 11/08/2015 0840   BILITOT 0.3 07/09/2015 0813   GFRNONAA >60 11/11/2015 0557   GFRAA >60  11/11/2015 0557       Component Value Date/Time   WBC 7.3 11/10/2015 0524   WBC 9.1 11/09/2015 0512   WBC 10.0 11/08/2015 0840   HGB 10.9 (L) 11/10/2015 0524   HGB 11.6 (L) 11/09/2015 0512   HGB 13.3 11/08/2015 0840   HCT 30.4 (L) 11/10/2015 0524   HCT 32.8 (L) 11/09/2015 0512   HCT 36.7 11/08/2015 0840   MCV 92.4 11/10/2015 0524   MCV 92.9 11/09/2015 0512   MCV 91.8 11/08/2015 0840    Lipid Panel     Component Value Date/Time   CHOL 195 07/09/2015 0813   TRIG 86 07/09/2015 0813   HDL 61 07/09/2015 0813   CHOLHDL 3.2 07/09/2015 0813   CHOLHDL 3.1 10/26/2012 0955   VLDL 10 10/26/2012 0955   LDLCALC 117 (H) 07/09/2015 0813    ABG No results found for: PHART, PCO2ART, PO2ART, HCO3, TCO2, ACIDBASEDEF, O2SAT   Lab Results  Component Value Date   TSH 1.471 11/08/2015   BNP (last 3 results) No results for input(s): BNP in the last 8760 hours.  ProBNP (last 3 results) No results for input(s): PROBNP in the last 8760 hours.  Cardiac Panel (last 3 results) No results for input(s): CKTOTAL, CKMB, TROPONINI, RELINDX in the last 72 hours.  Iron/TIBC/Ferritin/ %Sat No results found for: IRON, TIBC, FERRITIN, IRONPCTSAT   EKG Orders placed or performed during the hospital encounter of 11/08/15  . EKG 12-Lead  . EKG 12-Lead  . EKG     Prior Assessment and Plan Problem List as of 02/18/2016 Reviewed: 02/15/2016 10:16 AM by Virl Axe, DPM     Cardiovascular and Mediastinum   Essential hypertension, benign   New onset a-fib (H[facility])     Endocrine   Impaired fasting glucose     Other   HLD (hyperlipidemia)   Insomnia       Imaging: No results found.

## 2016-02-18 NOTE — Telephone Encounter (Signed)
Pre-operative clearance faxed to [facility] @ [phone]. Copy given to pt during office visit today.

## 2016-02-18 NOTE — Patient Instructions (Addendum)
Your physician wants you to follow-up in: 6 Months with Dr. Bronson Ing. You will receive a reminder letter in the mail two months in advance. If you don't receive a letter, please call our office to schedule the follow-up appointment.  You have been cleared for Hip surgery in February  Hold Eliquis 04/24/16 - Restart ASAP post Surgery  Your physician recommends that you continue on your current medications as directed. Please refer to the Current Medication list given to you today.  If you need a refill on your cardiac medications before your next appointment, please call your pharmacy.  Thank you for choosing [facility]!

## 2016-02-22 NOTE — Progress Notes (Signed)
DOS 12.05.2017 5th Metatarsal Head Resection Left; Excision Skin Wedge Left

## 2016-02-25 ENCOUNTER — Ambulatory Visit (INDEPENDENT_AMBULATORY_CARE_PROVIDER_SITE_OTHER): Payer: Self-pay | Admitting: Podiatry

## 2016-02-25 DIAGNOSIS — Q828 Other specified congenital malformations of skin: Secondary | ICD-10-CM

## 2016-02-25 DIAGNOSIS — M216X9 Other acquired deformities of unspecified foot: Secondary | ICD-10-CM

## 2016-02-25 DIAGNOSIS — M779 Enthesopathy, unspecified: Secondary | ICD-10-CM

## 2016-02-25 NOTE — Progress Notes (Signed)
She presents today for follow-up surgical foot left. His surgery 02/09/2016 with metatarsal head resection fifth left and excision of soft tissue lesion plantar lateral aspect of the foot. She denies fever chills nausea vomiting muscle aches and pains. States that she has been very good about keeping it elevated and dry.  Objective: Vital signs are stable and vascular dressing intact was removed demonstrates no erythema cellulitis drainage or odor. Sutures are intact margins are well coapted margins remain well coapted with sutures were removed. There is no signs of dehiscence no signs of infection.  Assessment: Well healing surgical foot left.  Plan: I highly recommend that she follow-up with Dr. Jeris Penta within the next week or 2. Till then I will like for her to keep this dry and clean.

## 2016-03-08 ENCOUNTER — Other Ambulatory Visit: Payer: Self-pay

## 2016-03-08 ENCOUNTER — Encounter: Payer: Self-pay | Admitting: Adult Health

## 2016-03-08 ENCOUNTER — Encounter: Payer: Self-pay | Admitting: Family Medicine

## 2016-03-08 MED ORDER — ENALAPRIL MALEATE 20 MG PO TABS: 20.0000 mg | ORAL_TABLET | Freq: Every day | ORAL | 5 refills | Status: DC

## 2016-03-08 MED ORDER — APIXABAN 5 MG PO TABS: 5.0000 mg | ORAL_TABLET | Freq: Two times a day (BID) | ORAL | 6 refills | Status: DC

## 2016-03-08 MED ORDER — DILTIAZEM HCL ER COATED BEADS 240 MG PO CP24
240.0000 mg | ORAL_CAPSULE | Freq: Every day | ORAL | 3 refills | Status: DC
Start: 2016-03-08 — End: 2016-07-26

## 2016-03-08 MED ORDER — PANTOPRAZOLE SODIUM 40 MG PO TBEC: DELAYED_RELEASE_TABLET | ORAL | 1 refills | Status: DC

## 2016-03-08 MED ORDER — HYDROCHLOROTHIAZIDE 25 MG PO TABS: ORAL_TABLET | ORAL | 5 refills | Status: DC

## 2016-03-08 MED ORDER — ATORVASTATIN CALCIUM 40 MG PO TABS: ORAL_TABLET | ORAL | 3 refills | Status: DC

## 2016-03-09 ENCOUNTER — Ambulatory Visit (INDEPENDENT_AMBULATORY_CARE_PROVIDER_SITE_OTHER): Payer: Self-pay | Admitting: Podiatry

## 2016-03-09 ENCOUNTER — Encounter: Payer: Self-pay | Admitting: Podiatry

## 2016-03-09 ENCOUNTER — Ambulatory Visit (INDEPENDENT_AMBULATORY_CARE_PROVIDER_SITE_OTHER): Payer: Medicare HMO

## 2016-03-09 VITALS — BP 144/99 | HR 54 | Resp 16

## 2016-03-09 DIAGNOSIS — M216X9 Other acquired deformities of unspecified foot: Secondary | ICD-10-CM

## 2016-03-10 NOTE — Progress Notes (Signed)
Subjective:     Patient ID: Sara Terry, female   DOB: 03/18/1945, 71 y.o.   MRN: [id number]  HPI patient states that she's doing real well with the left foot with minimal discomfort and lesion that has resolved   Review of Systems     Objective:   Physical Exam Neurovascular status intact muscle strength adequate with well-healed surgical site left fifth metatarsal left plantar fifth metatarsal with wound edges well coapted and no drainage    Assessment:     Doing well post fifth metatarsal head resection left    Plan:     X-ray reviewed and allow patient to return to normal activity and will be seen back as needed  X-ray indicates satisfactory section of had a fifth metatarsal

## 2016-04-01 ENCOUNTER — Encounter: Payer: Self-pay | Admitting: Podiatry

## 2016-04-15 ENCOUNTER — Ambulatory Visit: Payer: Self-pay | Admitting: Orthopedic Surgery

## 2016-04-19 NOTE — Patient Instructions (Addendum)
Sara Terry  04/19/2016   Your procedure is scheduled on: Wednesday 04/28/2016  Report to [facility] Main  Entrance take [facility]  elevators to 3rd floor to  [place] at  0600  AM.  Call this number if you have problems the morning of surgery [phone]   Remember: ONLY 1 PERSON MAY GO WITH YOU TO SHORT STAY TO GET  READY MORNING OF [place].   Do not eat food or drink liquids :After Midnight.     Take these medicines the morning of surgery with A SIP OF WATER: Diltiazem (Cardiazem), Pantoprazole(Protonix)                                 You may not have any metal on your body including hair pins and              piercings  Do not wear jewelry, make-up, lotions, powders or perfumes, deodorant             Do not wear nail polish.  Do not shave  48 hours prior to surgery.     Do not bring valuables to the hospital. [name]  Contacts, dentures or bridgework may not be worn into surgery.  Leave suitcase in the car. After surgery it may be brought to your room.                  Please read over the following fact sheets you were given: _____________________________________________________________________             [name] - Preparing for Surgery Before surgery, you can play an important role.  Because skin is not sterile, your skin needs to be as free of germs as possible.  You can reduce the number of germs on your skin by washing with CHG (chlorahexidine gluconate) soap before surgery.  CHG is an antiseptic cleaner which kills germs and bonds with the skin to continue killing germs even after washing. Please DO NOT use if you have an allergy to CHG or antibacterial soaps.  If your skin becomes reddened/irritated stop using the CHG and inform your nurse when you arrive at Short Stay. Do not shave (including legs and underarms) for at least 48 hours prior to the first CHG shower.  You may  shave your face/neck. Please follow these instructions carefully:  1.  Shower with CHG Soap the night before surgery and the  morning of Surgery.  2.  If you choose to wash your hair, wash your hair first as usual with your  normal  shampoo.  3.  After you shampoo, rinse your hair and body thoroughly to remove the  shampoo.                           4.  Use CHG as you would any other liquid soap.  You can apply chg directly  to the skin and wash                       Gently with a scrungie or clean washcloth.  5.  Apply the CHG Soap to your body ONLY FROM THE NECK DOWN.  Do not use on face/ open                           Wound or open sores. Avoid contact with eyes, ears mouth and genitals (private parts).                       Wash face,  Genitals (private parts) with your normal soap.             6.  Wash thoroughly, paying special attention to the area where your surgery  will be performed.  7.  Thoroughly rinse your body with warm water from the neck down.  8.  DO NOT shower/wash with your normal soap after using and rinsing off  the CHG Soap.                9.  Pat yourself dry with a clean towel.            10.  Wear clean pajamas.            11.  Place clean sheets on your bed the night of your first shower and do not  sleep with pets. Day of Surgery : Do not apply any lotions/deodorants the morning of surgery.  Please wear clean clothes to the hospital/surgery center.  FAILURE TO FOLLOW THESE INSTRUCTIONS MAY RESULT IN THE CANCELLATION OF YOUR SURGERY PATIENT SIGNATURE_________________________________  NURSE SIGNATURE__________________________________  ________________________________________________________________________   Sara Terry  An incentive spirometer is a tool that can help keep your lungs clear and active. This tool measures how well you are filling your lungs with each breath. Taking long deep breaths may help reverse or decrease the chance of developing  breathing (pulmonary) problems (especially infection) following:  A long period of time when you are unable to move or be active. BEFORE THE PROCEDURE   If the spirometer includes an indicator to show your best effort, your nurse or respiratory therapist will set it to a desired goal.  If possible, sit up straight or lean slightly forward. Try not to slouch.  Hold the incentive spirometer in an upright position. INSTRUCTIONS FOR USE  1. Sit on the edge of your bed if possible, or sit up as far as you can in bed or on a chair. 2. Hold the incentive spirometer in an upright position. 3. Breathe out normally. 4. Place the mouthpiece in your mouth and seal your lips tightly around it. 5. Breathe in slowly and as deeply as possible, raising the piston or the ball toward the top of the column. 6. Hold your breath for 3-5 seconds or for as long as possible. Allow the piston or ball to fall to the bottom of the column. 7. Remove the mouthpiece from your mouth and breathe out normally. 8. Rest for a few seconds and repeat Steps 1 through 7 at least 10 times every 1-2 hours when you are awake. Take your time and take a few normal breaths between deep breaths. 9. The spirometer may include an indicator to show your best effort. Use the indicator as a goal to work toward during each repetition. 10. After each set of 10 deep breaths, practice coughing to be sure your lungs are clear. If you have an incision (the cut made at the time of surgery), support your incision when coughing by placing a pillow or rolled up towels firmly against it. Once you are able to get out of  bed, walk around indoors and cough well. You may stop using the incentive spirometer when instructed by your caregiver.  RISKS AND COMPLICATIONS  Take your time so you do not get dizzy or light-headed.  If you are in pain, you may need to take or ask for pain medication before doing incentive spirometry. It is harder to take a deep  breath if you are having pain. AFTER USE  Rest and breathe slowly and easily.  It can be helpful to keep track of a log of your progress. Your caregiver can provide you with a simple table to help with this. If you are using the spirometer at home, follow these instructions: [place] IF:   You are having difficultly using the spirometer.  You have trouble using the spirometer as often as instructed.  Your pain medication is not giving enough relief while using the spirometer.  You develop fever of 100.5 F (38.1 C) or higher. SEEK IMMEDIATE MEDICAL CARE IF:   You cough up bloody sputum that had not been present before.  You develop fever of 102 F (38.9 C) or greater.  You develop worsening pain at or near the incision site. MAKE SURE YOU:   Understand these instructions.  Will watch your condition.  Will get help right away if you are not doing well or get worse. Document Released: 07/04/2006 Document Revised: 05/16/2011 Document Reviewed: 09/04/2006 ExitCare Patient Information 2014 ExitCare, [place].   ________________________________________________________________________  WHAT IS A BLOOD TRANSFUSION? Blood Transfusion Information  A transfusion is the replacement of blood or some of its parts. Blood is made up of multiple cells which provide different functions.  Red blood cells carry oxygen and are used for blood loss replacement.  White blood cells fight against infection.  Platelets control bleeding.  Plasma helps clot blood.  Other blood products are available for specialized needs, such as hemophilia or other clotting disorders. BEFORE THE TRANSFUSION  Who gives blood for transfusions?   Healthy volunteers who are fully evaluated to make sure their blood is safe. This is blood bank blood. Transfusion therapy is the safest it has ever been in the practice of medicine. Before blood is taken from a donor, a complete history is taken to make sure  that person has no history of diseases nor engages in risky social behavior (examples are intravenous drug use or sexual activity with multiple partners). The donor's travel history is screened to minimize risk of transmitting infections, such as malaria. The donated blood is tested for signs of infectious diseases, such as HIV and hepatitis. The blood is then tested to be sure it is compatible with you in order to minimize the chance of a transfusion reaction. If you or a relative donates blood, this is often done in anticipation of surgery and is not appropriate for emergency situations. It takes many days to process the donated blood. RISKS AND COMPLICATIONS Although transfusion therapy is very safe and saves many lives, the main dangers of transfusion include:   Getting an infectious disease.  Developing a transfusion reaction. This is an allergic reaction to something in the blood you were given. Every precaution is taken to prevent this. The decision to have a blood transfusion has been considered carefully by your caregiver before blood is given. Blood is not given unless the benefits outweigh the risks. AFTER THE TRANSFUSION  Right after receiving a blood transfusion, you will usually feel much better and more energetic. This is especially true if your red blood  cells have gotten low (anemic). The transfusion raises the level of the red blood cells which carry oxygen, and this usually causes an energy increase.  The nurse administering the transfusion will monitor you carefully for complications. HOME CARE INSTRUCTIONS  No special instructions are needed after a transfusion. You may find your energy is better. Speak with your caregiver about any limitations on activity for underlying diseases you may have. SEEK MEDICAL CARE IF:   Your condition is not improving after your transfusion.  You develop redness or irritation at the intravenous (IV) site. SEEK IMMEDIATE MEDICAL CARE IF:  Any of  the following symptoms occur over the next 12 hours:  Shaking chills.  You have a temperature by mouth above 102 F (38.9 C), not controlled by medicine.  Chest, back, or muscle pain.  People around you feel you are not acting correctly or are confused.  Shortness of breath or difficulty breathing.  Dizziness and fainting.  You get a rash or develop hives.  You have a decrease in urine output.  Your urine turns a dark color or changes to pink, red, or brown. Any of the following symptoms occur over the next 10 days:  You have a temperature by mouth above 102 F (38.9 C), not controlled by medicine.  Shortness of breath.  Weakness after normal activity.  The white part of the eye turns yellow (jaundice).  You have a decrease in the amount of urine or are urinating less often.  Your urine turns a dark color or changes to pink, red, or brown. Document Released: 02/19/2000 Document Revised: 05/16/2011 Document Reviewed: 10/08/2007 [facility] Patient Information 2014 [place], [place].  _______________________________________________________________________

## 2016-04-20 NOTE — Progress Notes (Signed)
02/18/16- noted in EPIC- Pre-operative clearance note from Hershal Coria for Dr. Bronson Ing, and EKG ( Atrial Fib.) 11/10/2015- noted in EPIC- ECHO 11/08/2015- noted in EPIC- CXR 1 view Pre-operative clearance from Dr. Wolfgang Phoenix on chart.

## 2016-04-21 ENCOUNTER — Encounter (HOSPITAL_COMMUNITY): Payer: Self-pay

## 2016-04-21 ENCOUNTER — Encounter (HOSPITAL_COMMUNITY)
Admission: RE | Admit: 2016-04-21 | Discharge: 2016-04-21 | Disposition: A | Discharge status: Home / Self Care | Payer: Medicare HMO | Source: Ambulatory Visit | Attending: Orthopedic Surgery | Admitting: Orthopedic Surgery

## 2016-04-21 DIAGNOSIS — Z0183 Encounter for blood typing: Secondary | ICD-10-CM | POA: Insufficient documentation

## 2016-04-21 DIAGNOSIS — M1611 Unilateral primary osteoarthritis, right hip: Secondary | ICD-10-CM | POA: Insufficient documentation

## 2016-04-21 DIAGNOSIS — Z01812 Encounter for preprocedural laboratory examination: Secondary | ICD-10-CM | POA: Insufficient documentation

## 2016-04-21 HISTORY — DX: Gastro-esophageal reflux disease without esophagitis: K21.9

## 2016-04-21 HISTORY — DX: Unspecified osteoarthritis, unspecified site: M19.90

## 2016-04-21 HISTORY — DX: Unspecified atrial fibrillation: I48.91

## 2016-04-21 LAB — COMPREHENSIVE METABOLIC PANEL
ALBUMIN: 4.4 g/dL (ref 3.5–5.0)
ALT: 21 U/L (ref 14–54)
AST: 22 U/L (ref 15–41)
Alkaline Phosphatase: 63 U/L (ref 38–126)
Anion gap: 9 (ref 5–15)
BILIRUBIN TOTAL: 0.6 mg/dL (ref 0.3–1.2)
BUN: 15 mg/dL (ref 6–20)
CO2: 29 mmol/L (ref 22–32)
Calcium: 9.8 mg/dL (ref 8.9–10.3)
Chloride: 99 mmol/L — ABNORMAL LOW (ref 101–111)
Creatinine, Ser: 0.59 mg/dL (ref 0.44–1.00)
GFR calc Af Amer: >60 mL/min (ref >60)
GFR calc non Af Amer: >60 mL/min (ref >60)
GLUCOSE: 106 mg/dL — AB (ref 65–99)
POTASSIUM: 5.2 mmol/L — AB (ref 3.5–5.1)
SODIUM: 137 mmol/L (ref 135–145)
TOTAL PROTEIN: 6.7 g/dL (ref 6.5–8.1)

## 2016-04-21 LAB — CBC
HEMATOCRIT: 36.9 % (ref 36.0–46.0)
Hemoglobin: 12.9 g/dL (ref 12.0–15.0)
MCH: 33.6 pg (ref 26.0–34.0)
MCHC: 35 g/dL (ref 30.0–36.0)
MCV: 96.1 fL (ref 78.0–100.0)
Platelets: 286 10*3/uL (ref 150–400)
RBC: 3.84 MIL/uL — ABNORMAL LOW (ref 3.87–5.11)
RDW: 12.3 % (ref 11.5–15.5)
WBC: 5.1 10*3/uL (ref 4.0–10.5)

## 2016-04-21 LAB — APTT: APTT: 33 s (ref 24–36)

## 2016-04-21 LAB — PROTIME-INR
INR: 1.22
Prothrombin Time: 15.5 seconds — ABNORMAL HIGH (ref 11.4–15.2)

## 2016-04-21 LAB — ABO/RH: ABO/RH(D): A POS

## 2016-04-21 LAB — SURGICAL PCR SCREEN
MRSA, PCR: NEGATIVE
Staphylococcus aureus: NEGATIVE

## 2016-04-21 NOTE — Progress Notes (Signed)
B/p at preop appt was 150/104 with a recheck of 156/101.  Patient denies any dizziness or lightheadedness or chest pain.  Patient stated she took am meds.  Instructed patient to keep eye on her blood pressure.  Patient was in some pain at time of preop appointment.

## 2016-04-21 NOTE — Progress Notes (Signed)
PT/INR done 04/21/16 faxed via epic to Dr Wynelle Link.  Will repeat PT/INR am of surgery.

## 2016-04-21 NOTE — Progress Notes (Signed)
CMP done 04/21/16 faxed via epic to DR Aluisio.

## 2016-04-21 NOTE — Progress Notes (Signed)
02/18/16- clearance- cardilogy in epic and ekg 02/18/16-epic  11/10/15- echo-epi c Clearance- dr Wolfgang Phoenix on chart

## 2016-04-26 ENCOUNTER — Ambulatory Visit: Payer: Self-pay | Admitting: Orthopedic Surgery

## 2016-04-26 NOTE — Anesthesia Preprocedure Evaluation (Addendum)
Anesthesia Evaluation  Patient identified by MRN, date of birth, ID band Patient awake    Reviewed: Allergy & Precautions, NPO status , Patient's Chart, lab work & pertinent test results  Airway Mallampati: II  TM Distance: >3 FB Neck ROM: Full    Dental   Pulmonary former smoker,    breath sounds clear to auscultation       Cardiovascular hypertension, Pt. on medications + dysrhythmias Atrial Fibrillation  Rhythm:Regular Rate:Normal  Left ventricle: The cavity size was normal. Wall thickness was   normal. Systolic function was normal. The estimated ejection   fraction was in the range of 60% to 65%. Wall motion was normal;   there were no regional wall motion abnormalities. The study is   not technically sufficient to allow evaluation of LV diastolic   function. - Mitral valve: Mildly calcified annulus. There was moderate   regurgitation. - Left atrium: The atrium was moderately dilated. - Right ventricle: Systolic function was mildly reduced. - Right atrium: The atrium was moderately dilated. - Tricuspid valve: There was moderate regurgitation.   Neuro/Psych negative neurological ROS     GI/Hepatic Neg liver ROS, GERD  ,  Endo/Other  negative endocrine ROS  Renal/GU negative Renal ROS     Musculoskeletal  (+) Arthritis ,   Abdominal   Peds  Hematology negative hematology ROS (+)   Anesthesia Other Findings   Reproductive/Obstetrics                            Lab Results  Component Value Date   WBC 5.1 04/21/2016   HGB 12.9 04/21/2016   HCT 36.9 04/21/2016   MCV 96.1 04/21/2016   PLT 286 04/21/2016   Lab Results  Component Value Date   CREATININE 0.59 04/21/2016   BUN 15 04/21/2016   NA 137 04/21/2016   K 5.2 (H) 04/21/2016   CL 99 (L) 04/21/2016   CO2 29 04/21/2016   Lab Results  Component Value Date   INR 0.94 04/27/2016   INR 1.22 04/21/2016   INR 1.01 11/08/2015     Anesthesia Physical Anesthesia Plan  ASA: II  Anesthesia Plan: Spinal   Post-op Pain Management:    Induction: Intravenous  Airway Management Planned: Natural Airway and Simple Face Mask  Additional Equipment: None  Intra-op Plan:   Post-operative Plan:   Informed Consent: I have reviewed the patients History and Physical, chart, labs and discussed the procedure including the risks, benefits and alternatives for the proposed anesthesia with the patient or authorized representative who has indicated his/her understanding and acceptance.     Plan Discussed with: CRNA  Anesthesia Plan Comments:        Anesthesia Quick Evaluation

## 2016-04-26 NOTE — H&P (Signed)
Sara Terry DOB: [birth year] Married / Language: English / Race: White Female Date of Admission:  04/27/2016  CC:  Right Hip Pain History of Present Illness The patient is a 71 year old female who comes in for a preoperative History and Physical. The patient is scheduled for a right total hip arthroplasty (anterior) to be performed by Dr. Dione Plover. Aluisio, MD at [facility] on 04-27-2016. The patient is a 71 year old female who presented for follow up of their hip. The patient is being followed for their right hip pain and osteoarthritis. They are month(s) out from IA injection. Symptoms reported include: pain and catching. The patient feels that they are doing poorly and report their pain level to be moderate (on and off). The following medication has been used for pain control: antiinflammatory medication. The patient has not gotten much relief of their symptoms with Cortisone injections. She had relief for about 1 week. She is still able to do most of her regular activities. She is still doing yoga. She has to cut down on how much she walks but she does still walk. Occasionally takes Aleve. She has good days and bad days. She is having pain in the right hip intermittently, but it has not changed much in the past six months or so. She said there were days where it could be rather difficult than other days where it does not bother her much at all. She has advanced arthritis int he hip. She tried to put off the surgery as long as she could. She is now at a stage where she wants to get this fixed. They have been treated conservatively in the past for the above stated problem and despite conservative measures, they continue to have progressive pain and severe functional limitations and dysfunction. They have failed non-operative management including home exercise, medications, and injections. It is felt that they would benefit from undergoing total joint replacement. Risks and benefits of the  procedure have been discussed with the patient and they elect to proceed with surgery. There are no active contraindications to surgery such as ongoing infection or rapidly progressive neurological disease.  Problem List/Past Medical Left knee pain (M25.562)  Chondrocalcinosis of left knee [id number])  Primary osteoarthritis of right hip (M16.11)  Allergic Urticaria  Gastroesophageal Reflux Disease  High blood pressure  Skin Cancer  Atrial Fibrillation  Allergies Penicillin V *PENICILLINS*  Hives. TraMADol HCl *ANALGESICS - OPIOID*  made her sick OxyCODONE HCl *ANALGESICS - OPIOID*  makes her sick  Family History  Heart Disease  father Hypertension  mother, father and brother  Social History Alcohol use  current drinker; drinks wine; 8-14 per week Children  2 Current work status  working part time Drug/Alcohol Rehab (Currently)  no Drug/Alcohol Rehab (Previously)  no Exercise  Exercises daily; does running / walking and other Illicit drug use  no Living situation  live with spouse Marital status  married Number of flights of stairs before winded  2-3 Pain Contract  no Tobacco / smoke exposure  no Tobacco use  former smoker; smoke(d) less than 1/2 pack(s) per day  Medication History  Docusate Sodium (100MG  Capsule, Oral) Active. (Colace) Zolpidem Tartrate (10MG  Tablet, Oral) Active. Pantoprazole Sodium (40MG  Tablet DR, Oral) Active. Hydrochlorothiazide (25MG  Tablet, Oral) Active. Calcium Carbonae Active. Enalapril Maleate (20MG  Tablet, Oral) Active. Eliquis (5MG  Tablet, Oral) Active. Atorvastatin Calcium (40MG  Tablet, Oral) Active. DilTIAZem CD (240MG  Capsule ER 24HR, Oral) Active.  Past Surgical History Mammoplasty; Reduction  bilateral  Review of Systems General Present- Weight Loss (attending [employer]). Not Present- Chills, Fatigue, Fever, Memory Loss, Night Sweats and Weight Gain. Skin Not Present- Eczema, Hives,  Itching, Lesions and Rash. HEENT Not Present- Dentures, Double Vision, Headache, Hearing Loss, Tinnitus and Visual Loss. Respiratory Not Present- Allergies, Chronic Cough, Coughing up blood, Shortness of breath at rest and Shortness of breath with exertion. Cardiovascular Not Present- Chest Pain, Difficulty Breathing Lying Down, Murmur, Palpitations, Racing/skipping heartbeats and Swelling. Gastrointestinal Not Present- Abdominal Pain, Bloody Stool, Constipation, Diarrhea, Difficulty Swallowing, Heartburn, Jaundice, Loss of appetitie, Nausea and Vomiting. Female Genitourinary Not Present- Blood in Urine, Discharge, Flank Pain, Incontinence, Painful Urination, Urgency, Urinary frequency, Urinary Retention, Urinating at Night and Weak urinary stream. Musculoskeletal Present- Joint Pain. Not Present- Back Pain, Joint Swelling, Morning Stiffness, Muscle Pain, Muscle Weakness and Spasms. Neurological Not Present- Blackout spells, Difficulty with balance, Dizziness, Paralysis, Tremor and Weakness. Psychiatric Not Present- Insomnia.  Vitals Weight: 170 lb Height: 65in Weight was reported by patient. Height was reported by patient. Body Surface Area: 1.85 m Body Mass Index: 28.29 kg/m  Pulse: 88 (Irregular)  BP: 128/78 (Sitting, Right Arm, Standard)  Physical Exam  General Mental Status -Alert, cooperative and good historian. General Appearance-pleasant, Not in acute distress. Orientation-Oriented X3. Build & Nutrition-Well nourished and Well developed.  Head and Neck Head-normocephalic, atraumatic . Neck Global Assessment - supple, no bruit auscultated on the right, no bruit auscultated on the left.  Eye Pupil - Bilateral-Regular and Round. Motion - Bilateral-EOMI.  Chest and Lung Exam Auscultation Breath sounds - clear at anterior chest wall and clear at posterior chest wall. Adventitious sounds - No Adventitious sounds.  Cardiovascular Auscultation Rhythm -  Irregularly irregular(known Atrial Fib.). Heart Sounds - S1 WNL and S2 WNL. Murmurs & Other Heart Sounds - Auscultation of the heart reveals - No Murmurs.  Abdomen Palpation/Percussion Tenderness - Abdomen is non-tender to palpation. Rigidity (guarding) - Abdomen is soft. Auscultation Auscultation of the abdomen reveals - Bowel sounds normal.  Female Genitourinary Note: Not done, not pertinent to present illness   Musculoskeletal Note: On exam, she is alert and oriented, in no apparent distress. Evaluation of her left hip, normal motion with no discomfort. Right hip shows flexion to about 100, rotation in 10, out 30, abduction 30 without discomfort.  RADIOGRAPHS AP pelvis and lateral of the right hip show advanced end-stage arthritis of the hip, but is unchanged compared to six months ago  Assessment & Plan  Primary osteoarthritis of right hip (M16.11)  Note:Surgical Plans: Right Total Hip Replacement - Anterior Approach  Disposition: Home  PCP: Dr. Wolfgang Phoenix - Patient has been seen preoperatively and felt to be stable for surgery. "Continue diltiazem. Hold Eliquis. Restart ASAP post surgery."  IV TXA  Anesthesia Issues: None  Patient was instructed on what medications to stop prior to surgery.  Signed electronically by Ok Edwards, III PA-C

## 2016-04-27 ENCOUNTER — Encounter (HOSPITAL_COMMUNITY): Payer: Self-pay | Admitting: *Deleted

## 2016-04-27 ENCOUNTER — Inpatient Hospital Stay (HOSPITAL_COMMUNITY): Payer: Medicare HMO

## 2016-04-27 ENCOUNTER — Inpatient Hospital Stay (HOSPITAL_COMMUNITY): Payer: Medicare HMO | Admitting: Anesthesiology

## 2016-04-27 ENCOUNTER — Encounter (HOSPITAL_COMMUNITY)
Admission: RE | Disposition: A | Discharge status: Home Health | Payer: Self-pay | Source: Ambulatory Visit | Attending: Orthopedic Surgery

## 2016-04-27 ENCOUNTER — Inpatient Hospital Stay (HOSPITAL_COMMUNITY)
Admission: RE | Admit: 2016-04-27 | Discharge: 2016-04-28 | DRG: 470 | Disposition: A | Discharge status: Home Health | Payer: Medicare HMO | Source: Ambulatory Visit | Attending: Orthopedic Surgery | Admitting: Orthopedic Surgery

## 2016-04-27 DIAGNOSIS — Z85828 Personal history of other malignant neoplasm of skin: Secondary | ICD-10-CM

## 2016-04-27 DIAGNOSIS — K219 Gastro-esophageal reflux disease without esophagitis: Secondary | ICD-10-CM | POA: Diagnosis present

## 2016-04-27 DIAGNOSIS — Z87891 Personal history of nicotine dependence: Secondary | ICD-10-CM

## 2016-04-27 DIAGNOSIS — Z885 Allergy status to narcotic agent status: Secondary | ICD-10-CM

## 2016-04-27 DIAGNOSIS — E785 Hyperlipidemia, unspecified: Secondary | ICD-10-CM | POA: Diagnosis not present

## 2016-04-27 DIAGNOSIS — M1710 Unilateral primary osteoarthritis, unspecified knee: Secondary | ICD-10-CM | POA: Insufficient documentation

## 2016-04-27 DIAGNOSIS — M1611 Unilateral primary osteoarthritis, right hip: Secondary | ICD-10-CM | POA: Diagnosis not present

## 2016-04-27 DIAGNOSIS — Z8249 Family history of ischemic heart disease and other diseases of the circulatory system: Secondary | ICD-10-CM | POA: Diagnosis not present

## 2016-04-27 DIAGNOSIS — I10 Essential (primary) hypertension: Secondary | ICD-10-CM | POA: Diagnosis not present

## 2016-04-27 DIAGNOSIS — M169 Osteoarthritis of hip, unspecified: Secondary | ICD-10-CM | POA: Diagnosis present

## 2016-04-27 DIAGNOSIS — G4700 Insomnia, unspecified: Secondary | ICD-10-CM | POA: Diagnosis not present

## 2016-04-27 DIAGNOSIS — I7300 Raynaud's syndrome without gangrene: Secondary | ICD-10-CM | POA: Diagnosis present

## 2016-04-27 DIAGNOSIS — Z96641 Presence of right artificial hip joint: Secondary | ICD-10-CM | POA: Diagnosis not present

## 2016-04-27 DIAGNOSIS — Z96649 Presence of unspecified artificial hip joint: Secondary | ICD-10-CM

## 2016-04-27 DIAGNOSIS — I4891 Unspecified atrial fibrillation: Secondary | ICD-10-CM | POA: Diagnosis not present

## 2016-04-27 DIAGNOSIS — Z880 Allergy status to penicillin: Secondary | ICD-10-CM | POA: Diagnosis not present

## 2016-04-27 DIAGNOSIS — Z7901 Long term (current) use of anticoagulants: Secondary | ICD-10-CM

## 2016-04-27 DIAGNOSIS — M179 Osteoarthritis of knee, unspecified: Secondary | ICD-10-CM | POA: Insufficient documentation

## 2016-04-27 HISTORY — PX: TOTAL HIP ARTHROPLASTY: SHX124

## 2016-04-27 LAB — TYPE AND SCREEN
ABO/RH(D): A POS
ANTIBODY SCREEN: NEGATIVE

## 2016-04-27 LAB — PROTIME-INR
INR: 0.94
PROTHROMBIN TIME: 12.6 s (ref 11.4–15.2)

## 2016-04-27 SURGERY — ARTHROPLASTY, HIP, TOTAL, ANTERIOR APPROACH
Anesthesia: Spinal | Site: Hip | Laterality: Right

## 2016-04-27 MED ORDER — TRAMADOL HCL 50 MG PO TABS: 50.0000 mg | ORAL_TABLET | Freq: Four times a day (QID) | ORAL | Status: DC | PRN

## 2016-04-27 MED ORDER — SODIUM CHLORIDE 0.9 % IV SOLN
1000.0000 mg | INTRAVENOUS | Status: AC
  Administered 2016-04-27: 1000 mg via INTRAVENOUS
  Filled 2016-04-27: qty 1100

## 2016-04-27 MED ORDER — MENTHOL 3 MG MT LOZG: 1.0000 | LOZENGE | OROMUCOSAL | Status: DC | PRN

## 2016-04-27 MED ORDER — BUPIVACAINE HCL (PF) 0.5 % IJ SOLN
INTRAMUSCULAR | Status: AC
  Filled 2016-04-27: qty 30

## 2016-04-27 MED ORDER — VANCOMYCIN HCL IN DEXTROSE 1-5 GM/200ML-% IV SOLN
1000.0000 mg | Freq: Two times a day (BID) | INTRAVENOUS | Status: AC
  Administered 2016-04-27: 1000 mg via INTRAVENOUS
  Filled 2016-04-27: qty 200

## 2016-04-27 MED ORDER — POLYETHYLENE GLYCOL 3350 17 G PO PACK: 17.0000 g | PACK | Freq: Every day | ORAL | Status: DC | PRN

## 2016-04-27 MED ORDER — ESMOLOL HCL 100 MG/10ML IV SOLN
INTRAVENOUS | Status: DC | PRN
  Administered 2016-04-27 (×2): 20 mg via INTRAVENOUS

## 2016-04-27 MED ORDER — DEXAMETHASONE SODIUM PHOSPHATE 10 MG/ML IJ SOLN
10.0000 mg | Freq: Once | INTRAMUSCULAR | Status: AC
  Administered 2016-04-27: 10 mg via INTRAVENOUS

## 2016-04-27 MED ORDER — BUPIVACAINE HCL (PF) 0.25 % IJ SOLN
INTRAMUSCULAR | Status: AC
  Filled 2016-04-27: qty 30

## 2016-04-27 MED ORDER — ACETAMINOPHEN 10 MG/ML IV SOLN
1000.0000 mg | Freq: Once | INTRAVENOUS | Status: AC
  Administered 2016-04-27: 1000 mg via INTRAVENOUS

## 2016-04-27 MED ORDER — CHLORHEXIDINE GLUCONATE 4 % EX LIQD: 60.0000 mL | Freq: Once | CUTANEOUS | Status: DC

## 2016-04-27 MED ORDER — DILTIAZEM HCL ER COATED BEADS 240 MG PO CP24
240.0000 mg | ORAL_CAPSULE | Freq: Every day | ORAL | Status: DC
  Administered 2016-04-28: 240 mg via ORAL
  Filled 2016-04-27: qty 1

## 2016-04-27 MED ORDER — ONDANSETRON HCL 4 MG/2ML IJ SOLN
4.0000 mg | Freq: Four times a day (QID) | INTRAMUSCULAR | Status: DC | PRN
  Administered 2016-04-28: 4 mg via INTRAVENOUS
  Filled 2016-04-27: qty 2

## 2016-04-27 MED ORDER — MIDAZOLAM HCL 2 MG/2ML IJ SOLN
INTRAMUSCULAR | Status: AC
  Filled 2016-04-27: qty 2

## 2016-04-27 MED ORDER — DOCUSATE SODIUM 100 MG PO CAPS
100.0000 mg | ORAL_CAPSULE | Freq: Two times a day (BID) | ORAL | Status: DC
  Administered 2016-04-27 – 2016-04-28 (×2): 100 mg via ORAL
  Filled 2016-04-27 (×2): qty 1

## 2016-04-27 MED ORDER — BISACODYL 10 MG RE SUPP: 10.0000 mg | Freq: Every day | RECTAL | Status: DC | PRN

## 2016-04-27 MED ORDER — OXYCODONE HCL 5 MG PO TABS
5.0000 mg | ORAL_TABLET | ORAL | Status: DC | PRN
  Administered 2016-04-27: 10 mg via ORAL
  Administered 2016-04-27: 5 mg via ORAL
  Administered 2016-04-28: 10 mg via ORAL
  Administered 2016-04-28: 5 mg via ORAL
  Filled 2016-04-27 (×2): qty 1
  Filled 2016-04-27: qty 2
  Filled 2016-04-27 (×2): qty 1

## 2016-04-27 MED ORDER — ATORVASTATIN CALCIUM 20 MG PO TABS
40.0000 mg | ORAL_TABLET | Freq: Every day | ORAL | Status: DC
  Administered 2016-04-27: 40 mg via ORAL
  Filled 2016-04-27: qty 2

## 2016-04-27 MED ORDER — PROPOFOL 10 MG/ML IV BOLUS
INTRAVENOUS | Status: AC
  Filled 2016-04-27: qty 20

## 2016-04-27 MED ORDER — HYDROMORPHONE HCL 1 MG/ML IJ SOLN: 0.2500 mg | INTRAMUSCULAR | Status: DC | PRN

## 2016-04-27 MED ORDER — BUPIVACAINE HCL (PF) 0.5 % IJ SOLN
INTRAMUSCULAR | Status: DC | PRN
  Administered 2016-04-27: 15 mg via INTRATHECAL

## 2016-04-27 MED ORDER — MIDAZOLAM HCL 5 MG/5ML IJ SOLN
INTRAMUSCULAR | Status: DC | PRN
  Administered 2016-04-27: 2 mg via INTRAVENOUS

## 2016-04-27 MED ORDER — PHENYLEPHRINE 40 MCG/ML (10ML) SYRINGE FOR IV PUSH (FOR BLOOD PRESSURE SUPPORT)
PREFILLED_SYRINGE | INTRAVENOUS | Status: AC
  Filled 2016-04-27: qty 10

## 2016-04-27 MED ORDER — METHOCARBAMOL 1000 MG/10ML IJ SOLN
500.0000 mg | Freq: Four times a day (QID) | INTRAVENOUS | Status: DC | PRN
  Filled 2016-04-27: qty 5

## 2016-04-27 MED ORDER — ONDANSETRON HCL 4 MG/2ML IJ SOLN
INTRAMUSCULAR | Status: AC
  Filled 2016-04-27: qty 2

## 2016-04-27 MED ORDER — ACETAMINOPHEN 500 MG PO TABS
1000.0000 mg | ORAL_TABLET | Freq: Four times a day (QID) | ORAL | Status: AC
  Administered 2016-04-27 – 2016-04-28 (×4): 1000 mg via ORAL
  Filled 2016-04-27 (×4): qty 2

## 2016-04-27 MED ORDER — MORPHINE SULFATE (PF) 4 MG/ML IV SOLN: 1.0000 mg | INTRAVENOUS | Status: DC | PRN

## 2016-04-27 MED ORDER — PROPOFOL 10 MG/ML IV BOLUS
INTRAVENOUS | Status: AC
  Filled 2016-04-27: qty 40

## 2016-04-27 MED ORDER — 0.9 % SODIUM CHLORIDE (POUR BTL) OPTIME
TOPICAL | Status: DC | PRN
  Administered 2016-04-27: 1000 mL

## 2016-04-27 MED ORDER — PHENYLEPHRINE HCL 10 MG/ML IJ SOLN
INTRAMUSCULAR | Status: DC | PRN
  Administered 2016-04-27 (×6): 80 ug via INTRAVENOUS

## 2016-04-27 MED ORDER — ONDANSETRON HCL 4 MG PO TABS: 4.0000 mg | ORAL_TABLET | Freq: Four times a day (QID) | ORAL | Status: DC | PRN

## 2016-04-27 MED ORDER — METOCLOPRAMIDE HCL 5 MG PO TABS: 5.0000 mg | ORAL_TABLET | Freq: Three times a day (TID) | ORAL | Status: DC | PRN

## 2016-04-27 MED ORDER — LACTATED RINGERS IV SOLN
INTRAVENOUS | Status: DC
  Administered 2016-04-27 (×2): via INTRAVENOUS

## 2016-04-27 MED ORDER — SODIUM CHLORIDE 0.9 % IV SOLN
INTRAVENOUS | Status: DC
  Administered 2016-04-27: 1000 mL via INTRAVENOUS

## 2016-04-27 MED ORDER — ACETAMINOPHEN 325 MG PO TABS
650.0000 mg | ORAL_TABLET | Freq: Four times a day (QID) | ORAL | Status: DC | PRN
  Filled 2016-04-27: qty 2

## 2016-04-27 MED ORDER — PROPOFOL 500 MG/50ML IV EMUL
INTRAVENOUS | Status: DC | PRN
  Administered 2016-04-27: 100 ug/kg/min via INTRAVENOUS

## 2016-04-27 MED ORDER — DEXAMETHASONE SODIUM PHOSPHATE 10 MG/ML IJ SOLN
INTRAMUSCULAR | Status: AC
  Filled 2016-04-27: qty 1

## 2016-04-27 MED ORDER — HYDROCHLOROTHIAZIDE 25 MG PO TABS
25.0000 mg | ORAL_TABLET | Freq: Every day | ORAL | Status: DC
  Administered 2016-04-28: 25 mg via ORAL
  Filled 2016-04-27: qty 1

## 2016-04-27 MED ORDER — PANTOPRAZOLE SODIUM 40 MG PO TBEC
40.0000 mg | DELAYED_RELEASE_TABLET | Freq: Every day | ORAL | Status: DC
  Administered 2016-04-28: 40 mg via ORAL
  Filled 2016-04-27: qty 1

## 2016-04-27 MED ORDER — ZOLPIDEM TARTRATE 5 MG PO TABS
5.0000 mg | ORAL_TABLET | Freq: Every evening | ORAL | Status: DC | PRN
  Administered 2016-04-27: 5 mg via ORAL
  Filled 2016-04-27: qty 1

## 2016-04-27 MED ORDER — PHENOL 1.4 % MT LIQD: 1.0000 | OROMUCOSAL | Status: DC | PRN

## 2016-04-27 MED ORDER — BUPIVACAINE HCL (PF) 0.25 % IJ SOLN
INTRAMUSCULAR | Status: DC | PRN
  Administered 2016-04-27: 30 mL

## 2016-04-27 MED ORDER — ACETAMINOPHEN 650 MG RE SUPP: 650.0000 mg | Freq: Four times a day (QID) | RECTAL | Status: DC | PRN

## 2016-04-27 MED ORDER — VANCOMYCIN HCL IN DEXTROSE 1-5 GM/200ML-% IV SOLN
1000.0000 mg | INTRAVENOUS | Status: AC
  Administered 2016-04-27: 1000 mg via INTRAVENOUS
  Filled 2016-04-27: qty 200

## 2016-04-27 MED ORDER — APIXABAN 2.5 MG PO TABS
2.5000 mg | ORAL_TABLET | Freq: Two times a day (BID) | ORAL | Status: DC
  Administered 2016-04-28: 2.5 mg via ORAL
  Filled 2016-04-27: qty 1

## 2016-04-27 MED ORDER — ONDANSETRON HCL 4 MG/2ML IJ SOLN
INTRAMUSCULAR | Status: DC | PRN
  Administered 2016-04-27: 4 mg via INTRAVENOUS

## 2016-04-27 MED ORDER — DEXAMETHASONE SODIUM PHOSPHATE 10 MG/ML IJ SOLN
10.0000 mg | Freq: Once | INTRAMUSCULAR | Status: AC
  Administered 2016-04-28: 10 mg via INTRAVENOUS
  Filled 2016-04-27: qty 1

## 2016-04-27 MED ORDER — FLEET ENEMA 7-19 GM/118ML RE ENEM: 1.0000 | ENEMA | Freq: Once | RECTAL | Status: DC | PRN

## 2016-04-27 MED ORDER — METOCLOPRAMIDE HCL 5 MG/ML IJ SOLN: 5.0000 mg | Freq: Three times a day (TID) | INTRAMUSCULAR | Status: DC | PRN

## 2016-04-27 MED ORDER — DIPHENHYDRAMINE HCL 12.5 MG/5ML PO ELIX: 12.5000 mg | ORAL_SOLUTION | ORAL | Status: DC | PRN

## 2016-04-27 MED ORDER — METHOCARBAMOL 500 MG PO TABS
500.0000 mg | ORAL_TABLET | Freq: Four times a day (QID) | ORAL | Status: DC | PRN
  Administered 2016-04-27 – 2016-04-28 (×2): 500 mg via ORAL
  Filled 2016-04-27 (×2): qty 1

## 2016-04-27 MED ORDER — ACETAMINOPHEN 10 MG/ML IV SOLN
INTRAVENOUS | Status: AC
  Filled 2016-04-27: qty 100

## 2016-04-27 SURGICAL SUPPLY — 33 items
BAG DECANTER FOR FLEXI CONT (MISCELLANEOUS) IMPLANT
BAG ZIPLOCK 12X15 (MISCELLANEOUS) IMPLANT
BLADE SAG 18X100X1.27 (BLADE) ×2 IMPLANT
CAPT HIP TOTAL 2 ×2 IMPLANT
CLOTH BEACON ORANGE TIMEOUT ST (SAFETY) ×2 IMPLANT
COVER PERINEAL POST (MISCELLANEOUS) ×2 IMPLANT
DECANTER SPIKE VIAL GLASS SM (MISCELLANEOUS) ×2 IMPLANT
DRAPE STERI IOBAN 125X83 (DRAPES) ×2 IMPLANT
DRAPE U-SHAPE 47X51 STRL (DRAPES) ×4 IMPLANT
DRSG ADAPTIC 3X8 NADH LF (GAUZE/BANDAGES/DRESSINGS) ×2 IMPLANT
DRSG MEPILEX BORDER 4X4 (GAUZE/BANDAGES/DRESSINGS) ×2 IMPLANT
DRSG MEPILEX BORDER 4X8 (GAUZE/BANDAGES/DRESSINGS) ×2 IMPLANT
DURAPREP 26ML APPLICATOR (WOUND CARE) ×2 IMPLANT
ELECT REM PT RETURN 9FT ADLT (ELECTROSURGICAL) ×2
ELECTRODE REM PT RTRN 9FT ADLT (ELECTROSURGICAL) ×1 IMPLANT
EVACUATOR 1/8 PVC DRAIN (DRAIN) ×2 IMPLANT
GLOVE BIO SURGEON STRL SZ7.5 (GLOVE) ×2 IMPLANT
GLOVE BIO SURGEON STRL SZ8 (GLOVE) ×2 IMPLANT
GLOVE BIOGEL PI IND STRL 8 (GLOVE) ×2 IMPLANT
GLOVE BIOGEL PI INDICATOR 8 (GLOVE) ×2
GOWN STRL REUS W/TWL LRG LVL3 (GOWN DISPOSABLE) ×2 IMPLANT
GOWN STRL REUS W/TWL XL LVL3 (GOWN DISPOSABLE) ×2 IMPLANT
PACK ANTERIOR HIP CUSTOM (KITS) ×2 IMPLANT
STRIP CLOSURE SKIN 1/2X4 (GAUZE/BANDAGES/DRESSINGS) ×4 IMPLANT
SUT ETHIBOND NAB CT1 #1 30IN (SUTURE) ×2 IMPLANT
SUT MNCRL AB 4-0 PS2 18 (SUTURE) ×2 IMPLANT
SUT VIC AB 2-0 CT1 27 (SUTURE) ×2
SUT VIC AB 2-0 CT1 TAPERPNT 27 (SUTURE) ×2 IMPLANT
SUT VLOC 180 0 24IN GS25 (SUTURE) ×2 IMPLANT
SYR 50ML LL SCALE MARK (SYRINGE) IMPLANT
TRAY FOLEY CATH 14FRSI W/METER (CATHETERS) ×2 IMPLANT
TRAY FOLEY W/METER SILVER 16FR (SET/KITS/TRAYS/PACK) IMPLANT
YANKAUER SUCT BULB TIP 10FT TU (MISCELLANEOUS) ×2 IMPLANT

## 2016-04-27 NOTE — H&P (View-Only) (Signed)
GWENDA KISIEL DOB: 1945-03-15 Married / Language: English / Race: White Female Date of Admission:  04/27/2016  CC:  Right Hip Pain History of Present Illness The patient is a 71 year old female who comes in for a preoperative History and Physical. The patient is scheduled for a right total hip arthroplasty (anterior) to be performed by Dr. Dione Plover. Aluisio, MD at [facility] on 04-27-2016. The patient is a 71 year old female who presented for follow up of their hip. The patient is being followed for their right hip pain and osteoarthritis. They are month(s) out from IA injection. Symptoms reported include: pain and catching. The patient feels that they are doing poorly and report their pain level to be moderate (on and off). The following medication has been used for pain control: antiinflammatory medication. The patient has not gotten much relief of their symptoms with Cortisone injections. She had relief for about 1 week. She is still able to do most of her regular activities. She is still doing yoga. She has to cut down on how much she walks but she does still walk. Occasionally takes Aleve. She has good days and bad days. She is having pain in the right hip intermittently, but it has not changed much in the past six months or so. She said there were days where it could be rather difficult than other days where it does not bother her much at all. She has advanced arthritis int he hip. She tried to put off the surgery as long as she could. She is now at a stage where she wants to get this fixed. They have been treated conservatively in the past for the above stated problem and despite conservative measures, they continue to have progressive pain and severe functional limitations and dysfunction. They have failed non-operative management including home exercise, medications, and injections. It is felt that they would benefit from undergoing total joint replacement. Risks and benefits of the  procedure have been discussed with the patient and they elect to proceed with surgery. There are no active contraindications to surgery such as ongoing infection or rapidly progressive neurological disease.  Problem List/Past Medical Left knee pain (M25.562)  Chondrocalcinosis of left knee [id number])  Primary osteoarthritis of right hip (M16.11)  Allergic Urticaria  Gastroesophageal Reflux Disease  High blood pressure  Skin Cancer  Atrial Fibrillation  Allergies Penicillin V *PENICILLINS*  Hives. TraMADol HCl *ANALGESICS - OPIOID*  made her sick OxyCODONE HCl *ANALGESICS - OPIOID*  makes her sick  Family History  Heart Disease  father Hypertension  mother, father and brother  Social History Alcohol use  current drinker; drinks wine; 8-14 per week Children  2 Current work status  working part time Drug/Alcohol Rehab (Currently)  no Drug/Alcohol Rehab (Previously)  no Exercise  Exercises daily; does running / walking and other Illicit drug use  no Living situation  live with spouse Marital status  married Number of flights of stairs before winded  2-3 Pain Contract  no Tobacco / smoke exposure  no Tobacco use  former smoker; smoke(d) less than 1/2 pack(s) per day  Medication History  Docusate Sodium (100MG  Capsule, Oral) Active. (Colace) Zolpidem Tartrate (10MG  Tablet, Oral) Active. Pantoprazole Sodium (40MG  Tablet DR, Oral) Active. Hydrochlorothiazide (25MG  Tablet, Oral) Active. Calcium Carbonae Active. Enalapril Maleate (20MG  Tablet, Oral) Active. Eliquis (5MG  Tablet, Oral) Active. Atorvastatin Calcium (40MG  Tablet, Oral) Active. DilTIAZem CD (240MG  Capsule ER 24HR, Oral) Active.  Past Surgical History Mammoplasty; Reduction  bilateral  Review of Systems General Present- Weight Loss (attending [employer]). Not Present- Chills, Fatigue, Fever, Memory Loss, Night Sweats and Weight Gain. Skin Not Present- Eczema, Hives,  Itching, Lesions and Rash. HEENT Not Present- Dentures, Double Vision, Headache, Hearing Loss, Tinnitus and Visual Loss. Respiratory Not Present- Allergies, Chronic Cough, Coughing up blood, Shortness of breath at rest and Shortness of breath with exertion. Cardiovascular Not Present- Chest Pain, Difficulty Breathing Lying Down, Murmur, Palpitations, Racing/skipping heartbeats and Swelling. Gastrointestinal Not Present- Abdominal Pain, Bloody Stool, Constipation, Diarrhea, Difficulty Swallowing, Heartburn, Jaundice, Loss of appetitie, Nausea and Vomiting. Female Genitourinary Not Present- Blood in Urine, Discharge, Flank Pain, Incontinence, Painful Urination, Urgency, Urinary frequency, Urinary Retention, Urinating at Night and Weak urinary stream. Musculoskeletal Present- Joint Pain. Not Present- Back Pain, Joint Swelling, Morning Stiffness, Muscle Pain, Muscle Weakness and Spasms. Neurological Not Present- Blackout spells, Difficulty with balance, Dizziness, Paralysis, Tremor and Weakness. Psychiatric Not Present- Insomnia.  Vitals Weight: 170 lb Height: 65in Weight was reported by patient. Height was reported by patient. Body Surface Area: 1.85 m Body Mass Index: 28.29 kg/m  Pulse: 88 (Irregular)  BP: 128/78 (Sitting, Right Arm, Standard)  Physical Exam  General Mental Status -Alert, cooperative and good historian. General Appearance-pleasant, Not in acute distress. Orientation-Oriented X3. Build & Nutrition-Well nourished and Well developed.  Head and Neck Head-normocephalic, atraumatic . Neck Global Assessment - supple, no bruit auscultated on the right, no bruit auscultated on the left.  Eye Pupil - Bilateral-Regular and Round. Motion - Bilateral-EOMI.  Chest and Lung Exam Auscultation Breath sounds - clear at anterior chest wall and clear at posterior chest wall. Adventitious sounds - No Adventitious sounds.  Cardiovascular Auscultation Rhythm -  Irregularly irregular(known Atrial Fib.). Heart Sounds - S1 WNL and S2 WNL. Murmurs & Other Heart Sounds - Auscultation of the heart reveals - No Murmurs.  Abdomen Palpation/Percussion Tenderness - Abdomen is non-tender to palpation. Rigidity (guarding) - Abdomen is soft. Auscultation Auscultation of the abdomen reveals - Bowel sounds normal.  Female Genitourinary Note: Not done, not pertinent to present illness   Musculoskeletal Note: On exam, she is alert and oriented, in no apparent distress. Evaluation of her left hip, normal motion with no discomfort. Right hip shows flexion to about 100, rotation in 10, out 30, abduction 30 without discomfort.  RADIOGRAPHS AP pelvis and lateral of the right hip show advanced end-stage arthritis of the hip, but is unchanged compared to six months ago  Assessment & Plan  Primary osteoarthritis of right hip (M16.11)  Note:Surgical Plans: Right Total Hip Replacement - Anterior Approach  Disposition: Home  PCP: Dr. Wolfgang Phoenix - Patient has been seen preoperatively and felt to be stable for surgery. "Continue diltiazem. Hold Eliquis. Restart ASAP post surgery."  IV TXA  Anesthesia Issues: None  Patient was instructed on what medications to stop prior to surgery.  Signed electronically by Ok Edwards, III PA-C

## 2016-04-27 NOTE — Interval H&P Note (Signed)
History and Physical Interval Note:  04/27/2016 8:07 AM  Sara Terry  has presented today for surgery, with the diagnosis of RGHT HIP OA  The various methods of treatment have been discussed with the patient and family. After consideration of risks, benefits and other options for treatment, the patient has consented to  Procedure(s): RIGHT TOTAL HIP ARTHROPLASTY ANTERIOR APPROACH (Right) as a surgical intervention .  The patient's history has been reviewed, patient examined, no change in status, stable for surgery.  I have reviewed the patient's chart and labs.  Questions were answered to the patient's satisfaction.     Gearlean Alf

## 2016-04-27 NOTE — Transfer of Care (Signed)
Immediate Anesthesia Transfer of Care Note  Patient: Sara Terry  Procedure(s) Performed: Procedure(s): RIGHT TOTAL HIP ARTHROPLASTY ANTERIOR APPROACH (Right)  Patient Location: PACU  Anesthesia Type:Spinal  Level of Consciousness: sedated  Airway & Oxygen Therapy: Patient Spontanous Breathing and Patient connected to face mask oxygen  Post-op Assessment: Report given to RN and Post -op Vital signs reviewed and stable  Post vital signs: Reviewed and stable  Last Vitals:  Vitals:   04/27/16 0610 04/27/16 0632  BP: (!) 158/100 128/84  Pulse: 80   Resp: 18   Temp: 36.5 C     Last Pain:  Vitals:   04/27/16 0638  TempSrc:   PainSc: 7       Patients Stated Pain Goal: 5 ([id number])  Complications: No apparent anesthesia complications

## 2016-04-27 NOTE — Anesthesia Procedure Notes (Signed)
Spinal  Patient location during procedure: OR Start time: 04/27/2016 8:26 AM End time: 04/27/2016 8:28 AM Staffing Resident/CRNA: Harle Stanford R Performed: resident/CRNA  Preanesthetic Checklist Completed: patient identified, site marked, surgical consent, pre-op evaluation, timeout performed, IV checked, risks and benefits discussed and monitors and equipment checked Spinal Block Patient position: sitting Prep: Betadine Patient monitoring: heart rate, cardiac monitor, continuous pulse ox and blood pressure Approach: midline Location: L3-4 Injection technique: single-shot Needle Needle type: Pencan  Needle gauge: 24 G Needle length: 10 cm Needle insertion depth: 7 cm Assessment Sensory level: T6 Additional Notes Timeout performed. Lab WNL SAB kite date checked. SAB without difficulty

## 2016-04-27 NOTE — Op Note (Signed)
OPERATIVE REPORT- TOTAL HIP ARTHROPLASTY   PREOPERATIVE DIAGNOSIS: Osteoarthritis of the Right hip.   POSTOPERATIVE DIAGNOSIS: Osteoarthritis of the Right  hip.   PROCEDURE: Right total hip arthroplasty, anterior approach.   SURGEON: Gaynelle Arabian, MD   ASSISTANT: Arlee Muslim, PA-C  ANESTHESIA:  Spinal  ESTIMATED BLOOD LOSS:-200 ml   DRAINS: Hemovac x1.   COMPLICATIONS: None   CONDITION: PACU - hemodynamically stable.   BRIEF CLINICAL NOTE: Sara Terry is a 71 y.o. female who has advanced end-  stage arthritis of their Right  hip with progressively worsening pain and  dysfunction.The patient has failed nonoperative management and presents for  total hip arthroplasty.   PROCEDURE IN DETAIL: After successful administration of spinal  anesthetic, the traction boots for the [facility] bed were placed on both  feet and the patient was placed onto the [facility] bed, boots placed into the leg  holders. The Right hip was then isolated from the perineum with plastic  drapes and prepped and draped in the usual sterile fashion. ASIS and  greater trochanter were marked and a oblique incision was made, starting  at about 1 cm lateral and 2 cm distal to the ASIS and coursing towards  the anterior cortex of the femur. The skin was cut with a 10 blade  through subcutaneous tissue to the level of the fascia overlying the  tensor fascia lata muscle. The fascia was then incised in line with the  incision at the junction of the anterior third and posterior 2/3rd. The  muscle was teased off the fascia and then the interval between the TFL  and the rectus was developed. The Hohmann retractor was then placed at  the top of the femoral neck over the capsule. The vessels overlying the  capsule were cauterized and the fat on top of the capsule was removed.  A Hohmann retractor was then placed anterior underneath the rectus  femoris to give exposure to the entire anterior capsule. A T-shaped   capsulotomy was performed. The edges were tagged and the femoral head  was identified.       Osteophytes are removed off the superior acetabulum.  The femoral neck was then cut in situ with an oscillating saw. Traction  was then applied to the left lower extremity utilizing the [facility]  traction. The femoral head was then removed. Retractors were placed  around the acetabulum and then circumferential removal of the labrum was  performed. Osteophytes were also removed. Reaming starts at 45 mm to  medialize and  Increased in 2 mm increments to 49 mm. We reamed in  approximately 40 degrees of abduction, 20 degrees anteversion. A 50 mm  pinnacle acetabular shell was then impacted in anatomic position under  fluoroscopic guidance with excellent purchase. We did not need to place  any additional dome screws. A 32 mm neutral + 4 marathon liner was then  placed into the acetabular shell.       The femoral lift was then placed along the lateral aspect of the femur  just distal to the vastus ridge. The leg was  externally rotated and capsule  was stripped off the inferior aspect of the femoral neck down to the  level of the lesser trochanter, this was done with electrocautery. The femur was lifted after this was performed. The  leg was then placed in an extended and adducted position essentially delivering the femur. We also removed the capsule superiorly and the piriformis from the piriformis fossa  to gain excellent exposure of the  proximal femur. Rongeur was used to remove some cancellous bone to get  into the lateral portion of the proximal femur for placement of the  initial starter reamer. The starter broaches was placed  the starter broach  and was shown to go down the center of the canal. Broaching  with the  Corail system was then performed starting at size 8, coursing  Up to size 11. A size 11 had excellent torsional and rotational  and axial stability. The trial high offset neck was then  placed  with a 32 + 1 trial head. The hip was then reduced. We confirmed that  the stem was in the canal both on AP and lateral x-rays. It also has excellent sizing. The hip was reduced with outstanding stability through full extension and full external rotation.. AP pelvis was taken and the leg lengths were measured and found to be equal. Hip was then dislocated again and the femoral head and neck removed. The  femoral broach was removed. Size 11 Corail stem with a high offset  neck was then impacted into the femur following native anteversion. Has  excellent purchase in the canal. Excellent torsional and rotational and  axial stability. It is confirmed to be in the canal on AP and lateral  fluoroscopic views. The 32 + 1 ceramic head was placed and the hip  reduced with outstanding stability. Again AP pelvis was taken and it  confirmed that the leg lengths were equal. The wound was then copiously  irrigated with saline solution and the capsule reattached and repaired  with Ethibond suture. 30 ml of .25% Bupivicaine was  injected into the capsule and into the edge of the tensor fascia lata as well as subcutaneous tissue. The fascia overlying the tensor fascia lata was then closed with a running #1 V-Loc. Subcu was closed with interrupted 2-0 Vicryl and subcuticular running 4-0 Monocryl. Incision was cleaned  and dried. Steri-Strips and a bulky sterile dressing applied. Hemovac  drain was hooked to suction and then the patient was awakened and transported to  recovery in stable condition.        Please note that a surgical assistant was a medical necessity for this procedure to perform it in a safe and expeditious manner. Assistant was necessary to provide appropriate retraction of vital neurovascular structures and to prevent femoral fracture and allow for anatomic placement of the prosthesis.  Gaynelle Arabian, M.D.

## 2016-04-27 NOTE — Anesthesia Postprocedure Evaluation (Signed)
Anesthesia Post Note  Patient: Sara Terry  Procedure(s) Performed: Procedure(s) (LRB): RIGHT TOTAL HIP ARTHROPLASTY ANTERIOR APPROACH (Right)  Patient location during evaluation: PACU Anesthesia Type: Spinal Level of consciousness: awake and alert Pain management: pain level controlled Vital Signs Assessment: post-procedure vital signs reviewed and stable Respiratory status: spontaneous breathing and respiratory function stable Cardiovascular status: blood pressure returned to baseline and stable Postop Assessment: spinal receding Anesthetic complications: no       Last Vitals:  Vitals:   04/27/16 1215 04/27/16 1325  BP: (!) 141/90 129/76  Pulse: 90 93  Resp: 16 15  Temp: 36.5 C 36.5 C    Last Pain:  Vitals:   04/27/16 1325  TempSrc: Oral  PainSc:                  [name]

## 2016-04-27 NOTE — Progress Notes (Signed)
PT Cancellation Note  Patient Details Name: NATALE MELGAREJO MRN: [id number] DOB: Apr 18, 1945   Cancelled Treatment:    Reason Eval/Treat Not Completed: Medical issues which prohibited therapy (spinal not yet worn off, B feet tingly, cannot PF/DF ankles actively. Will follow. )   Philomena Doheny 04/27/2016, 2:00 PM [phone]

## 2016-04-28 ENCOUNTER — Encounter (HOSPITAL_COMMUNITY): Payer: Self-pay | Admitting: Orthopedic Surgery

## 2016-04-28 LAB — BASIC METABOLIC PANEL
ANION GAP: 6 (ref 5–15)
BUN: 13 mg/dL (ref 6–20)
CHLORIDE: 102 mmol/L (ref 101–111)
CO2: 25 mmol/L (ref 22–32)
Calcium: 8.8 mg/dL — ABNORMAL LOW (ref 8.9–10.3)
Creatinine, Ser: 0.39 mg/dL — ABNORMAL LOW (ref 0.44–1.00)
GFR calc non Af Amer: >60 mL/min (ref >60)
Glucose, Bld: 157 mg/dL — ABNORMAL HIGH (ref 65–99)
Potassium: 4.1 mmol/L (ref 3.5–5.1)
SODIUM: 133 mmol/L — AB (ref 135–145)

## 2016-04-28 LAB — CBC
HEMATOCRIT: 30.5 % — AB (ref 36.0–46.0)
HEMOGLOBIN: 11 g/dL — AB (ref 12.0–15.0)
MCH: 34 pg (ref 26.0–34.0)
MCHC: 36.1 g/dL — AB (ref 30.0–36.0)
MCV: 94.1 fL (ref 78.0–100.0)
Platelets: 264 10*3/uL (ref 150–400)
RBC: 3.24 MIL/uL — AB (ref 3.87–5.11)
RDW: 12.2 % (ref 11.5–15.5)
WBC: 11 10*3/uL — ABNORMAL HIGH (ref 4.0–10.5)

## 2016-04-28 MED ORDER — OXYCODONE HCL 5 MG PO TABS: 5.0000 mg | ORAL_TABLET | ORAL | 0 refills | Status: DC | PRN

## 2016-04-28 MED ORDER — METHOCARBAMOL 500 MG PO TABS: 500.0000 mg | ORAL_TABLET | Freq: Four times a day (QID) | ORAL | 0 refills | Status: DC | PRN

## 2016-04-28 MED ORDER — TRAMADOL HCL 50 MG PO TABS: 50.0000 mg | ORAL_TABLET | Freq: Four times a day (QID) | ORAL | 0 refills | Status: DC | PRN

## 2016-04-28 NOTE — Progress Notes (Signed)
Physical Therapy Treatment Patient Details Name: Sara Terry MRN: [id number] DOB: 12/26/1945 Today's Date: 04/28/2016    History of Present Illness s/p R THA    PT Comments    Pt progressing well with mobility.  Spouse present and participating in session.  Reviewed stairs, home therex, and car transfers.   Follow Up Recommendations  Home health PT     Equipment Recommendations  None recommended by PT    Recommendations for Other Services OT consult     Precautions / Restrictions Precautions Precautions: Fall Restrictions Weight Bearing Restrictions: No Other Position/Activity Restrictions: WBAT    Mobility  Bed Mobility Overal bed mobility: Needs Assistance Bed Mobility: Supine to Sit     Supine to sit: Min assist     General bed mobility comments: Pt declines back to bed, states she will sleep in recliner initially  Transfers Overall transfer level: Needs assistance Equipment used: Rolling walker (2 wheeled) Transfers: Sit to/from Stand Sit to Stand: Min guard;Supervision         General transfer comment: cues for LE management and use of UEs to self assist  Ambulation/Gait Ambulation/Gait assistance: Min guard;Supervision Ambulation Distance (Feet): 80 Feet (and 40') Assistive device: Rolling walker (2 wheeled) Gait Pattern/deviations: Step-to pattern;Decreased step length - right;Decreased step length - left;Shuffle;Trunk flexed Gait velocity: decr Gait velocity interpretation: Below normal speed for age/gender General Gait Details: cues for sequence, posture and position from RW.     Stairs Stairs: Yes   Stair Management: No rails;Backwards;With walker;Step to pattern Number of Stairs: 4 General stair comments: 2 steps twice with spouse assisting on second attempt  Wheelchair Mobility    Modified Rankin (Stroke Patients Only)       Balance Overall balance assessment: No apparent balance deficits (not formally assessed)                                  Cognition Arousal/Alertness: Awake/alert Behavior During Therapy: WFL for tasks assessed/performed Overall Cognitive Status: Within Functional Limits for tasks assessed                      Exercises Total Joint Exercises Ankle Circles/Pumps: AROM;Both;20 reps;Supine Quad Sets: AROM;Both;10 reps;Supine Heel Slides: AAROM;Right;20 reps;Supine Hip ABduction/ADduction: AAROM;Right;15 reps;Supine    General Comments        Pertinent Vitals/Pain Pain Assessment: 0-10 Pain Score: 4  Pain Location: R hip Pain Descriptors / Indicators: Aching Pain Intervention(s): Limited activity within patient's tolerance;Monitored during session;Premedicated before session;Ice applied    Home Living Family/patient expects to be discharged to:: Private residence Living Arrangements: Spouse/significant other Available Help at Discharge: Family Type of Home: House Home Access: Stairs to enter Entrance Stairs-Rails: None Home Layout: One level Home Equipment: Environmental consultant - 2 wheels      Prior Function Level of Independence: Independent          PT Goals (current goals can now be found in the care plan section) Acute Rehab PT Goals Patient Stated Goal: feel better, back to independence PT Goal Formulation: With patient Time For Goal Achievement: 05/01/16 Potential to Achieve Goals: Good Progress towards PT goals: Progressing toward goals    Frequency    7X/week      PT Plan Current plan remains appropriate    Co-evaluation             End of Session Equipment Utilized During Treatment: Gait belt Activity Tolerance: Patient tolerated  treatment well Patient left: in chair;with call bell/phone within reach;with family/visitor present Nurse Communication: Mobility status PT Visit Diagnosis: Difficulty in walking, not elsewhere classified (R26.2)     Time: [id number] PT Time Calculation (min) (ACUTE ONLY): 40 min  Charges:  $Gait  Training: 8-22 mins $Therapeutic Exercise: 8-22 mins $Therapeutic Activity: 8-22 mins                    G Codes:       Jermanie Minshall 07-May-2016, 2:05 PM

## 2016-04-28 NOTE — Evaluation (Signed)
Occupational Therapy Evaluation Patient Details Name: Sara Terry MRN: [id number] DOB: 01-09-1946 Today's Date: 04/28/2016    History of Present Illness s/p R THA   Clinical Impression   This 71 year old female was admitted for the above sx.  She will benefit from one more OT session.  Pt was limited by nausea this session. Husband will assist with adls.  Bathroom transfer goals are for supervision to min guard level    Follow Up Recommendations  Supervision/Assistance - 24 hour    Equipment Recommendations  None recommended by OT    Recommendations for Other Services       Precautions / Restrictions Precautions Precautions: Fall Restrictions Weight Bearing Restrictions: No                                                     ADL Overall ADL's : Needs assistance/impaired     Grooming: Wash/dry hands;Wash/dry face;Set up;Sitting   Upper Body Bathing: Set up;Sitting   Lower Body Bathing: Moderate assistance;Sit to/from stand   Upper Body Dressing : Set up;Sitting   Lower Body Dressing: Maximal assistance;Bed level                 General ADL Comments: pt performed adl from recliner, sit to stand with min guard for this.  LB adls limited by nausea.  She has a Secondary school teacher at home.  Husband has had multiple hip sxs     Vision         Perception     Praxis      Pertinent Vitals/Pain Pain Assessment: 0-10 Pain Score: 5  Pain Location: R hip Pain Descriptors / Indicators: Aching Pain Intervention(s): Limited activity within patient's tolerance;Monitored during session;Premedicated before session;Repositioned;Ice applied     Hand Dominance     Extremity/Trunk Assessment Upper Extremity Assessment Upper Extremity Assessment: Overall WFL for tasks assessed           Communication Communication Communication: No difficulties   Cognition Arousal/Alertness: Awake/alert Behavior During Therapy: WFL for tasks  assessed/performed Overall Cognitive Status: Within Functional Limits for tasks assessed                     General Comments       Exercises       Shoulder Instructions      Home Living Family/patient expects to be discharged to:: Private residence Living Arrangements: Spouse/significant other Available Help at Discharge: Family               Bathroom Shower/Tub: Walk-in Corporate treasurer Toilet: Handicapped height     Home Equipment: Bedside commode          Prior Functioning/Environment Level of Independence: Independent                 OT Problem List: Decreased activity tolerance;Pain;Decreased knowledge of use of DME or AE      OT Treatment/Interventions: Self-care/ADL training;DME and/or AE instruction;Patient/family education    OT Goals(Current goals can be found in the care plan section) Acute Rehab OT Goals Patient Stated Goal: feel better, back to independence OT Goal Formulation: With patient Time For Goal Achievement: 05/01/16 Potential to Achieve Goals: Good ADL Goals Pt Will Transfer to Toilet: with supervision;bedside commode;ambulating Pt Will Perform Toileting - Clothing Manipulation and hygiene: with supervision;sit to/from stand  Pt Will Perform Tub/Shower Transfer: Shower transfer;with min guard assist;ambulating;3 in 1  OT Frequency: Min 2X/week   Barriers to D/C:            Co-evaluation              End of Session    Activity Tolerance:  (limited by nausea) Patient left: in chair;with call bell/phone within reach  OT Visit Diagnosis: Pain Pain - Right/Left: Right Pain - part of body: Hip                ADL either performed or assessed with clinical judgement  Time: 0925-0940 OT Time Calculation (min): 15 min Charges:  OT General Charges $OT Visit: 1 Procedure OT Evaluation $OT Eval Low Complexity: 1 Procedure G-Codes:     [place],  OTR/L [id number] 04/28/2016  Sara Terry 04/28/2016, 10:00 AM

## 2016-04-28 NOTE — Progress Notes (Signed)
   04/28/16 1500  OT Visit Information  Last OT Received On 04/28/16  Assistance Needed +1  History of Present Illness s/p R THA  Precautions  Precautions Fall  Pain Assessment  Pain Score 2  Pain Location R hip  Pain Descriptors / Indicators Aching  Pain Intervention(s) Limited activity within patient's tolerance;Monitored during session;Premedicated before session;Repositioned  Cognition  Arousal/Alertness Awake/alert  Behavior During Therapy WFL for tasks assessed/performed  Overall Cognitive Status Within Functional Limits for tasks assessed  ADL  Lower Body Dressing Sit to/from stand;Minimal assistance (pants)  Toilet Transfer Min guard;Ambulation;BSC;RW  Tub/ IT consultant shower;Min guard;Ambulation;3 in 1  General ADL Comments pt plans discharge home today. Performed the above activities.  Pt felt nauseous in the bathroom, but this passed.  Transfers  Overall transfer level Needs assistance  Equipment used Rolling walker (2 wheeled)  Sit to Stand Supervision  General transfer comment cues for UE placement  OT - End of Session  Activity Tolerance Patient tolerated treatment well  Patient left in chair;with call bell/phone within reach;with family/visitor present  OT Assessment/Plan  OT Visit Diagnosis Pain  Pain - Right/Left Right  Pain - part of body Hip  Follow Up Recommendations Supervision/Assistance - 24 hour  OT Equipment None recommended by OT  AM-PAC OT "6 Clicks" Daily Activity Outcome Measure  Help from another person eating meals? 4  Help from another person taking care of personal grooming? 3  Help from another person toileting, which includes using toliet, bedpan, or urinal? 3  Help from another person bathing (including washing, rinsing, drying)? 2  Help from another person to put on and taking off regular upper body clothing? 3  Help from another person to put on and taking off regular lower body clothing? 2  6 Click Score 17  ADL G Code  Conversion CK  OT Goal Progression  Progress towards OT goals Progressing toward goals (no further OT needs)  OT Time Calculation  OT Start Time (ACUTE ONLY) 1420  OT Stop Time (ACUTE ONLY) 1434  OT Time Calculation (min) 14 min  OT General Charges  $OT Visit 1 Procedure  OT Treatments  $Self Care/Home Management  8-22 mins  Lesle Chris, OTR/L [phone] 04/28/2016

## 2016-04-28 NOTE — Care Management Note (Addendum)
Case Management Note  Patient Details  Name: Sara Terry MRN: [id number] Date of Birth: 08/01/1945  Subjective/Objective:                  RIGHT TOTAL HIP ARTHROPLASTY ANTERIOR APPROACH (Right) Action/Plan: Discharge planning Expected Discharge Date:  04/28/16               Expected Discharge Plan:  [place]  In-House Referral:     Discharge planning Services  CM Consult  Post Acute Care Choice:  Home Health Choice offered to:  Patient  DME Arranged:  N/A DME Agency:  NA  HH Arranged:  PT [place] Agency:  [facility]  Status of Service:  Completed, signed off  If discussed at 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  CM called [facility] rep, Joelene Millin who accepts referral.  CM met with pt and explained and gave pt [facility] contact numbers.  No other CM needs were communicated.  CM met with pt in room to offer choice of home health agency. Pt chooses [facility] to render HHPT. Referral given to [facility] rep, Tim. Pt states she has all DME needed at home.No other CM needs were communicated. Dellie Catholic, RN 04/28/2016, 1:37 PM

## 2016-04-28 NOTE — Evaluation (Signed)
Physical Therapy Evaluation Patient Details Name: Sara Terry MRN: [id number] DOB: 1946-02-03 Today's Date: 04/28/2016   History of Present Illness  s/p R THA  Clinical Impression  Pt s/p R THR and presents with decreased R LE strength/ROM and post op pain limiting functional mobility.  Pt should progress to dc home with family assist.    Follow Up Recommendations Home health PT    Equipment Recommendations  None recommended by PT    Recommendations for Other Services OT consult     Precautions / Restrictions Precautions Precautions: Fall Restrictions Weight Bearing Restrictions: No      Mobility  Bed Mobility Overal bed mobility: Needs Assistance Bed Mobility: Supine to Sit     Supine to sit: Min assist     General bed mobility comments: cues for sequence and use of L LE to self assist  Transfers Overall transfer level: Needs assistance Equipment used: Rolling walker (2 wheeled) Transfers: Sit to/from Stand Sit to Stand: Min assist         General transfer comment: cues for LE management and use of UEs to self assist  Ambulation/Gait Ambulation/Gait assistance: Min assist Ambulation Distance (Feet): 20 Feet Assistive device: Rolling walker (2 wheeled) Gait Pattern/deviations: Step-to pattern;Decreased step length - right;Decreased step length - left;Shuffle;Trunk flexed Gait velocity: decr Gait velocity interpretation: Below normal speed for age/gender General Gait Details: cues for sequence, posture and position from RW.  Distance ltd by onset dizziness/claminess - BP 113/73  Stairs            Wheelchair Mobility    Modified Rankin (Stroke Patients Only)       Balance Overall balance assessment: No apparent balance deficits (not formally assessed)                                           Pertinent Vitals/Pain Pain Assessment: 0-10 Pain Score: 4  Pain Location: R hip Pain Descriptors / Indicators: Aching Pain  Intervention(s): Limited activity within patient's tolerance;Monitored during session;Premedicated before session;Ice applied    Home Living Family/patient expects to be discharged to:: Private residence Living Arrangements: Spouse/significant other Available Help at Discharge: Family Type of Home: House Home Access: Stairs to enter Entrance Stairs-Rails: None Technical brewer of Steps: 3 Home Layout: One level Home Equipment: Environmental consultant - 2 wheels      Prior Function Level of Independence: Independent               Hand Dominance        Extremity/Trunk Assessment   Upper Extremity Assessment Upper Extremity Assessment: Overall WFL for tasks assessed    Lower Extremity Assessment Lower Extremity Assessment: RLE deficits/detail RLE Deficits / Details: Strength at hip 2/5 with AAROM at hip to 85 flex and 10 abd    Cervical / Trunk Assessment Cervical / Trunk Assessment: Normal  Communication   Communication: No difficulties  Cognition Arousal/Alertness: Awake/alert Behavior During Therapy: WFL for tasks assessed/performed Overall Cognitive Status: Within Functional Limits for tasks assessed                      General Comments      Exercises Total Joint Exercises Ankle Circles/Pumps: AROM;Both;20 reps;Supine Quad Sets: AROM;Both;10 reps;Supine Heel Slides: AAROM;Right;20 reps;Supine Hip ABduction/ADduction: AAROM;Right;15 reps;Supine   Assessment/Plan    PT Assessment Patient needs continued PT services  PT Problem List Decreased strength;Decreased range  of motion;Decreased activity tolerance;Decreased mobility;Decreased knowledge of use of DME;Pain       PT Treatment Interventions      PT Goals (Current goals can be found in the Care Plan section)  Acute Rehab PT Goals Patient Stated Goal: feel better, back to independence PT Goal Formulation: With patient Time For Goal Achievement: 05/01/16 Potential to Achieve Goals: Good     Frequency 7X/week   Barriers to discharge        Co-evaluation               End of Session Equipment Utilized During Treatment: Gait belt Activity Tolerance: Other (comment);Patient limited by fatigue Patient left: in chair;with call bell/phone within reach Nurse Communication: Mobility status PT Visit Diagnosis: Difficulty in walking, not elsewhere classified (R26.2)         Time: [id number] PT Time Calculation (min) (ACUTE ONLY): 33 min   Charges:   PT Evaluation $PT Eval Low Complexity: 1 Procedure PT Treatments $Therapeutic Exercise: 8-22 mins   PT G Codes:         Ramonia Mcclaran 04/28/2016, 12:27 PM

## 2016-04-28 NOTE — Discharge Summary (Signed)
Physician Discharge Summary   Patient ID: Sara Terry MRN: [id number] DOB/AGE: 05/28/1945 71 y.o.  Admit date: 04/27/2016 Discharge date: 04-28-2016  Primary Diagnosis:  Osteoarthritis of the Right hip.   Admission Diagnoses:  Past Medical History:  Diagnosis Date  . Arthritis   . Atrial fibrillation ([place])   . GERD (gastroesophageal reflux disease)   . Glucose intolerance (impaired glucose tolerance)   . Hypertension   . Insomnia   . Raynaud phenomenon   . Reflux   . Skin cancer    Forehead   Discharge Diagnoses:   Principal Problem:   OA (osteoarthritis) of hip  Estimated body mass index is 27.62 kg/m as calculated from the following:   Height as of this encounter: '5\' 5"'  (1.651 m).   Weight as of this encounter: 75.3 kg (166 lb).  Procedure(s) (LRB): RIGHT TOTAL HIP ARTHROPLASTY ANTERIOR APPROACH (Right)   Consults: None  HPI: Sara Terry is a 71 y.o. female who has advanced end-  stage arthritis of their Right  hip with progressively worsening pain and  dysfunction.The patient has failed nonoperative management and presents for  total hip arthroplasty.   Laboratory Data: Admission on 04/27/2016  Component Date Value Ref Range Status  . Prothrombin Time 04/27/2016 12.6  11.4 - 15.2 seconds Final  . INR 04/27/2016 0.94   Final  . WBC 04/28/2016 11.0* 4.0 - 10.5 K/uL Final  . RBC 04/28/2016 3.24* 3.87 - 5.11 MIL/uL Final  . Hemoglobin 04/28/2016 11.0* 12.0 - 15.0 g/dL Final  . HCT 04/28/2016 30.5* 36.0 - 46.0 % Final  . MCV 04/28/2016 94.1  78.0 - 100.0 fL Final  . MCH 04/28/2016 34.0  26.0 - 34.0 pg Final  . MCHC 04/28/2016 36.1* 30.0 - 36.0 g/dL Final  . RDW 04/28/2016 12.2  11.5 - 15.5 % Final  . Platelets 04/28/2016 264  150 - 400 K/uL Final  . Sodium 04/28/2016 133* 135 - 145 mmol/L Final  . Potassium 04/28/2016 4.1  3.5 - 5.1 mmol/L Final  . Chloride 04/28/2016 102  101 - 111 mmol/L Final  . CO2 04/28/2016 25  22 - 32 mmol/L Final  . Glucose, Bld  04/28/2016 157* 65 - 99 mg/dL Final  . BUN 04/28/2016 13  6 - 20 mg/dL Final  . Creatinine, Ser 04/28/2016 0.39* 0.44 - 1.00 mg/dL Final  . Calcium 04/28/2016 8.8* 8.9 - 10.3 mg/dL Final  . GFR calc non Af Amer 04/28/2016 >60  >60 mL/min Final  . GFR calc Af Amer 04/28/2016 >60  >60 mL/min Final   Comment: (NOTE) The eGFR has been calculated using the CKD EPI equation. This calculation has not been validated in all clinical situations. eGFR's persistently <60 mL/min signify possible Chronic Kidney Disease.   Georgiann Hahn gap 04/28/2016 6  5 - 15 Final  Hospital Outpatient Visit on 04/21/2016  Component Date Value Ref Range Status  . aPTT 04/21/2016 33  24 - 36 seconds Final  . WBC 04/21/2016 5.1  4.0 - 10.5 K/uL Final  . RBC 04/21/2016 3.84* 3.87 - 5.11 MIL/uL Final  . Hemoglobin 04/21/2016 12.9  12.0 - 15.0 g/dL Final  . HCT 04/21/2016 36.9  36.0 - 46.0 % Final  . MCV 04/21/2016 96.1  78.0 - 100.0 fL Final  . MCH 04/21/2016 33.6  26.0 - 34.0 pg Final  . MCHC 04/21/2016 35.0  30.0 - 36.0 g/dL Final  . RDW 04/21/2016 12.3  11.5 - 15.5 % Final  . Platelets 04/21/2016 286  150 -  400 K/uL Final  . Sodium 04/21/2016 137  135 - 145 mmol/L Final  . Potassium 04/21/2016 5.2* 3.5 - 5.1 mmol/L Final  . Chloride 04/21/2016 99* 101 - 111 mmol/L Final  . CO2 04/21/2016 29  22 - 32 mmol/L Final  . Glucose, Bld 04/21/2016 106* 65 - 99 mg/dL Final  . BUN 04/21/2016 15  6 - 20 mg/dL Final  . Creatinine, Ser 04/21/2016 0.59  0.44 - 1.00 mg/dL Final  . Calcium 04/21/2016 9.8  8.9 - 10.3 mg/dL Final  . Total Protein 04/21/2016 6.7  6.5 - 8.1 g/dL Final  . Albumin 04/21/2016 4.4  3.5 - 5.0 g/dL Final  . AST 04/21/2016 22  15 - 41 U/L Final  . ALT 04/21/2016 21  14 - 54 U/L Final  . Alkaline Phosphatase 04/21/2016 63  38 - 126 U/L Final  . Total Bilirubin 04/21/2016 0.6  0.3 - 1.2 mg/dL Final  . GFR calc non Af Amer 04/21/2016 >60  >60 mL/min Final  . GFR calc Af Amer 04/21/2016 >60  >60 mL/min Final    Comment: (NOTE) The eGFR has been calculated using the CKD EPI equation. This calculation has not been validated in all clinical situations. eGFR's persistently <60 mL/min signify possible Chronic Kidney Disease.   . Anion gap 04/21/2016 9  5 - 15 Final  . Prothrombin Time 04/21/2016 15.5* 11.4 - 15.2 seconds Final  . INR 04/21/2016 1.22   Final  . ABO/RH(D) 04/21/2016 A POS   Final  . Antibody Screen 04/21/2016 NEG   Final  . Sample Expiration 04/21/2016 04/30/2016   Final  . Extend sample reason 04/21/2016 NO TRANSFUSIONS OR PREGNANCY IN THE PAST 3 MONTHS   Final  . MRSA, PCR 04/21/2016 NEGATIVE  NEGATIVE Final  . Staphylococcus aureus 04/21/2016 NEGATIVE  NEGATIVE Final   Comment:        The Xpert SA Assay (FDA approved for NASAL specimens in patients over 93 years of age), is one component of a comprehensive surveillance program.  Test performance has been validated by [facility] for patients greater than or equal to 75 year old. It is not intended to diagnose infection nor to guide or monitor treatment.   . ABO/RH(D) 04/21/2016 A POS   Final     X-Rays:Dg Pelvis Portable  Result Date: 04/27/2016 CLINICAL DATA:  71 year old female status post right hip arthroplasty. EXAM: PORTABLE PELVIS 1-2 VIEWS COMPARISON:  12/26/2013. FINDINGS: Portable AP view at 1020 hours. Right hip bipolar type arthroplasty. Hardware appears intact and normally aligned in the AP projection. Postoperative drain in place. Chronic left hip osteoarthritis. No new osseous abnormality identified. IMPRESSION: Right hip bipolar type arthroplasty with no adverse features. Electronically Signed   By: Genevie Ann M.D.   On: 04/27/2016 10:41   Dg C-arm 1-60 Min-no Report  Result Date: 04/27/2016 Fluoroscopy was utilized by the requesting physician.  No radiographic interpretation.    EKG: Orders placed or performed in visit on 02/18/16  . EKG 12-Lead     Hospital Course: Patient was admitted to [facility] and taken to the OR and underwent the above state procedure without complications.  Patient tolerated the procedure well and was later transferred to the recovery room and then to the orthopaedic floor for postoperative care.  They were given PO and IV analgesics for pain control following their surgery.  They were given 24 hours of postoperative antibiotics of  Anti-infectives    Start     Dose/Rate Route  Frequency Ordered Stop   04/27/16 2000  vancomycin (VANCOCIN) IVPB 1000 mg/200 mL premix     1,000 mg 200 mL/hr over 60 Minutes Intravenous Every 12 hours 04/27/16 1216 04/27/16 2158   04/27/16 0610  vancomycin (VANCOCIN) IVPB 1000 mg/200 mL premix     1,000 mg 200 mL/hr over 60 Minutes Intravenous On call to O.R. 04/27/16 3810 04/27/16 0758     and started on DVT prophylaxis in the form of Eliquis.   PT and OT were ordered for total hip protocol.  The patient was allowed to be WBAT with therapy. Discharge planning was consulted to help with postop disposition and equipment needs.  Patient had a decent night on the evening of surgery.  They started to get up OOB with therapy on day one.  Hemovac drain was pulled without difficulty.  Dressing was checked and was clean and intact. Patient was seen in rounds on day one by Dr. Wynelle Link and was ready to go home later that same day following therapy goals.   Discharge home with home health Diet - Cardiac diet Follow up - in 2 weeks Activity - WBAT Disposition - Home Condition Upon Discharge - pending D/C Meds - See DC Summary DVT Prophylaxis - Eliquis  Discharge Instructions    Call MD / Call 911    Complete by:  As directed    If you experience chest pain or shortness of breath, CALL 911 and be transported to the hospital emergency room.  If you develope a fever above 101 F, pus (white drainage) or increased drainage or redness at the wound, or calf pain, call your surgeon's office.   Change dressing    Complete by:  As directed     You may change your dressing dressing daily with sterile 4 x 4 inch gauze dressing and paper tape.  Do not submerge the incision under water.   Constipation Prevention    Complete by:  As directed    Drink plenty of fluids.  Prune juice may be helpful.  You may use a stool softener, such as Colace (over the counter) 100 mg twice a day.  Use MiraLax (over the counter) for constipation as needed.   Diet - low sodium heart healthy    Complete by:  As directed    Discharge instructions    Complete by:  As directed    Pick up stool softner and laxative for home use following surgery while on pain medications. Do not submerge incision under water. Please use good hand washing techniques while changing dressing each day. May shower starting three days after surgery. Please use a clean towel to pat the incision dry following showers. Continue to use ice for pain and swelling after surgery. Do not use any lotions or creams on the incision until instructed by your surgeon.  Wear both TED hose on both legs during the day every day for three weeks, but may have off at night at home.  Postoperative Constipation Protocol  Constipation - defined medically as fewer than three stools per week and severe constipation as less than one stool per week.  One of the most common issues patients have following surgery is constipation.  Even if you have a regular bowel pattern at home, your normal regimen is likely to be disrupted due to multiple reasons following surgery.  Combination of anesthesia, postoperative narcotics, change in appetite and fluid intake all can affect your bowels.  In order to avoid complications following surgery, here are  some recommendations in order to help you during your recovery period.  Colace (docusate) - Pick up an over-the-counter form of Colace or another stool softener and take twice a day as long as you are requiring postoperative pain medications.  Take with a full glass of water  daily.  If you experience loose stools or diarrhea, hold the colace until you stool forms back up.  If your symptoms do not get better within 1 week or if they get worse, check with your doctor.  Dulcolax (bisacodyl) - Pick up over-the-counter and take as directed by the product packaging as needed to assist with the movement of your bowels.  Take with a full glass of water.  Use this product as needed if not relieved by Colace only.   MiraLax (polyethylene glycol) - Pick up over-the-counter to have on hand.  MiraLax is a solution that will increase the amount of water in your bowels to assist with bowel movements.  Take as directed and can mix with a glass of water, juice, soda, coffee, or tea.  Take if you go more than two days without a movement. Do not use MiraLax more than once per day. Call your doctor if you are still constipated or irregular after using this medication for 7 days in a row.  If you continue to have problems with postoperative constipation, please contact the office for further assistance and recommendations.  If you experience "the worst abdominal pain ever" or develop nausea or vomiting, please contact the office immediatly for further recommendations for treatment.   Resume the Eliquis 51m twice a day starting this evening at home following discharge.    Do not sit on low chairs, stoools or toilet seats, as it may be difficult to get up from low surfaces    Complete by:  As directed    Driving restrictions    Complete by:  As directed    No driving until released by the physician.   Increase activity slowly as tolerated    Complete by:  As directed    Lifting restrictions    Complete by:  As directed    No lifting until released by the physician.   Patient may shower    Complete by:  As directed    You may shower without a dressing once there is no drainage.  Do not wash over the wound.  If drainage remains, do not shower until drainage stops.   TED hose    Complete  by:  As directed    Use stockings (TED hose) for 3 weeks on both leg(s).  You may remove them at night for sleeping.   Weight bearing as tolerated    Complete by:  As directed    Laterality:  right   Extremity:  Lower     Allergies as of 04/28/2016      Reactions   Maxzide [[id number]]    Penicillins Hives   Has patient had a PCN reaction causing immediate rash, facial/tongue/throat swelling, SOB or lightheadedness with hypotension: Yes Has patient had a PCN reaction causing severe rash involving mucus membranes or skin necrosis: No Has patient had a PCN reaction that required hospitalization No Has patient had a PCN reaction occurring within the last 10 years: No If all of the above answers are "NO", then may proceed with Cephalosporin use.      Medication List    TAKE these medications   acetaminophen 500 MG tablet Commonly known as:  TYLENOL Take 500  mg by mouth. Patient takes 3 tylenol arthritis two times daily   apixaban 5 MG Tabs tablet Commonly known as:  ELIQUIS Take 1 tablet (5 mg total) by mouth 2 (two) times daily.   atorvastatin 40 MG tablet Commonly known as:  LIPITOR TAKE ONE TABLET BY MOUTH DAILY AT 6 PM.   calcium carbonate 1250 (500 Ca) MG tablet Commonly known as:  OS-CAL - dosed in mg of elemental calcium Take 1 tablet by mouth 2 (two) times daily with a meal.   diltiazem 240 MG 24 hr capsule Commonly known as:  CARDIZEM CD Take 1 capsule (240 mg total) by mouth daily.   docusate sodium 100 MG capsule Commonly known as:  COLACE Take 100 mg by mouth daily.   enalapril 20 MG tablet Commonly known as:  VASOTEC Take 1 tablet (20 mg total) by mouth daily.   hydrochlorothiazide 25 MG tablet Commonly known as:  HYDRODIURIL TAKE (1) TABLET BY MOUTH EACH MORNING.   methocarbamol 500 MG tablet Commonly known as:  ROBAXIN Take 1 tablet (500 mg total) by mouth every 6 (six) hours as needed for muscle spasms.   oxyCODONE 5 MG immediate release  tablet Commonly known as:  Oxy IR/ROXICODONE Take 1-2 tablets (5-10 mg total) by mouth every 4 (four) hours as needed for moderate pain or severe pain.   pantoprazole 40 MG tablet Commonly known as:  PROTONIX TAKE (1) TABLET BY MOUTH EACH MORNING.   traMADol 50 MG tablet Commonly known as:  ULTRAM Take 1-2 tablets (50-100 mg total) by mouth every 6 (six) hours as needed for moderate pain.   zolpidem 10 MG tablet Commonly known as:  AMBIEN Take 1 tablet (10 mg total) by mouth at bedtime as needed for sleep.      Follow-up Information    Gearlean Alf, MD. Schedule an appointment as soon as possible for a visit on 05/10/2016.   Specialty:  Orthopedic Surgery Contact information: [address] [place] [address] [phone]           Signed: Arlee Muslim, PA-C Orthopaedic Surgery 04/28/2016, 8:11 AM

## 2016-04-28 NOTE — Progress Notes (Signed)
   Subjective: 1 Day Post-Op Procedure(s) (LRB): RIGHT TOTAL HIP ARTHROPLASTY ANTERIOR APPROACH (Right) Patient reports pain as mild and moderate.   Patient seen in rounds by Dr. Wynelle Link. Patient is well, but has had some minor complaints of pain in the hip, requiring pain medications We will start therapy today.  If they do well with therapy and meets all goals, then will allow home later this afternoon following therapy. Plan is to go Home after hospital stay.  Objective: Vital signs in last 24 hours: Temp:  [97.3 F (36.3 C)-98.8 F (37.1 C)] 98.7 F (37.1 C) (02/22 0538) Pulse Rate:  [66-102] 66 (02/22 0538) Resp:  [14-26] 16 (02/22 0538) BP: (115-143)/(66-101) 127/76 (02/22 0758) SpO2:  [95 %-100 %] 95 % (02/22 0538) Weight:  [75.3 kg (166 lb)] 75.3 kg (166 lb) (02/21 1215)  Intake/Output from previous day:  Intake/Output Summary (Last 24 hours) at 04/28/16 0803 Last data filed at 04/28/16 0700  Gross per 24 hour  Intake          4048.75 ml  Output             4305 ml  Net          -256.25 ml    Intake/Output this shift: No intake/output data recorded.  Labs:  Recent Labs  04/28/16 0427  HGB 11.0*    Recent Labs  04/28/16 0427  WBC 11.0*  RBC 3.24*  HCT 30.5*  PLT 264    Recent Labs  04/28/16 0427  NA 133*  K 4.1  CL 102  CO2 25  BUN 13  CREATININE 0.39*  GLUCOSE 157*  CALCIUM 8.8*    Recent Labs  04/27/16 0630  INR 0.94    EXAM General - Patient is Alert, Appropriate and Oriented Extremity - Neurovascular intact Sensation intact distally Intact pulses distally Dressing - dressing C/D/I Motor Function - intact, moving foot and toes well on exam.  Hemovac pulled without difficulty.  Past Medical History:  Diagnosis Date  . Arthritis   . Atrial fibrillation ([place])   . GERD (gastroesophageal reflux disease)   . Glucose intolerance (impaired glucose tolerance)   . Hypertension   . Insomnia   . Raynaud phenomenon   . Reflux   .  Skin cancer    Forehead    Assessment/Plan: 1 Day Post-Op Procedure(s) (LRB): RIGHT TOTAL HIP ARTHROPLASTY ANTERIOR APPROACH (Right) Principal Problem:   OA (osteoarthritis) of hip  Estimated body mass index is 27.62 kg/m as calculated from the following:   Height as of this encounter: 5\' 5"  (1.651 m).   Weight as of this encounter: 75.3 kg (166 lb). Advance diet Up with therapy Discharge home with home health  DVT Prophylaxis - Eliquis Weight Bearing As Tolerated right Leg Hemovac Pulled Begin Therapy  If meets goals and able to go home: Discharge home with home health Diet - Cardiac diet Follow up - in 2 weeks Activity - WBAT Disposition - Home Condition Upon Discharge - pending D/C Meds - See DC Summary DVT Prophylaxis - Eliquis  Arlee Muslim, PA-C Orthopaedic Surgery 04/28/2016, 8:03 AM

## 2016-04-28 NOTE — Discharge Instructions (Signed)
° °Dr. Frank Aluisio °Total Joint Specialist °[facility] °[address] °[place], [place] [address] °[phone] ° °ANTERIOR APPROACH TOTAL HIP REPLACEMENT POSTOPERATIVE DIRECTIONS ° ° °Hip Rehabilitation, Guidelines Following Surgery  °The results of a hip operation are greatly improved after range of motion and muscle strengthening exercises. Follow all safety measures which are given to protect your hip. If any of these exercises cause increased pain or swelling in your joint, decrease the amount until you are comfortable again. Then slowly increase the exercises. Call your caregiver if you have problems or questions.  ° °HOME CARE INSTRUCTIONS  °Remove items at home which could result in a fall. This includes throw rugs or furniture in walking pathways.  °· ICE to the affected hip every three hours for 30 minutes at a time and then as needed for pain and swelling.  Continue to use ice on the hip for pain and swelling from surgery. You may notice swelling that will progress down to the foot and ankle.  This is normal after surgery.  Elevate the leg when you are not up walking on it.   °· Continue to use the breathing machine which will help keep your temperature down.  It is common for your temperature to cycle up and down following surgery, especially at night when you are not up moving around and exerting yourself.  The breathing machine keeps your lungs expanded and your temperature down. ° ° °DIET °You may resume your previous home diet once your are discharged from the hospital. ° °DRESSING / WOUND CARE / SHOWERING °You may shower 3 days after surgery, but keep the wounds dry during showering.  You may use an occlusive plastic wrap (Press'n Seal for example), NO SOAKING/SUBMERGING IN THE BATHTUB.  If the bandage gets wet, change with a clean dry gauze.  If the incision gets wet, pat the wound dry with a clean towel. °You may start showering once you are discharged home but do not  submerge the incision under water. Just pat the incision dry and apply a dry gauze dressing on daily. °Change the surgical dressing daily and reapply a dry dressing each time. ° °ACTIVITY °Walk with your walker as instructed. °Use walker as long as suggested by your caregivers. °Avoid periods of inactivity such as sitting longer than an hour when not asleep. This helps prevent blood clots.  °You may resume a sexual relationship in one month or when given the OK by your doctor.  °You may return to work once you are cleared by your doctor.  °Do not drive a car for 6 weeks or until released by you surgeon.  °Do not drive while taking narcotics. ° °WEIGHT BEARING °Weight bearing as tolerated with assist device (walker, cane, etc) as directed, use it as long as suggested by your surgeon or therapist, typically at least 4-6 weeks. ° °POSTOPERATIVE CONSTIPATION PROTOCOL °Constipation - defined medically as fewer than three stools per week and severe constipation as less than one stool per week. ° °One of the most common issues patients have following surgery is constipation.  Even if you have a regular bowel pattern at home, your normal regimen is likely to be disrupted due to multiple reasons following surgery.  Combination of anesthesia, postoperative narcotics, change in appetite and fluid intake all can affect your bowels.  In order to avoid complications following surgery, here are some recommendations in order to help you during your recovery period. ° °Colace (docusate) - Pick up an over-the-counter   form of Colace or another stool softener and take twice a day as long as you are requiring postoperative pain medications.  Take with a full glass of water daily.  If you experience loose stools or diarrhea, hold the colace until you stool forms back up.  If your symptoms do not get better within 1 week or if they get worse, check with your doctor. ° °Dulcolax (bisacodyl) - Pick up over-the-counter and take as directed  by the product packaging as needed to assist with the movement of your bowels.  Take with a full glass of water.  Use this product as needed if not relieved by Colace only.  ° °MiraLax (polyethylene glycol) - Pick up over-the-counter to have on hand.  MiraLax is a solution that will increase the amount of water in your bowels to assist with bowel movements.  Take as directed and can mix with a glass of water, juice, soda, coffee, or tea.  Take if you go more than two days without a movement. °Do not use MiraLax more than once per day. Call your doctor if you are still constipated or irregular after using this medication for 7 days in a row. ° °If you continue to have problems with postoperative constipation, please contact the office for further assistance and recommendations.  If you experience "the worst abdominal pain ever" or develop nausea or vomiting, please contact the office immediatly for further recommendations for treatment. ° °ITCHING ° If you experience itching with your medications, try taking only a single pain pill, or even half a pain pill at a time.  You can also use Benadryl over the counter for itching or also to help with sleep.  ° °TED HOSE STOCKINGS °Wear the elastic stockings on both legs for three weeks following surgery during the day but you may remove then at night for sleeping. ° °MEDICATIONS °See your medication summary on the “After Visit Summary” that the nursing staff will review with you prior to discharge.  You may have some home medications which will be placed on hold until you complete the course of blood thinner medication.  It is important for you to complete the blood thinner medication as prescribed by your surgeon.  Continue your approved medications as instructed at time of discharge. ° °PRECAUTIONS °If you experience chest pain or shortness of breath - call 911 immediately for transfer to the hospital emergency department.  °If you develop a fever greater that 101 F,  purulent drainage from wound, increased redness or drainage from wound, foul odor from the wound/dressing, or calf pain - CONTACT YOUR SURGEON.   °                                                °FOLLOW-UP APPOINTMENTS °Make sure you keep all of your appointments after your operation with your surgeon and caregivers. You should call the office at the above phone number and make an appointment for approximately two weeks after the date of your surgery or on the date instructed by your surgeon outlined in the "After Visit Summary". ° °RANGE OF MOTION AND STRENGTHENING EXERCISES  °These exercises are designed to help you keep full movement of your hip joint. Follow your caregiver's or physical therapist's instructions. Perform all exercises about fifteen times, three times per day or as directed. Exercise both hips, even if you   have had only one joint replacement. These exercises can be done on a training (exercise) mat, on the floor, on a table or on a bed. Use whatever works the best and is most comfortable for you. Use music or television while you are exercising so that the exercises are a pleasant break in your day. This will make your life better with the exercises acting as a break in routine you can look forward to.  Lying on your back, slowly slide your foot toward your buttocks, raising your knee up off the floor. Then slowly slide your foot back down until your leg is straight again.  Lying on your back spread your legs as far apart as you can without causing discomfort.  Lying on your side, raise your upper leg and foot straight up from the floor as far as is comfortable. Slowly lower the leg and repeat.  Lying on your back, tighten up the muscle in the front of your thigh (quadriceps muscles). You can do this by keeping your leg straight and trying to raise your heel off the floor. This helps strengthen the largest muscle supporting your knee.  Lying on your back, tighten up the muscles of your  buttocks both with the legs straight and with the knee bent at a comfortable angle while keeping your heel on the floor.   IF YOU ARE TRANSFERRED TO A SKILLED REHAB FACILITY If the patient is transferred to a skilled rehab facility following release from the hospital, a list of the current medications will be sent to the facility for the patient to continue.  When discharged from the skilled rehab facility, please have the facility set up the patient's [place] prior to being released. Also, the skilled facility will be responsible for providing the patient with their medications at time of release from the facility to include their pain medication, the muscle relaxants, and their blood thinner medication. If the patient is still at the rehab facility at time of the two week follow up appointment, the skilled rehab facility will also need to assist the patient in arranging follow up appointment in our office and any transportation needs.  MAKE SURE YOU:  Understand these instructions.  Get help right away if you are not doing well or get worse.    Pick up stool softner and laxative for home use following surgery while on pain medications. Do not submerge incision under water. Please use good hand washing techniques while changing dressing each day. May shower starting three days after surgery. Please use a clean towel to pat the incision dry following showers. Continue to use ice for pain and swelling after surgery. Do not use any lotions or creams on the incision until instructed by your surgeon.  Resume the Eliquis 5mg  twice a day starting this evening at home following discharge.

## 2016-04-29 DIAGNOSIS — Z471 Aftercare following joint replacement surgery: Secondary | ICD-10-CM | POA: Diagnosis not present

## 2016-04-29 DIAGNOSIS — Z96641 Presence of right artificial hip joint: Secondary | ICD-10-CM | POA: Diagnosis not present

## 2016-04-29 DIAGNOSIS — I10 Essential (primary) hypertension: Secondary | ICD-10-CM | POA: Diagnosis not present

## 2016-04-29 DIAGNOSIS — I4891 Unspecified atrial fibrillation: Secondary | ICD-10-CM | POA: Diagnosis not present

## 2016-04-29 DIAGNOSIS — R7302 Impaired glucose tolerance (oral): Secondary | ICD-10-CM | POA: Diagnosis not present

## 2016-05-02 DIAGNOSIS — Z471 Aftercare following joint replacement surgery: Secondary | ICD-10-CM | POA: Diagnosis not present

## 2016-05-02 DIAGNOSIS — R7302 Impaired glucose tolerance (oral): Secondary | ICD-10-CM | POA: Diagnosis not present

## 2016-05-02 DIAGNOSIS — Z96641 Presence of right artificial hip joint: Secondary | ICD-10-CM | POA: Diagnosis not present

## 2016-05-02 DIAGNOSIS — I10 Essential (primary) hypertension: Secondary | ICD-10-CM | POA: Diagnosis not present

## 2016-05-02 DIAGNOSIS — I4891 Unspecified atrial fibrillation: Secondary | ICD-10-CM | POA: Diagnosis not present

## 2016-05-04 DIAGNOSIS — Z471 Aftercare following joint replacement surgery: Secondary | ICD-10-CM | POA: Diagnosis not present

## 2016-05-04 DIAGNOSIS — R7302 Impaired glucose tolerance (oral): Secondary | ICD-10-CM | POA: Diagnosis not present

## 2016-05-04 DIAGNOSIS — I10 Essential (primary) hypertension: Secondary | ICD-10-CM | POA: Diagnosis not present

## 2016-05-04 DIAGNOSIS — Z96641 Presence of right artificial hip joint: Secondary | ICD-10-CM | POA: Diagnosis not present

## 2016-05-04 DIAGNOSIS — I4891 Unspecified atrial fibrillation: Secondary | ICD-10-CM | POA: Diagnosis not present

## 2016-05-06 DIAGNOSIS — Z471 Aftercare following joint replacement surgery: Secondary | ICD-10-CM | POA: Diagnosis not present

## 2016-05-06 DIAGNOSIS — R7302 Impaired glucose tolerance (oral): Secondary | ICD-10-CM | POA: Diagnosis not present

## 2016-05-06 DIAGNOSIS — Z96641 Presence of right artificial hip joint: Secondary | ICD-10-CM | POA: Diagnosis not present

## 2016-05-06 DIAGNOSIS — I10 Essential (primary) hypertension: Secondary | ICD-10-CM | POA: Diagnosis not present

## 2016-05-06 DIAGNOSIS — I4891 Unspecified atrial fibrillation: Secondary | ICD-10-CM | POA: Diagnosis not present

## 2016-05-09 DIAGNOSIS — I10 Essential (primary) hypertension: Secondary | ICD-10-CM | POA: Diagnosis not present

## 2016-05-09 DIAGNOSIS — Z471 Aftercare following joint replacement surgery: Secondary | ICD-10-CM | POA: Diagnosis not present

## 2016-05-09 DIAGNOSIS — I4891 Unspecified atrial fibrillation: Secondary | ICD-10-CM | POA: Diagnosis not present

## 2016-05-09 DIAGNOSIS — R7302 Impaired glucose tolerance (oral): Secondary | ICD-10-CM | POA: Diagnosis not present

## 2016-05-09 DIAGNOSIS — Z96641 Presence of right artificial hip joint: Secondary | ICD-10-CM | POA: Diagnosis not present

## 2016-05-13 DIAGNOSIS — R7302 Impaired glucose tolerance (oral): Secondary | ICD-10-CM | POA: Diagnosis not present

## 2016-05-13 DIAGNOSIS — I10 Essential (primary) hypertension: Secondary | ICD-10-CM | POA: Diagnosis not present

## 2016-05-13 DIAGNOSIS — Z96641 Presence of right artificial hip joint: Secondary | ICD-10-CM | POA: Diagnosis not present

## 2016-05-13 DIAGNOSIS — I4891 Unspecified atrial fibrillation: Secondary | ICD-10-CM | POA: Diagnosis not present

## 2016-05-13 DIAGNOSIS — Z471 Aftercare following joint replacement surgery: Secondary | ICD-10-CM | POA: Diagnosis not present

## 2016-05-16 DIAGNOSIS — Z471 Aftercare following joint replacement surgery: Secondary | ICD-10-CM | POA: Diagnosis not present

## 2016-05-16 DIAGNOSIS — R7302 Impaired glucose tolerance (oral): Secondary | ICD-10-CM | POA: Diagnosis not present

## 2016-05-16 DIAGNOSIS — I4891 Unspecified atrial fibrillation: Secondary | ICD-10-CM | POA: Diagnosis not present

## 2016-05-16 DIAGNOSIS — Z96641 Presence of right artificial hip joint: Secondary | ICD-10-CM | POA: Diagnosis not present

## 2016-05-16 DIAGNOSIS — I10 Essential (primary) hypertension: Secondary | ICD-10-CM | POA: Diagnosis not present

## 2016-05-19 DIAGNOSIS — Z96641 Presence of right artificial hip joint: Secondary | ICD-10-CM | POA: Diagnosis not present

## 2016-05-19 DIAGNOSIS — Z471 Aftercare following joint replacement surgery: Secondary | ICD-10-CM | POA: Diagnosis not present

## 2016-05-19 DIAGNOSIS — R7302 Impaired glucose tolerance (oral): Secondary | ICD-10-CM | POA: Diagnosis not present

## 2016-05-19 DIAGNOSIS — I4891 Unspecified atrial fibrillation: Secondary | ICD-10-CM | POA: Diagnosis not present

## 2016-05-19 DIAGNOSIS — I10 Essential (primary) hypertension: Secondary | ICD-10-CM | POA: Diagnosis not present

## 2016-06-07 DIAGNOSIS — Z96641 Presence of right artificial hip joint: Secondary | ICD-10-CM | POA: Diagnosis not present

## 2016-06-07 DIAGNOSIS — Z471 Aftercare following joint replacement surgery: Secondary | ICD-10-CM | POA: Diagnosis not present

## 2016-06-16 ENCOUNTER — Encounter: Payer: Self-pay | Admitting: Podiatry

## 2016-06-22 ENCOUNTER — Ambulatory Visit (INDEPENDENT_AMBULATORY_CARE_PROVIDER_SITE_OTHER): Payer: Medicare HMO | Admitting: Family Medicine

## 2016-06-22 VITALS — BP 118/80 | Ht 66.0 in | Wt 163.4 lb

## 2016-06-22 DIAGNOSIS — I10 Essential (primary) hypertension: Secondary | ICD-10-CM

## 2016-06-22 DIAGNOSIS — E7800 Pure hypercholesterolemia, unspecified: Secondary | ICD-10-CM

## 2016-06-22 DIAGNOSIS — I4891 Unspecified atrial fibrillation: Secondary | ICD-10-CM | POA: Diagnosis not present

## 2016-06-22 NOTE — Progress Notes (Signed)
   Subjective:    Patient ID: Sara Terry, female    DOB: 09/26/1945, 71 y.o.   MRN: [id number] Patient arrives office with several concerns Hyperlipidemia  This is a chronic problem. The current episode started more than 1 year ago. Treatments tried: atorvastatin. There are no compliance problems (starting to exercise again, eats healthy).    Pt states no concerns today.   Patient continues to take lipid medication regularly. No obvious side effects from it. Generally does not miss a dose. Prior blood work results are reviewed with patient. Patient continues to work on fat intake in diet  Hx of a fib now on numerous meds for ti me. Taking L occlusive faithfully. Also on a couple medications to control rhythm. Tolerating well. Exercising regularly.  Blood pressure medicine and blood pressure levels reviewed today with patient. Compliant with blood pressure medicine. States does not miss a dose. No obvious side effects. Blood pressure generally good when checked elsewhere. Watching salt intake.  Has lost weight intentionally,   Review of Systems No headache, no major weight loss or weight gain, no chest pain no back pain abdominal pain no change in bowel habits complete ROS otherwise negative     Objective:   Physical Exam  Alert and oriented, vitals reviewed and stable, NAD ENT-TM's and ext canals WNL bilat via otoscopic exam Soft palate, tonsils and post pharynx WNL via oropharyngeal exam Neck-symmetric, no masses; thyroid nonpalpable and nontender Pulmonary-no tachypnea or accessory muscle use; Clear without wheezes via auscultation Card--no abnrml murmurs, not regular rhythm, however rate control rWNL Carotid pulses symmetric, without bruits Abdom      Assessment & Plan:  Impression 1 hypertension good control discussed maintain same meds #2 hyperlipidemia. Cardiologist been somewhat aggressive. Patient had very nice high HDL relatively mild LDL elevation the past  premedication. His longest tolerates statin well certainly support maintain #3 atrial fibrillation good control multiple aspects discussed  Medications clarify discussed. Refills given were appropriate. Diet exercise discussed. Patient to follow-up in the future with her cardiologist on these cardio vascular issues [facility]

## 2016-07-15 ENCOUNTER — Ambulatory Visit: Payer: Medicare HMO | Admitting: Podiatry

## 2016-07-15 DIAGNOSIS — R69 Illness, unspecified: Secondary | ICD-10-CM | POA: Diagnosis not present

## 2016-07-18 ENCOUNTER — Encounter: Payer: Self-pay | Admitting: Family Medicine

## 2016-07-18 DIAGNOSIS — Z1231 Encounter for screening mammogram for malignant neoplasm of breast: Secondary | ICD-10-CM | POA: Diagnosis not present

## 2016-07-19 ENCOUNTER — Telehealth: Payer: Self-pay | Admitting: Family Medicine

## 2016-07-19 NOTE — Telephone Encounter (Signed)
Review screening mammogram results in results folder. °

## 2016-07-26 ENCOUNTER — Other Ambulatory Visit: Payer: Self-pay | Admitting: Adult Health

## 2016-08-15 DIAGNOSIS — R69 Illness, unspecified: Secondary | ICD-10-CM | POA: Diagnosis not present

## 2016-09-01 ENCOUNTER — Encounter: Payer: Self-pay | Admitting: Cardiovascular Disease

## 2016-09-01 ENCOUNTER — Ambulatory Visit (INDEPENDENT_AMBULATORY_CARE_PROVIDER_SITE_OTHER): Payer: Medicare HMO | Admitting: Cardiovascular Disease

## 2016-09-01 VITALS — BP 135/87 | HR 96 | Ht 65.0 in | Wt 160.0 lb

## 2016-09-01 DIAGNOSIS — E785 Hyperlipidemia, unspecified: Secondary | ICD-10-CM

## 2016-09-01 DIAGNOSIS — I4821 Permanent atrial fibrillation: Secondary | ICD-10-CM

## 2016-09-01 DIAGNOSIS — I10 Essential (primary) hypertension: Secondary | ICD-10-CM

## 2016-09-01 DIAGNOSIS — I482 Chronic atrial fibrillation: Secondary | ICD-10-CM

## 2016-09-01 NOTE — Progress Notes (Signed)
SUBJECTIVE: The patient presents for follow-up of chronic atrial fibrillation. I last saw her while she was hospitalized in September 2017. She remains asymptomatic with respect to atrial fibrillation. She has no bleeding problems with Eliquis. She underwent successful right hip replacement earlier this year. She denies chest pain, shortness of breath, leg swelling, and palpitations.   Review of Systems: As per "subjective", otherwise negative.  Allergies  Allergen Reactions  . Maxzide [Triamterene-Hctz]   . Penicillins Hives    Has patient had a PCN reaction causing immediate rash, facial/tongue/throat swelling, SOB or lightheadedness with hypotension: Yes Has patient had a PCN reaction causing severe rash involving mucus membranes or skin necrosis: No Has patient had a PCN reaction that required hospitalization No Has patient had a PCN reaction occurring within the last 10 years: No If all of the above answers are "NO", then may proceed with Cephalosporin use.     Current Outpatient Prescriptions  Medication Sig Dispense Refill  . acetaminophen (TYLENOL) 500 MG tablet Take 500 mg by mouth. Patient takes 3 tylenol arthritis two times daily    . apixaban (ELIQUIS) 5 MG TABS tablet Take 1 tablet (5 mg total) by mouth 2 (two) times daily. 60 tablet 6  . atorvastatin (LIPITOR) 40 MG tablet TAKE 1 TABLET DAILY AT 6PM 30 tablet 3  . calcium carbonate (OS-CAL - DOSED IN MG OF ELEMENTAL CALCIUM) 1250 (500 Ca) MG tablet Take 1 tablet by mouth 2 (two) times daily with a meal.     . diltiazem (CARDIZEM CD) 240 MG 24 hr capsule TAKE 1 CAPSULE DAILY 30 capsule 3  . docusate sodium (COLACE) 100 MG capsule Take 100 mg by mouth daily.    . enalapril (VASOTEC) 20 MG tablet Take 1 tablet (20 mg total) by mouth daily. 30 tablet 5  . hydrochlorothiazide (HYDRODIURIL) 25 MG tablet TAKE (1) TABLET BY MOUTH EACH MORNING. 30 tablet 5  . pantoprazole (PROTONIX) 40 MG tablet TAKE (1) TABLET BY MOUTH EACH  MORNING. 90 tablet 1  . zolpidem (AMBIEN) 10 MG tablet Take 1 tablet (10 mg total) by mouth at bedtime as needed for sleep. 30 tablet 5   No current facility-administered medications for this visit.     Past Medical History:  Diagnosis Date  . Arthritis   . Atrial fibrillation ([place])   . GERD (gastroesophageal reflux disease)   . Glucose intolerance (impaired glucose tolerance)   . Hypertension   . Insomnia   . Raynaud phenomenon   . Reflux   . Skin cancer    Forehead    Past Surgical History:  Procedure Laterality Date  . ABDOMINOPLASTY    . BREAST REDUCTION SURGERY  2003  . COLONOSCOPY    . COLONOSCOPY N/A 06/13/2013   Procedure: COLONOSCOPY;  Surgeon: Rogene Houston, MD;  Location: [facility];  Service: Endoscopy;  Laterality: N/A;  930  . FOOT SURGERY Left 02/09/2016  . TONSILLECTOMY    . TOTAL HIP ARTHROPLASTY Right 04/27/2016   Procedure: RIGHT TOTAL HIP ARTHROPLASTY ANTERIOR APPROACH;  Surgeon: Gaynelle Arabian, MD;  Location: [facility];  Service: Orthopedics;  Laterality: Right;  . TUBAL LIGATION      Social History   Social History  . Marital status: Married    Spouse name: N/A  . Number of children: N/A  . Years of education: N/A   Occupational History  . Not on file.   Social History Main Topics  . Smoking status: Former Smoker  Start date: 03/07/1964    Quit date: 03/07/1978  . Smokeless tobacco: Never Used  . Alcohol use Yes     Comment: 2.5 glasses daily   . Drug use: No  . Sexual activity: Not on file   Other Topics Concern  . Not on file   Social History Narrative  . No narrative on file     Vitals:   09/01/16 0810  BP: 135/87  Pulse: 96  Weight: 160 lb (72.6 kg)  Height: 5\' 5"  (1.651 m)    Wt Readings from Last 3 Encounters:  09/01/16 160 lb (72.6 kg)  06/22/16 163 lb 6.4 oz (74.1 kg)  04/27/16 166 lb (75.3 kg)     PHYSICAL EXAM General: NAD HEENT: Normal. Neck: No JVD, no thyromegaly. Lungs: Clear to auscultation bilaterally  with normal respiratory effort. CV: Nondisplaced PMI.  Regular rate and irregular rhythm, normal S1/S2, no S3, no murmur. No pretibial or periankle edema.  No carotid bruit.   Abdomen: Soft, nontender, no distention.  Neurologic: Alert and oriented.  Psych: Normal affect. Skin: Normal. Musculoskeletal: No gross deformities.    ECG: Most recent ECG reviewed.   Labs: Lab Results  Component Value Date/Time   K 4.1 04/28/2016 04:27 AM   BUN 13 04/28/2016 04:27 AM   BUN 15 07/09/2015 08:13 AM   CREATININE 0.39 (L) 04/28/2016 04:27 AM   CREATININE 0.60 10/26/2012 09:55 AM   ALT 21 04/21/2016 10:21 AM   TSH 1.471 11/08/2015 08:40 AM   HGB 11.0 (L) 04/28/2016 04:27 AM     Lipids: Lab Results  Component Value Date/Time   LDLCALC 117 (H) 07/09/2015 08:13 AM   CHOL 195 07/09/2015 08:13 AM   TRIG 86 07/09/2015 08:13 AM   HDL 61 07/09/2015 08:13 AM       ASSESSMENT AND PLAN: 1. Chronic atrial fibrillation: She is entirely asymptomatic. Heart rate is controlled on long-acting diltiazem. Continue Eliquis for anticoagulation.  2. Hypertension: Controlled on hydrochlorothiazide and enalapril. No changes.  3. Hyperlipidemia: Continue Lipitor 40 mg.    Disposition: Follow up 6 months.  Kate Sable, M.D., F.A.C.C.

## 2016-09-01 NOTE — Patient Instructions (Signed)
Your physician wants you to follow-up in:  6 months with Dr.Koneswaran You will receive a reminder letter in the mail two months in advance. If you don't receive a letter, please call our office to schedule the follow-up appointment.    Your physician recommends that you continue on your current medications as directed. Please refer to the Current Medication list given to you today.    If you need a refill on your cardiac medications before your next appointment, please call your pharmacy.     No testing ordered today.    Thank you for choosing [facility] !        

## 2016-09-03 ENCOUNTER — Encounter: Payer: Self-pay | Admitting: Family Medicine

## 2016-09-05 ENCOUNTER — Encounter: Payer: Self-pay | Admitting: Nurse Practitioner

## 2016-10-01 ENCOUNTER — Other Ambulatory Visit: Payer: Self-pay | Admitting: Adult Health

## 2016-10-03 ENCOUNTER — Telehealth: Payer: Self-pay | Admitting: Cardiovascular Disease

## 2016-10-03 NOTE — Telephone Encounter (Signed)
Requesting samples of Eliquis / tg  °

## 2016-10-03 NOTE — Telephone Encounter (Signed)
One sample box of Eliquis placed at the front desk for pick up for pt. Lot# [id number] EXP: DEC 2020

## 2016-10-23 ENCOUNTER — Other Ambulatory Visit: Payer: Self-pay | Admitting: Adult Health

## 2016-10-25 ENCOUNTER — Other Ambulatory Visit: Payer: Self-pay

## 2016-10-25 ENCOUNTER — Encounter: Payer: Self-pay | Admitting: Adult Health

## 2016-10-25 MED ORDER — APIXABAN 5 MG PO TABS: 5.0000 mg | ORAL_TABLET | Freq: Two times a day (BID) | ORAL | 3 refills | Status: DC

## 2016-10-25 NOTE — Telephone Encounter (Signed)
First attempt failed, e-scribed eliquis again to [employer] order

## 2016-10-25 NOTE — Telephone Encounter (Signed)
Refilled eliquis per pt email request

## 2016-10-27 ENCOUNTER — Encounter: Payer: Self-pay | Admitting: Adult Health

## 2016-11-28 ENCOUNTER — Other Ambulatory Visit: Payer: Self-pay | Admitting: Adult Health

## 2016-12-23 ENCOUNTER — Encounter: Payer: Self-pay | Admitting: Family Medicine

## 2016-12-26 MED ORDER — PANTOPRAZOLE SODIUM 40 MG PO TBEC: DELAYED_RELEASE_TABLET | ORAL | 1 refills | Status: DC

## 2017-01-15 ENCOUNTER — Other Ambulatory Visit: Payer: Self-pay | Admitting: Adult Health

## 2017-01-16 DIAGNOSIS — R69 Illness, unspecified: Secondary | ICD-10-CM | POA: Diagnosis not present

## 2017-01-30 DIAGNOSIS — R69 Illness, unspecified: Secondary | ICD-10-CM | POA: Diagnosis not present

## 2017-02-08 ENCOUNTER — Encounter: Payer: Self-pay | Admitting: Podiatry

## 2017-02-08 ENCOUNTER — Ambulatory Visit (INDEPENDENT_AMBULATORY_CARE_PROVIDER_SITE_OTHER): Payer: Medicare HMO | Admitting: Podiatry

## 2017-02-08 DIAGNOSIS — Q828 Other specified congenital malformations of skin: Secondary | ICD-10-CM

## 2017-02-08 DIAGNOSIS — M779 Enthesopathy, unspecified: Secondary | ICD-10-CM | POA: Diagnosis not present

## 2017-02-08 DIAGNOSIS — L84 Corns and callosities: Secondary | ICD-10-CM

## 2017-02-08 NOTE — Progress Notes (Signed)
Subjective:   Patient ID: Sara Terry, female   DOB: 71 y.o.   MRN: [id number]   HPI Patient presents stating I am doing okay but I am having some discomfort around the area where the surgery was done with keratotic lesion formation   ROS      Objective:  Physical Exam  Neurovascular status intact with small keratotic lesion underneath the fifth metatarsal head left that has a small core within it but no 1 year the pain is prior to surgery     Assessment:  Doing better post osteotomy left fifth metatarsal with small keratotic lesion present     Plan:  H&P condition reviewed debrided lesion and do not think this will be a long-term issue for patient.  Reviewed condition

## 2017-03-10 ENCOUNTER — Ambulatory Visit: Payer: Medicare HMO | Admitting: Cardiovascular Disease

## 2017-03-20 ENCOUNTER — Other Ambulatory Visit: Payer: Self-pay | Admitting: *Deleted

## 2017-03-20 ENCOUNTER — Encounter: Payer: Self-pay | Admitting: Podiatry

## 2017-03-20 MED ORDER — DILTIAZEM HCL ER COATED BEADS 240 MG PO CP24: 240.0000 mg | ORAL_CAPSULE | Freq: Every day | ORAL | 1 refills | Status: DC

## 2017-03-20 MED ORDER — HYDROCHLOROTHIAZIDE 25 MG PO TABS: 25.0000 mg | ORAL_TABLET | Freq: Every morning | ORAL | 1 refills | Status: DC

## 2017-03-20 MED ORDER — ENALAPRIL MALEATE 20 MG PO TABS: 20.0000 mg | ORAL_TABLET | Freq: Every day | ORAL | 1 refills | Status: DC

## 2017-03-22 ENCOUNTER — Encounter: Payer: Self-pay | Admitting: Podiatry

## 2017-03-29 ENCOUNTER — Ambulatory Visit: Payer: Medicare HMO | Admitting: Cardiovascular Disease

## 2017-04-12 ENCOUNTER — Ambulatory Visit (INDEPENDENT_AMBULATORY_CARE_PROVIDER_SITE_OTHER): Payer: Medicare HMO | Admitting: Cardiovascular Disease

## 2017-04-12 ENCOUNTER — Other Ambulatory Visit: Payer: Self-pay

## 2017-04-12 ENCOUNTER — Encounter: Payer: Self-pay | Admitting: Cardiovascular Disease

## 2017-04-12 ENCOUNTER — Encounter: Payer: Self-pay | Admitting: Family Medicine

## 2017-04-12 VITALS — BP 128/90 | HR 105 | Ht 65.0 in | Wt 167.4 lb

## 2017-04-12 DIAGNOSIS — Z79899 Other long term (current) drug therapy: Secondary | ICD-10-CM

## 2017-04-12 DIAGNOSIS — E785 Hyperlipidemia, unspecified: Secondary | ICD-10-CM | POA: Diagnosis not present

## 2017-04-12 DIAGNOSIS — D649 Anemia, unspecified: Secondary | ICD-10-CM | POA: Diagnosis not present

## 2017-04-12 DIAGNOSIS — I10 Essential (primary) hypertension: Secondary | ICD-10-CM

## 2017-04-12 DIAGNOSIS — I4891 Unspecified atrial fibrillation: Secondary | ICD-10-CM

## 2017-04-12 DIAGNOSIS — Z7901 Long term (current) use of anticoagulants: Secondary | ICD-10-CM

## 2017-04-12 MED ORDER — DILTIAZEM HCL ER COATED BEADS 300 MG PO CP24: 300.0000 mg | ORAL_CAPSULE | Freq: Every day | ORAL | 3 refills | Status: DC

## 2017-04-12 NOTE — Progress Notes (Signed)
SUBJECTIVE: The patient presents for follow-up of permanent atrial fibrillation.  She is systemically anticoagulated with Eliquis.  ECG performed today which I personally reviewed demonstrated rapid atrial fibrillation, 107 bpm.  The patient denies any symptoms of chest pain, palpitations, shortness of breath, lightheadedness, dizziness, leg swelling, orthopnea, PND, and syncope.  She does line dancing and water aerobics.  Her husband is unable to accompany her due to severe back pain.    Review of Systems: As per "subjective", otherwise negative.  Allergies  Allergen Reactions  . Maxzide [Triamterene-Hctz]   . Penicillins Hives    Has patient had a PCN reaction causing immediate rash, facial/tongue/throat swelling, SOB or lightheadedness with hypotension: Yes Has patient had a PCN reaction causing severe rash involving mucus membranes or skin necrosis: No Has patient had a PCN reaction that required hospitalization No Has patient had a PCN reaction occurring within the last 10 years: No If all of the above answers are "NO", then may proceed with Cephalosporin use.     Current Outpatient Medications  Medication Sig Dispense Refill  . acetaminophen (TYLENOL) 500 MG tablet Take 500 mg by mouth. Patient takes 3 tylenol arthritis two times daily    . apixaban (ELIQUIS) 5 MG TABS tablet Take 1 tablet (5 mg total) by mouth 2 (two) times daily. 180 tablet 3  . atorvastatin (LIPITOR) 40 MG tablet TAKE 1 TABLET DAILY AT 6PM 30 tablet 6  . calcium carbonate (OS-CAL - DOSED IN MG OF ELEMENTAL CALCIUM) 1250 (500 Ca) MG tablet Take 1 tablet by mouth 2 (two) times daily with a meal.     . diltiazem (CARDIZEM CD) 240 MG 24 hr capsule Take 1 capsule (240 mg total) by mouth daily. 90 capsule 1  . docusate sodium (COLACE) 100 MG capsule Take 100 mg by mouth daily.    . enalapril (VASOTEC) 20 MG tablet Take 1 tablet (20 mg total) by mouth daily. 90 tablet 1  . hydrochlorothiazide  (HYDRODIURIL) 25 MG tablet Take 1 tablet (25 mg total) by mouth every morning. 90 tablet 1  . pantoprazole (PROTONIX) 40 MG tablet TAKE (1) TABLET BY MOUTH EACH MORNING. (Patient taking differently: Take 40 mg by mouth daily as needed. TAKE (1) TABLET BY MOUTH EACH MORNING.) 90 tablet 1  . zolpidem (AMBIEN) 10 MG tablet Take 1 tablet (10 mg total) by mouth at bedtime as needed for sleep. 30 tablet 5   No current facility-administered medications for this visit.     Past Medical History:  Diagnosis Date  . Arthritis   . Atrial fibrillation ([place])   . GERD (gastroesophageal reflux disease)   . Glucose intolerance (impaired glucose tolerance)   . Hypertension   . Insomnia   . Raynaud phenomenon   . Reflux   . Skin cancer    Forehead    Past Surgical History:  Procedure Laterality Date  . ABDOMINOPLASTY    . BREAST REDUCTION SURGERY  2003  . COLONOSCOPY    . COLONOSCOPY N/A 06/13/2013   Procedure: COLONOSCOPY;  Surgeon: Rogene Houston, MD;  [facility];  Service: Endoscopy;  Laterality: N/A;  930  . FOOT SURGERY Left 02/09/2016  . TONSILLECTOMY    . TOTAL HIP ARTHROPLASTY Right 04/27/2016   Procedure: RIGHT TOTAL HIP ARTHROPLASTY ANTERIOR APPROACH;  Surgeon: Gaynelle Arabian, MD;  Location: [facility];  Service: Orthopedics;  Laterality: Right;  . TUBAL LIGATION      Social History   Socioeconomic History  .  Marital status: Married    Spouse name: Not on file  . Number of children: Not on file  . Years of education: Not on file  . Highest education level: Not on file  Social Needs  . Financial resource strain: Not on file  . Food insecurity - worry: Not on file  . Food insecurity - inability: Not on file  . Transportation needs - medical: Not on file  . Transportation needs - non-medical: Not on file  Occupational History  . Not on file  Tobacco Use  . Smoking status: Former Smoker    Start date: 03/07/1964    Last attempt to quit: 03/07/1978    Years since quitting:  39.1  . Smokeless tobacco: Never Used  Substance and Sexual Activity  . Alcohol use: Yes    Comment: 2.5 glasses daily   . Drug use: No  . Sexual activity: Not on file  Other Topics Concern  . Not on file  Social History Narrative  . Not on file     Vitals:   04/12/17 1252  BP: 128/90  Pulse: (!) 105  Weight: 167 lb 6.4 oz (75.9 kg)  Height: 5\' 5"  (1.651 m)    Wt Readings from Last 3 Encounters:  04/12/17 167 lb 6.4 oz (75.9 kg)  09/01/16 160 lb (72.6 kg)  06/22/16 163 lb 6.4 oz (74.1 kg)     PHYSICAL EXAM General: NAD HEENT: Normal. Neck: No JVD, no thyromegaly. Lungs: Clear to auscultation bilaterally with normal respiratory effort. CV: Tachycardic, irregular rhythm, normal S1/S2, no S3, no murmur. No pretibial or periankle edema.  No carotid bruit.   Abdomen: Soft, nontender, no distention.  Neurologic: Alert and oriented.  Psych: Normal affect. Skin: Normal. Musculoskeletal: No gross deformities.    ECG: Most recent ECG reviewed.   Labs: Lab Results  Component Value Date/Time   K 4.1 04/28/2016 04:27 AM   BUN 13 04/28/2016 04:27 AM   BUN 15 07/09/2015 08:13 AM   CREATININE 0.39 (L) 04/28/2016 04:27 AM   CREATININE 0.60 10/26/2012 09:55 AM   ALT 21 04/21/2016 10:21 AM   TSH 1.471 11/08/2015 08:40 AM   HGB 11.0 (L) 04/28/2016 04:27 AM     Lipids: Lab Results  Component Value Date/Time   LDLCALC 117 (H) 07/09/2015 08:13 AM   CHOL 195 07/09/2015 08:13 AM   TRIG 86 07/09/2015 08:13 AM   HDL 61 07/09/2015 08:13 AM       ASSESSMENT AND PLAN: 1.  Rapid atrial fibrillation (permanent): She is asymptomatic.  I will increase long-acting diltiazem from 240 to 300 mg daily.  Continue systemic anticoagulation with apixaban.  2.  Hypertension: DBP mildly elevated on hydrochlorothiazide and enalapril.  I will monitor given increased dose of diltiazem as noted above.  3.  Hyperlipidemia: Continue Lipitor 40 mg.  I will check a lipid panel.  4.  Anemia: I  will check a CBC and basic metabolic panel.    Disposition: Follow up 1 year   Kate Sable, M.D., F.A.C.C.

## 2017-04-12 NOTE — Patient Instructions (Signed)
Medication Instructions:  Your physician has recommended you make the following change in your medication:  Increase Cardizem CD to 300 mg Daily     Labwork: Your physician recommends that you return for lab work in: Today    Testing/Procedures: NONE   Follow-Up: Your physician wants you to follow-up in: 1 Year with Dr. Bronson Ing.  You will receive a reminder letter in the mail two months in advance. If you don't receive a letter, please call our office to schedule the follow-up appointment.   Any Other Special Instructions Will Be Listed Below (If Applicable).     If you need a refill on your cardiac medications before your next appointment, please call your pharmacy. Thank you for choosing [facility]!

## 2017-04-13 MED ORDER — ZOLPIDEM TARTRATE 10 MG PO TABS: 10.0000 mg | ORAL_TABLET | Freq: Every evening | ORAL | 1 refills | Status: DC | PRN

## 2017-04-14 ENCOUNTER — Other Ambulatory Visit: Payer: Self-pay | Admitting: Cardiovascular Disease

## 2017-04-14 ENCOUNTER — Telehealth: Payer: Self-pay | Admitting: Family Medicine

## 2017-04-14 DIAGNOSIS — Z7901 Long term (current) use of anticoagulants: Secondary | ICD-10-CM | POA: Diagnosis not present

## 2017-04-14 DIAGNOSIS — Z79899 Other long term (current) drug therapy: Secondary | ICD-10-CM | POA: Diagnosis not present

## 2017-04-14 NOTE — Telephone Encounter (Signed)
Rx prior auth APPROVED for pt's zolpidem (AMBIEN) 10 MG tablet   Referral #:  [id number], vali 03/05/17-03/06/18 through [facility]  Faxed approval [facility], sent to be scanned & filed

## 2017-04-15 LAB — CBC/DIFF AMBIGUOUS DEFAULT
BASOS ABS: 0 10*3/uL (ref 0.0–0.2)
Basos: 0 %
EOS (ABSOLUTE): 0.1 10*3/uL (ref 0.0–0.4)
Eos: 2 %
HEMOGLOBIN: 13 g/dL (ref 11.1–15.9)
Hematocrit: 38.9 % (ref 34.0–46.6)
IMMATURE GRANS (ABS): 0 10*3/uL (ref 0.0–0.1)
IMMATURE GRANULOCYTES: 0 %
LYMPHS: 42 %
Lymphocytes Absolute: 2 10*3/uL (ref 0.7–3.1)
MCH: 32.7 pg (ref 26.6–33.0)
MCHC: 33.4 g/dL (ref 31.5–35.7)
MCV: 98 fL — ABNORMAL HIGH (ref 79–97)
MONOCYTES: 9 %
Monocytes Absolute: 0.4 10*3/uL (ref 0.1–0.9)
NEUTROS ABS: 2.2 10*3/uL (ref 1.4–7.0)
NEUTROS PCT: 47 %
Platelets: 286 10*3/uL (ref 150–379)
RBC: 3.97 x10E6/uL (ref 3.77–5.28)
RDW: 13 % (ref 12.3–15.4)
WBC: 4.8 10*3/uL (ref 3.4–10.8)

## 2017-04-15 LAB — BASIC METABOLIC PANEL
BUN / CREAT RATIO: 25 (ref 12–28)
BUN: 16 mg/dL (ref 8–27)
CALCIUM: 9.4 mg/dL (ref 8.7–10.3)
CHLORIDE: 95 mmol/L — AB (ref 96–106)
CO2: 24 mmol/L (ref 20–29)
CREATININE: 0.65 mg/dL (ref 0.57–1.00)
GFR calc Af Amer: 104 mL/min/{1.73_m2} (ref >59)
GFR calc non Af Amer: 90 mL/min/{1.73_m2} (ref >59)
GLUCOSE: 105 mg/dL — AB (ref 65–99)
Potassium: 4.4 mmol/L (ref 3.5–5.2)
Sodium: 134 mmol/L (ref 134–144)

## 2017-04-15 LAB — LIPID PANEL W/O CHOL/HDL RATIO
Cholesterol, Total: 148 mg/dL (ref 100–199)
HDL: 77 mg/dL (ref >39)
LDL CALC: 63 mg/dL (ref 0–99)
TRIGLYCERIDES: 40 mg/dL (ref 0–149)
VLDL CHOLESTEROL CAL: 8 mg/dL (ref 5–40)

## 2017-04-15 LAB — SPECIMEN STATUS REPORT

## 2017-04-30 DIAGNOSIS — R69 Illness, unspecified: Secondary | ICD-10-CM | POA: Diagnosis not present

## 2017-05-04 DIAGNOSIS — Z96641 Presence of right artificial hip joint: Secondary | ICD-10-CM | POA: Diagnosis not present

## 2017-05-04 DIAGNOSIS — M1611 Unilateral primary osteoarthritis, right hip: Secondary | ICD-10-CM | POA: Diagnosis not present

## 2017-05-04 DIAGNOSIS — Z471 Aftercare following joint replacement surgery: Secondary | ICD-10-CM | POA: Diagnosis not present

## 2017-05-05 DIAGNOSIS — Z96641 Presence of right artificial hip joint: Secondary | ICD-10-CM | POA: Insufficient documentation

## 2017-05-07 ENCOUNTER — Encounter: Payer: Self-pay | Admitting: Podiatry

## 2017-06-10 IMAGING — CT CT NECK W/ CM
3 of 4 series · 13 of 33 positions shown, 16 images · IV contrast (iopamidol)
Comparison: None.

CLINICAL DATA: Increasing right-sided facial and neck swelling
beginning 2 days ago.

EXAM:
CT NECK WITH CONTRAST
TECHNIQUE: Multidetector CT imaging of the neck was performed using the
standard protocol following the bolus administration of intravenous
contrast.
CONTRAST:  75mL XXLXWK-2SS IOPAMIDOL (XXLXWK-2SS) INJECTION 61%

[Series 2: axial neck · axial · 0.46mm/px · z∈[+126,+272]mm · 5 of 111 slices shown, 7 images]
[im 19/111  soft-tissue]
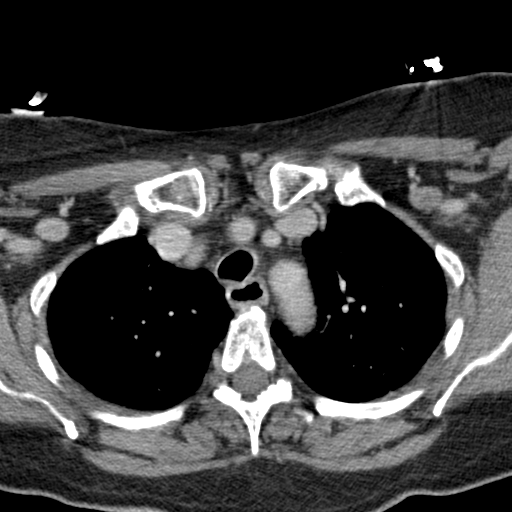
[im 19/111  bone]
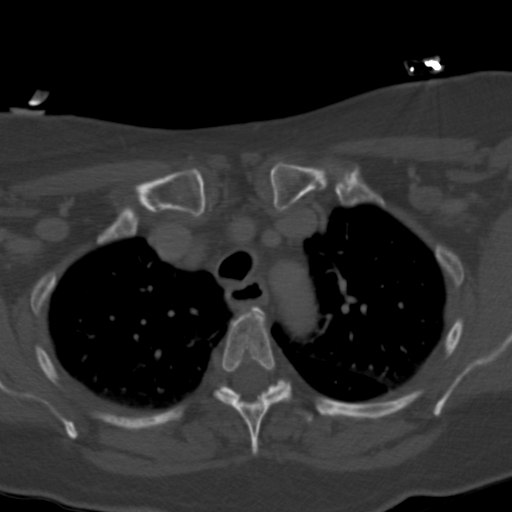
[im 37/111  bone]
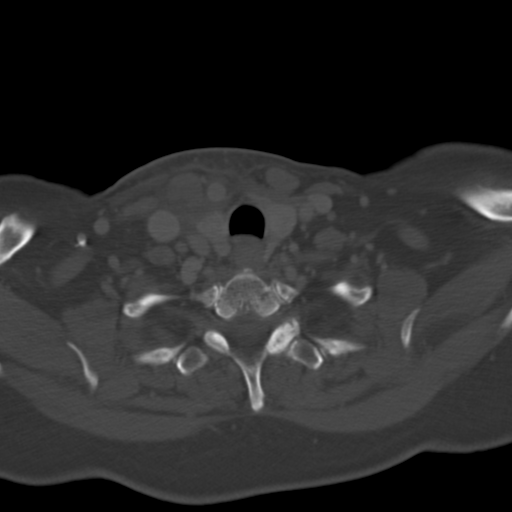
[im 56/111  bone]
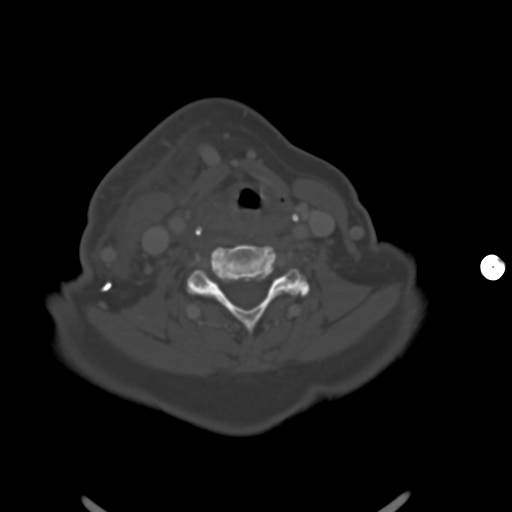
[im 74/111  bone]
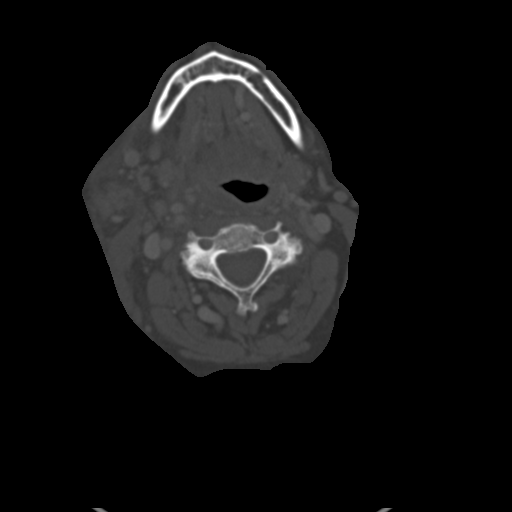
[im 92/111  soft-tissue]
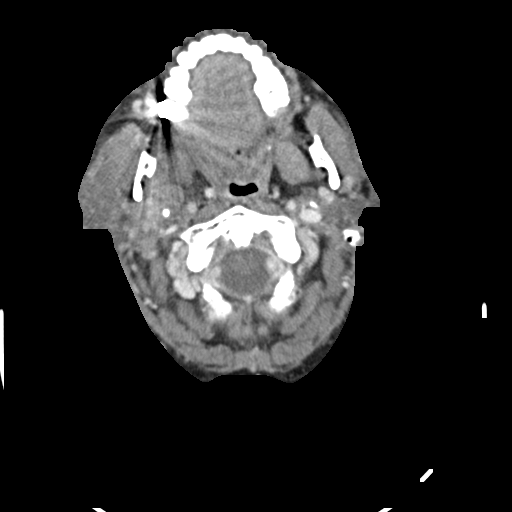
[im 92/111  bone]
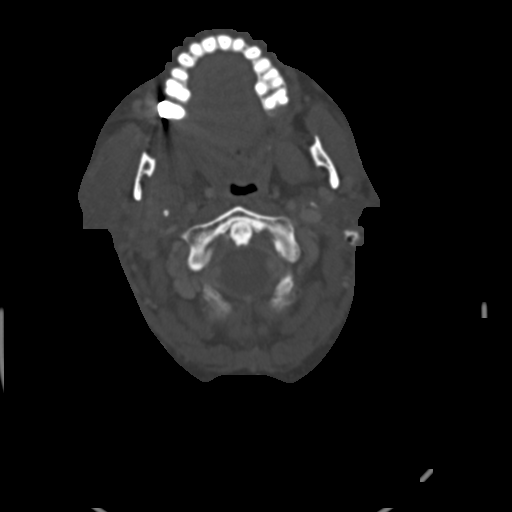

[Series 4: sag neck · sagittal · 0.43mm/px · 5 of 85 slices shown, 6 images]
[im 29/85  bone]
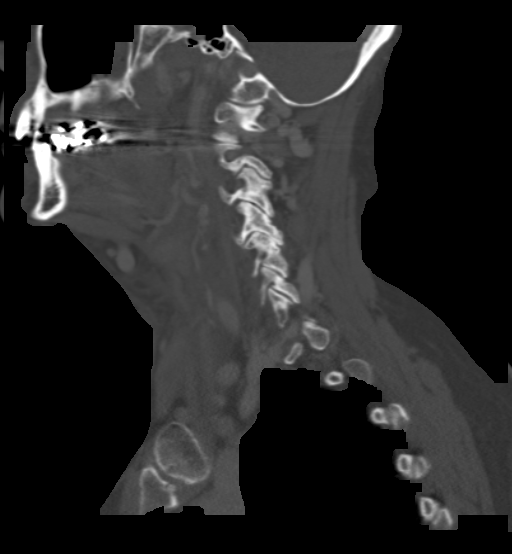
[im 36/85  bone]
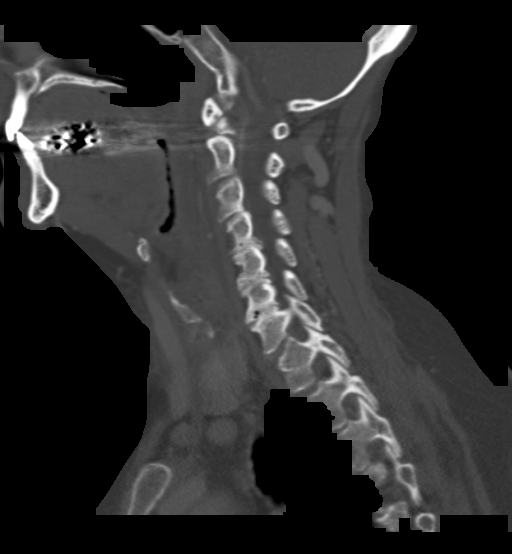
[im 43/85  soft-tissue]
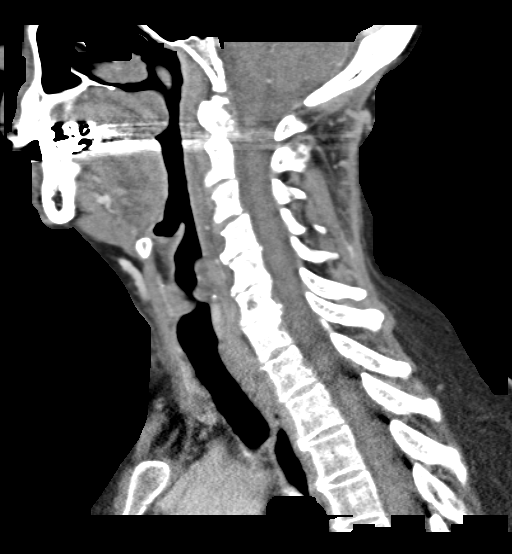
[im 43/85  bone]
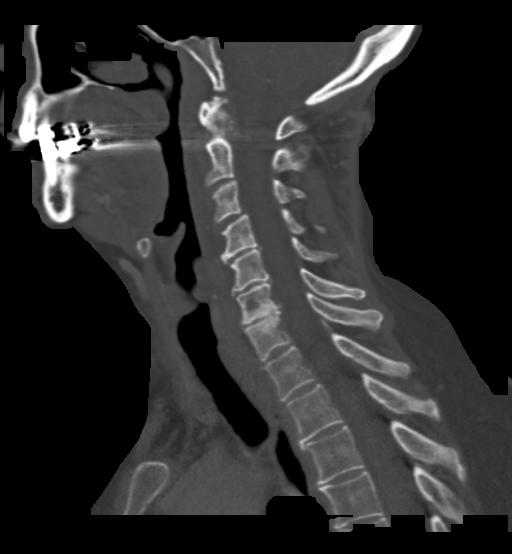
[im 50/85  bone]
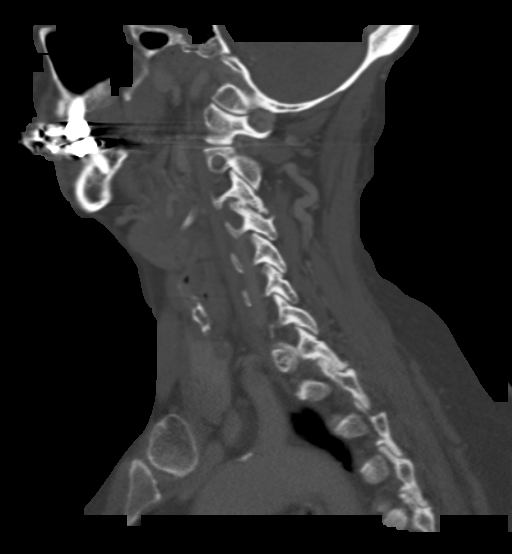
[im 57/85  bone]
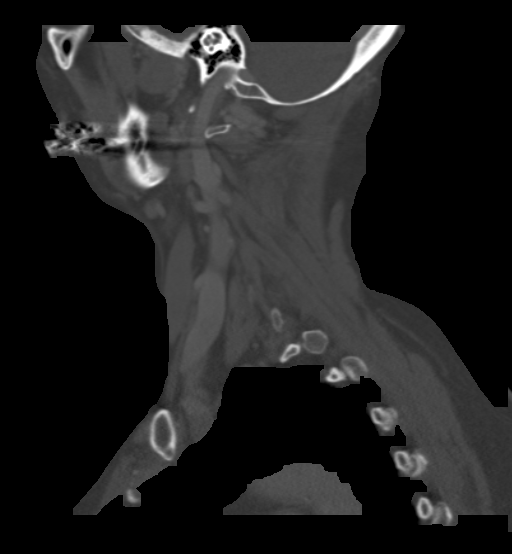

[Series 6: cor neck · coronal · 0.34mm/px · 3 of 94 slices shown]
[im 19/94  bone]
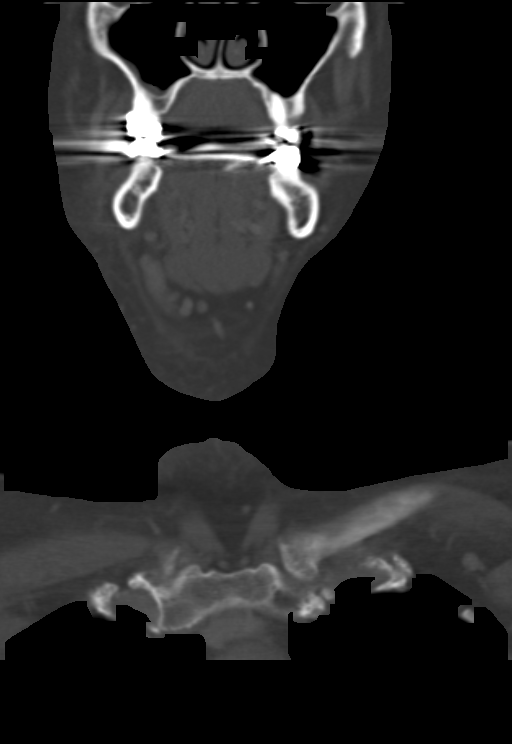
[im 38/94  bone]
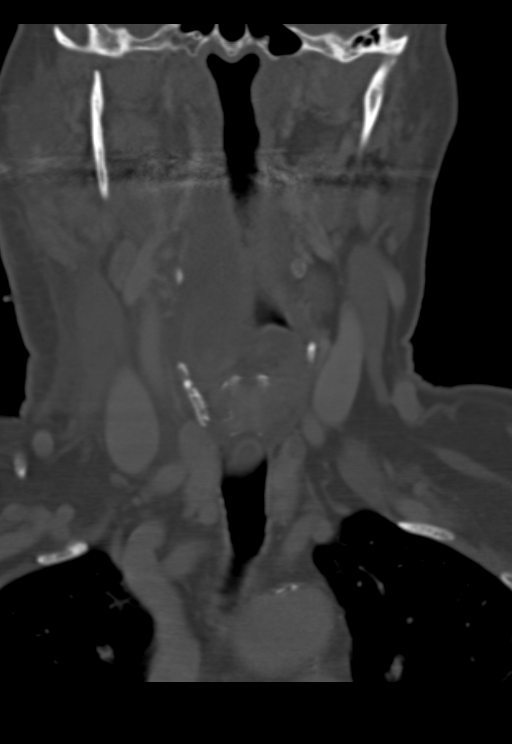
[im 56/94  bone]
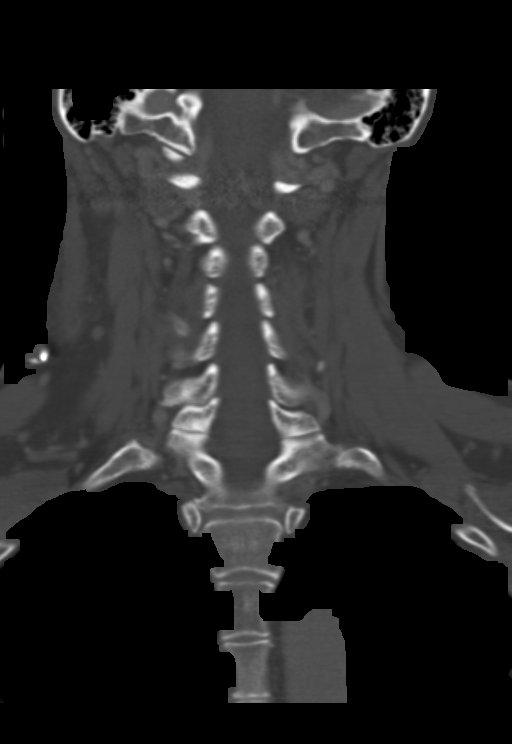

[13 of 33 positions shown; findings below may reference images not displayed]

FINDINGS: Pharynx and larynx: Diffuse right parapharyngeal space inflammatory
change and unorganized fluid with inflammation extending inferiorly
in the neck in the carotid and visceral spaces. There is distortion
of the right side of the oropharynx. The airway remains patent.
Edema involves the right-sided epiglottis and aryepiglottic fold. No
pharyngeal or laryngeal mass identified. Retropharyngeal edema and
small retropharyngeal effusion.

Salivary glands: Both submandibular glands are prominent in size,
however the right is larger and appears edematous with surrounding
submandibular space inflammation. There is marked diffuse asymmetric
enlargement of the right parotid gland with patchy areas of
hyperenhancement, most notably in the tail and deep lobe. Evaluation
of the parotid glands (and evaluation for ductal stones) is limited
by metallic dental streak artifact. There is prominent right
periparotid inflammatory change which extends inferiorly throughout
the right facial soft tissues and into the right neck. There is
thickening of the platysma on the right.

Thyroid: Multiple hypoattenuating thyroid nodules, with the largest
measuring 11 mm in the isthmus.

Lymph nodes: Multiple small right level II lymph nodes measure up to
7 mm in short axis and are likely reactive.

Vascular: Major vascular structures of the neck appear patent.
Atherosclerotic plaque is noted at the right greater than left
carotid bifurcations without significant stenosis.

Limited intracranial: Unremarkable.

Visualized orbits: Not imaged.

Mastoids and visualized paranasal sinuses: Partially visualized
moderate volume secretions in the right maxillary sinus with mild
mucosal thickening. Visualized mastoid air cells are clear.

Skeleton: Cervical disc degeneration, advanced at C6-7. Advanced
left facet arthrosis at C3-4 with grade 1 anterolisthesis. Moderate
atlantodental degenerative changes.

Upper chest: No consolidation or mass in the visualized lung apices.
IMPRESSION: 1. Marked enlargement and patchy hyperenhancement of the right
parotid gland compatible with acute parotitis. Extensive
inflammation throughout the right face and neck including right
parapharyngeal space with edema in the epiglottis and aryepiglottic
fold on the right. Patent airway. No abscess.
2. Right submandibular gland sialadenitis.

## 2017-07-06 ENCOUNTER — Other Ambulatory Visit: Payer: Self-pay | Admitting: *Deleted

## 2017-07-06 MED ORDER — ATORVASTATIN CALCIUM 40 MG PO TABS: ORAL_TABLET | ORAL | 3 refills | Status: DC

## 2017-07-18 DIAGNOSIS — R69 Illness, unspecified: Secondary | ICD-10-CM | POA: Diagnosis not present

## 2017-07-24 ENCOUNTER — Encounter: Payer: Self-pay | Admitting: Family Medicine

## 2017-07-24 DIAGNOSIS — Z1231 Encounter for screening mammogram for malignant neoplasm of breast: Secondary | ICD-10-CM | POA: Diagnosis not present

## 2017-07-24 DIAGNOSIS — Z803 Family history of malignant neoplasm of breast: Secondary | ICD-10-CM | POA: Diagnosis not present

## 2017-07-26 ENCOUNTER — Telehealth: Payer: Self-pay | Admitting: Family Medicine

## 2017-07-26 NOTE — Telephone Encounter (Signed)
Review screening mammogram results from [facility] in results folder. °

## 2017-10-02 ENCOUNTER — Other Ambulatory Visit: Payer: Self-pay | Admitting: *Deleted

## 2017-10-02 MED ORDER — HYDROCHLOROTHIAZIDE 25 MG PO TABS: 25.0000 mg | ORAL_TABLET | Freq: Every morning | ORAL | 3 refills | Status: DC

## 2017-11-01 ENCOUNTER — Other Ambulatory Visit: Payer: Self-pay

## 2017-11-01 MED ORDER — ENALAPRIL MALEATE 20 MG PO TABS: 20.0000 mg | ORAL_TABLET | Freq: Every day | ORAL | 1 refills | Status: DC

## 2017-11-28 IMAGING — DX DG PORTABLE PELVIS
1 series · 1 of 1 positions shown · non-contrast
Comparison: 12/26/2013.

CLINICAL DATA: 70-year-old female status post right hip
arthroplasty.

EXAM:
PORTABLE PELVIS 1-2 VIEWS

[pelvis ap]
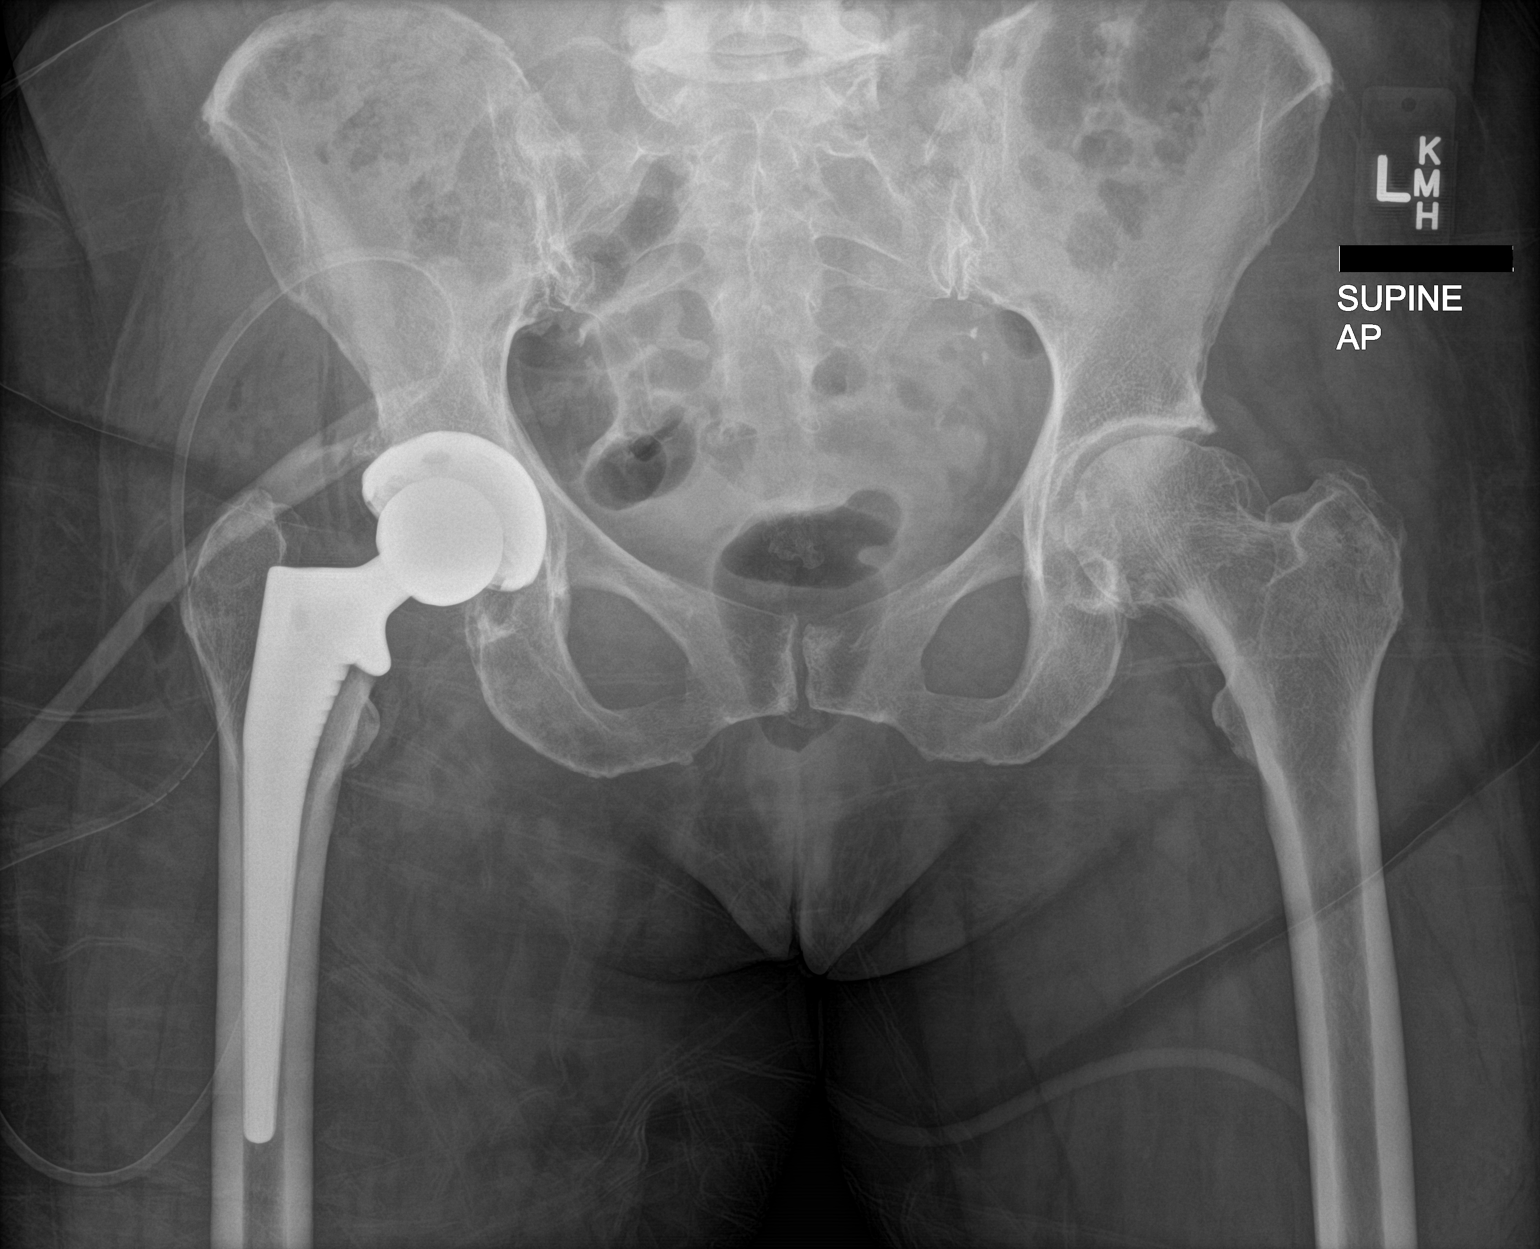

[1 of 1 positions shown; findings below may reference images not displayed]

FINDINGS: Portable AP view at 4505 hours. Right hip bipolar type arthroplasty.
Hardware appears intact and normally aligned in the AP projection.
Postoperative drain in place. Chronic left hip osteoarthritis. No
new osseous abnormality identified.
IMPRESSION: Right hip bipolar type arthroplasty with no adverse features.

## 2017-11-29 ENCOUNTER — Other Ambulatory Visit: Payer: Self-pay | Admitting: Cardiovascular Disease

## 2018-01-21 DIAGNOSIS — R69 Illness, unspecified: Secondary | ICD-10-CM | POA: Diagnosis not present

## 2018-01-25 DIAGNOSIS — R69 Illness, unspecified: Secondary | ICD-10-CM | POA: Diagnosis not present

## 2018-04-01 ENCOUNTER — Other Ambulatory Visit: Payer: Self-pay | Admitting: Cardiovascular Disease

## 2018-04-12 ENCOUNTER — Ambulatory Visit (INDEPENDENT_AMBULATORY_CARE_PROVIDER_SITE_OTHER): Payer: Medicare HMO | Admitting: Cardiovascular Disease

## 2018-04-12 ENCOUNTER — Encounter: Payer: Self-pay | Admitting: Cardiovascular Disease

## 2018-04-12 ENCOUNTER — Other Ambulatory Visit (HOSPITAL_COMMUNITY)
Admission: RE | Admit: 2018-04-12 | Discharge: 2018-04-12 | Disposition: A | Discharge status: Home / Self Care | Payer: Medicare HMO | Source: Ambulatory Visit | Attending: Cardiovascular Disease | Admitting: Cardiovascular Disease

## 2018-04-12 VITALS — BP 130/84 | HR 82 | Ht 65.0 in | Wt 169.8 lb

## 2018-04-12 DIAGNOSIS — I10 Essential (primary) hypertension: Secondary | ICD-10-CM | POA: Insufficient documentation

## 2018-04-12 DIAGNOSIS — E782 Mixed hyperlipidemia: Secondary | ICD-10-CM | POA: Insufficient documentation

## 2018-04-12 DIAGNOSIS — D649 Anemia, unspecified: Secondary | ICD-10-CM

## 2018-04-12 DIAGNOSIS — I4821 Permanent atrial fibrillation: Secondary | ICD-10-CM

## 2018-04-12 LAB — LIPID PANEL
Cholesterol: 164 mg/dL (ref 0–200)
HDL: 73 mg/dL (ref >40)
LDL Cholesterol: 84 mg/dL (ref 0–99)
Total CHOL/HDL Ratio: 2.2 RATIO
Triglycerides: 35 mg/dL (ref <150)
VLDL: 7 mg/dL (ref 0–40)

## 2018-04-12 LAB — BASIC METABOLIC PANEL
ANION GAP: 8 (ref 5–15)
BUN: 15 mg/dL (ref 8–23)
CO2: 28 mmol/L (ref 22–32)
Calcium: 9.2 mg/dL (ref 8.9–10.3)
Chloride: 101 mmol/L (ref 98–111)
Creatinine, Ser: 0.59 mg/dL (ref 0.44–1.00)
GFR calc Af Amer: >60 mL/min (ref >60)
GFR calc non Af Amer: >60 mL/min (ref >60)
Glucose, Bld: 113 mg/dL — ABNORMAL HIGH (ref 70–99)
POTASSIUM: 4.4 mmol/L (ref 3.5–5.1)
Sodium: 137 mmol/L (ref 135–145)

## 2018-04-12 LAB — CBC
HCT: 40.1 % (ref 36.0–46.0)
Hemoglobin: 13 g/dL (ref 12.0–15.0)
MCH: 31.6 pg (ref 26.0–34.0)
MCHC: 32.4 g/dL (ref 30.0–36.0)
MCV: 97.3 fL (ref 80.0–100.0)
PLATELETS: 275 10*3/uL (ref 150–400)
RBC: 4.12 MIL/uL (ref 3.87–5.11)
RDW: 11.9 % (ref 11.5–15.5)
WBC: 5.8 10*3/uL (ref 4.0–10.5)
nRBC: 0 % (ref 0.0–0.2)

## 2018-04-12 NOTE — Progress Notes (Signed)
SUBJECTIVE: The patient presents for follow-up of permanent atrial fibrillation.  She is systemically anticoagulated with Eliquis.  She enjoys line dancing and water aerobics.  Her husband sees Dr. Claiborne Billings but she would like for him to begin seeing me for convenience as they live in [place].  The patient denies any symptoms of chest pain, palpitations, shortness of breath, lightheadedness, dizziness, leg swelling, orthopnea, PND, and syncope.  ECG performed in the office today which I ordered and personally interpreted demonstrates rate controlled atrial fibrillation.  She requests labs to be ordered.  Her daughter will be getting married for the second time this summer at the beach.  Her son is an Metallurgist and lives in [place].   Review of Systems: As per "subjective", otherwise negative.  Allergies  Allergen Reactions  . Maxzide [Triamterene-Hctz]   . Penicillins Hives    Has patient had a PCN reaction causing immediate rash, facial/tongue/throat swelling, SOB or lightheadedness with hypotension: Yes Has patient had a PCN reaction causing severe rash involving mucus membranes or skin necrosis: No Has patient had a PCN reaction that required hospitalization No Has patient had a PCN reaction occurring within the last 10 years: No If all of the above answers are "NO", then may proceed with Cephalosporin use.     Current Outpatient Medications  Medication Sig Dispense Refill  . acetaminophen (TYLENOL) 500 MG tablet Take 500 mg by mouth. Patient takes 3 tylenol arthritis two times daily    . atorvastatin (LIPITOR) 40 MG tablet TAKE 1 TABLET DAILY AT 6PM 90 tablet 3  . calcium carbonate (OS-CAL - DOSED IN MG OF ELEMENTAL CALCIUM) 1250 (500 Ca) MG tablet Take 1 tablet by mouth 2 (two) times daily with a meal.     . diltiazem (CARDIZEM CD) 300 MG 24 hr capsule TAKE 1 CAPSULE DAILY 90 capsule 3  . docusate sodium (COLACE) 100 MG capsule Take 100 mg by  mouth daily.    Marland Kitchen ELIQUIS 5 MG TABS tablet TAKE 1 TABLET TWICE A DAY 180 tablet 3  . enalapril (VASOTEC) 20 MG tablet Take 1 tablet (20 mg total) by mouth daily. 90 tablet 1  . hydrochlorothiazide (HYDRODIURIL) 25 MG tablet Take 1 tablet (25 mg total) by mouth every morning. 90 tablet 3  . pantoprazole (PROTONIX) 40 MG tablet TAKE (1) TABLET BY MOUTH EACH MORNING. (Patient taking differently: Take 40 mg by mouth daily as needed. TAKE (1) TABLET BY MOUTH EACH MORNING.) 90 tablet 1  . zolpidem (AMBIEN) 10 MG tablet Take 1 tablet (10 mg total) by mouth at bedtime as needed for sleep. 30 tablet 1   No current facility-administered medications for this visit.     Past Medical History:  Diagnosis Date  . Arthritis   . Atrial fibrillation ([place])   . GERD (gastroesophageal reflux disease)   . Glucose intolerance (impaired glucose tolerance)   . Hypertension   . Insomnia   . Raynaud phenomenon   . Reflux   . Skin cancer    Forehead    Past Surgical History:  Procedure Laterality Date  . ABDOMINOPLASTY    . BREAST REDUCTION SURGERY  2003  . COLONOSCOPY    . COLONOSCOPY N/A 06/13/2013   Procedure: COLONOSCOPY;  Surgeon: Rogene Houston, MD;  [facility];  Service: Endoscopy;  Laterality: N/A;  930  . FOOT SURGERY Left 02/09/2016  . TONSILLECTOMY    . TOTAL HIP ARTHROPLASTY Right 04/27/2016   Procedure: RIGHT TOTAL  HIP ARTHROPLASTY ANTERIOR APPROACH;  Surgeon: Gaynelle Arabian, MD;  Location: [facility];  Service: Orthopedics;  Laterality: Right;  . TUBAL LIGATION      Social History   Socioeconomic History  . Marital status: Married    Spouse name: Not on file  . Number of children: Not on file  . Years of education: Not on file  . Highest education level: Not on file  Occupational History  . Not on file  Social Needs  . Financial resource strain: Not on file  . Food insecurity:    Worry: Not on file    Inability: Not on file  . Transportation needs:    Medical: Not on  file    Non-medical: Not on file  Tobacco Use  . Smoking status: Former Smoker    Start date: 03/07/1964    Last attempt to quit: 03/07/1978    Years since quitting: 40.1  . Smokeless tobacco: Never Used  Substance and Sexual Activity  . Alcohol use: Yes    Comment: 2.5 glasses daily   . Drug use: No  . Sexual activity: Not on file  Lifestyle  . Physical activity:    Days per week: Not on file    Minutes per session: Not on file  . Stress: Not on file  Relationships  . Social connections:    Talks on phone: Not on file    Gets together: Not on file    Attends religious service: Not on file    Active member of club or organization: Not on file    Attends meetings of clubs or organizations: Not on file    Relationship status: Not on file  . Intimate partner violence:    Fear of current or ex partner: Not on file    Emotionally abused: Not on file    Physically abused: Not on file    Forced sexual activity: Not on file  Other Topics Concern  . Not on file  Social History Narrative  . Not on file     Vitals:   04/12/18 0852  BP: 130/84  Pulse: 82  SpO2: 97%  Weight: 169 lb 12.8 oz (77 kg)  Height: 5\' 5"  (1.651 m)    Wt Readings from Last 3 Encounters:  04/12/18 169 lb 12.8 oz (77 kg)  04/12/17 167 lb 6.4 oz (75.9 kg)  09/01/16 160 lb (72.6 kg)     PHYSICAL EXAM General: NAD HEENT: Normal. Neck: No JVD, no thyromegaly. Lungs: Clear to auscultation bilaterally with normal respiratory effort. CV: Regular rate and irregular rhythm, normal S1/S2, no S3, no murmur. No pretibial or periankle edema.  No carotid bruit.   Abdomen: Soft, nontender, no distention.  Neurologic: Alert and oriented.  Psych: Normal affect. Skin: Normal. Musculoskeletal: No gross deformities.    ECG: Reviewed above under Subjective   Labs: Lab Results  Component Value Date/Time   K 4.4 04/14/2017 08:10 AM   BUN 16 04/14/2017 08:10 AM   CREATININE 0.65 04/14/2017 08:10 AM    CREATININE 0.60 10/26/2012 09:55 AM   ALT 21 04/21/2016 10:21 AM   TSH 1.471 11/08/2015 08:40 AM   HGB 13.0 04/14/2017 08:10 AM     Lipids: Lab Results  Component Value Date/Time   LDLCALC 63 04/14/2017 08:10 AM   CHOL 148 04/14/2017 08:10 AM   TRIG 40 04/14/2017 08:10 AM   HDL 77 04/14/2017 08:10 AM       ASSESSMENT AND PLAN: 1.  Permanent atrial fibrillation: Symptomatically stable.  Continue long-acting diltiazem 300 mg daily.  Systemically anticoagulated with apixaban.  No changes to therapy.  I will check a CBC and basic metabolic panel.  2.  Hypertension: Blood pressure is normal.  No changes to therapy.  3.  Hyperlipidemia: Lipid panel from February 2019 reviewed above with all values at goal.  Continue atorvastatin 40 mg.  I will repeat lipids.  4.  History of anemia: Hemoglobin was normal in February 2019.  I will repeat a CBC as per her request.   Disposition: Follow up 1 year.   Kate Sable, M.D., F.A.C.C.

## 2018-04-12 NOTE — Patient Instructions (Signed)
Medication Instructions:  Your physician recommends that you continue on your current medications as directed. Please refer to the Current Medication list given to you today.  If you need a refill on your cardiac medications before your next appointment, please call your pharmacy.   Lab work: CBC,BMET, Lipids If you have labs (blood work) drawn today and your tests are completely normal, you will receive your results only by: Marland Kitchen MyChart Message (if you have MyChart) OR . A paper copy in the mail If you have any lab test that is abnormal or we need to change your treatment, we will call you to review the results.  Testing/Procedures: None today  Follow-Up: At [facility], you and your health needs are our priority.  As part of our continuing mission to provide you with exceptional heart care, we have created designated Provider Care Teams.  These Care Teams include your primary Cardiologist (physician) and Advanced Practice Providers (APPs -  Physician Assistants and Nurse Practitioners) who all work together to provide you with the care you need, when you need it. You will need a follow up appointment in 12 months.  Please call our office 2 months in advance to schedule this appointment.  You may see Kate Sable, MD or one of the following Advanced Practice Providers on your designated Care Team:   Bernerd Pho, PA-C [facility]) . Ermalinda Barrios, PA-C ([place])  Any Other Special Instructions Will Be Listed Below (If Applicable). None

## 2018-05-13 ENCOUNTER — Other Ambulatory Visit: Payer: Self-pay | Admitting: Cardiovascular Disease

## 2018-07-04 ENCOUNTER — Telehealth: Payer: Self-pay

## 2018-07-04 NOTE — Telephone Encounter (Signed)
Called [employer] because pt is having problems getting her Cardizem CD 300 mg filled. They request a different medication. She has not tried and failed others so they cannot do an exemption. Please advise on another medication.

## 2018-07-05 NOTE — Telephone Encounter (Signed)
Will they do verapamil (extended release, same dose)?

## 2018-07-06 ENCOUNTER — Telehealth: Payer: Self-pay

## 2018-07-06 MED ORDER — VERAPAMIL HCL ER 300 MG PO CP24: 300.0000 mg | ORAL_CAPSULE | Freq: Every day | ORAL | 3 refills | Status: DC

## 2018-07-06 NOTE — Telephone Encounter (Signed)
[facility]

## 2018-07-06 NOTE — Addendum Note (Signed)
Addended by: Debbora Lacrosse R on: 07/06/2018 08:02 AM   Modules accepted: Orders

## 2018-07-06 NOTE — Telephone Encounter (Signed)
I sent it in, we will see if they  kick it back.

## 2018-07-09 ENCOUNTER — Telehealth: Payer: Self-pay | Admitting: Cardiovascular Disease

## 2018-07-09 NOTE — Telephone Encounter (Signed)
Patient called stating that she has not received Verapamil 300 mg. States that she is going to run out this weekend.  Please call [phone].

## 2018-07-09 NOTE — Telephone Encounter (Signed)
Spoke with pt and she received verapamil today in the mail. Refill was sent on 07/06/18 to [facility]

## 2018-07-31 ENCOUNTER — Telehealth: Payer: Self-pay | Admitting: Cardiovascular Disease

## 2018-07-31 MED ORDER — DILTIAZEM HCL ER COATED BEADS 300 MG PO CP24: 300.0000 mg | ORAL_CAPSULE | Freq: Every day | ORAL | 3 refills | Status: DC

## 2018-07-31 MED ORDER — ENALAPRIL MALEATE 20 MG PO TABS: 20.0000 mg | ORAL_TABLET | Freq: Every day | ORAL | 3 refills | Status: DC

## 2018-07-31 NOTE — Telephone Encounter (Signed)
Please give pt a phone call-- states she's needing someone to go over her medications w/ her for her insurance.

## 2018-07-31 NOTE — Telephone Encounter (Signed)
Refill completed.

## 2018-07-31 NOTE — Telephone Encounter (Signed)
Pt called and states that she would to switch from Verapamil back to Diltiazem. She states that her insurance will pay for the Diltiazem. Please advise

## 2018-07-31 NOTE — Telephone Encounter (Signed)
That would be fine. Can go back on previous dose.

## 2018-08-31 ENCOUNTER — Other Ambulatory Visit: Payer: Self-pay

## 2018-08-31 ENCOUNTER — Other Ambulatory Visit: Payer: Self-pay | Admitting: *Deleted

## 2018-08-31 MED ORDER — HYDROCHLOROTHIAZIDE 25 MG PO TABS: 25.0000 mg | ORAL_TABLET | Freq: Every morning | ORAL | 3 refills | Status: DC

## 2018-08-31 MED ORDER — ATORVASTATIN CALCIUM 40 MG PO TABS: ORAL_TABLET | ORAL | 3 refills | Status: DC

## 2018-08-31 NOTE — Telephone Encounter (Signed)
Refilled hctz to [facility] order

## 2018-10-23 ENCOUNTER — Encounter: Payer: Self-pay | Admitting: Family Medicine

## 2018-10-23 DIAGNOSIS — Z1231 Encounter for screening mammogram for malignant neoplasm of breast: Secondary | ICD-10-CM | POA: Diagnosis not present

## 2018-10-23 DIAGNOSIS — R69 Illness, unspecified: Secondary | ICD-10-CM | POA: Diagnosis not present

## 2018-10-25 ENCOUNTER — Encounter: Payer: Self-pay | Admitting: Family Medicine

## 2018-12-29 ENCOUNTER — Other Ambulatory Visit: Payer: Self-pay | Admitting: Cardiovascular Disease

## 2019-01-12 DIAGNOSIS — R69 Illness, unspecified: Secondary | ICD-10-CM | POA: Diagnosis not present

## 2019-04-09 DIAGNOSIS — D3131 Benign neoplasm of right choroid: Secondary | ICD-10-CM | POA: Diagnosis not present

## 2019-04-11 ENCOUNTER — Encounter: Payer: Self-pay | Admitting: Family Medicine

## 2019-04-23 ENCOUNTER — Ambulatory Visit (INDEPENDENT_AMBULATORY_CARE_PROVIDER_SITE_OTHER): Payer: Medicare HMO | Admitting: Cardiovascular Disease

## 2019-04-23 ENCOUNTER — Telehealth: Payer: Self-pay | Admitting: Family Medicine

## 2019-04-23 ENCOUNTER — Encounter: Payer: Self-pay | Admitting: Cardiovascular Disease

## 2019-04-23 VITALS — Ht 65.0 in | Wt 170.0 lb

## 2019-04-23 DIAGNOSIS — D649 Anemia, unspecified: Secondary | ICD-10-CM | POA: Diagnosis not present

## 2019-04-23 DIAGNOSIS — I10 Essential (primary) hypertension: Secondary | ICD-10-CM

## 2019-04-23 DIAGNOSIS — E7800 Pure hypercholesterolemia, unspecified: Secondary | ICD-10-CM | POA: Diagnosis not present

## 2019-04-23 DIAGNOSIS — I4821 Permanent atrial fibrillation: Secondary | ICD-10-CM | POA: Diagnosis not present

## 2019-04-23 DIAGNOSIS — R7301 Impaired fasting glucose: Secondary | ICD-10-CM

## 2019-04-23 DIAGNOSIS — E785 Hyperlipidemia, unspecified: Secondary | ICD-10-CM

## 2019-04-23 NOTE — Patient Instructions (Signed)
Medication Instructions:  Your physician recommends that you continue on your current medications as directed. Please refer to the Current Medication list given to you today.  *If you need a refill on your cardiac medications before your next appointment, please call your pharmacy*  Lab Work: None today If you have labs (blood work) drawn today and your tests are completely normal, you will receive your results only by: . MyChart Message (if you have MyChart) OR . A paper copy in the mail If you have any lab test that is abnormal or we need to change your treatment, we will call you to review the results.  Testing/Procedures: None today  Follow-Up: At [facility], you and your health needs are our priority.  As part of our continuing mission to provide you with exceptional heart care, we have created designated Provider Care Teams.  These Care Teams include your primary Cardiologist (physician) and Advanced Practice Providers (APPs -  Physician Assistants and Nurse Practitioners) who all work together to provide you with the care you need, when you need it.  Your next appointment:   12 months  The format for your next appointment:   In Person  Provider:   Suresh Koneswaran, MD  Other Instructions None      Thank you for choosing [facility] !         

## 2019-04-23 NOTE — Telephone Encounter (Signed)
Last lab 04/2018: Lipid, CBC, Met 7

## 2019-04-23 NOTE — Telephone Encounter (Signed)
Pt has CPE on 3/3 and would like to have lab work done before appt.

## 2019-04-23 NOTE — Telephone Encounter (Signed)
Blood work ordered in Epic. Telephone call-mailbox is full 

## 2019-04-23 NOTE — Telephone Encounter (Signed)
Same plus liv

## 2019-04-23 NOTE — Progress Notes (Signed)
Virtual Visit via Telephone Note   This visit type was conducted due to national recommendations for restrictions regarding the COVID-19 Pandemic (e.g. social distancing) in an effort to limit this patient's exposure and mitigate transmission in our community.  Due to her co-morbid illnesses, this patient is at least at moderate risk for complications without adequate follow up.  This format is felt to be most appropriate for this patient at this time.  The patient did not have access to video technology/had technical difficulties with video requiring transitioning to audio format only (telephone).  All issues noted in this document were discussed and addressed.  No physical exam could be performed with this format.  Please refer to the patient's chart for her  consent to telehealth for [facility].   Date:  04/23/2019   ID:  Sara Terry, DOB [birth year], MRN [id number]  Patient Location: Home Provider Location: Office  PCP:  Mikey Kirschner, MD  Cardiologist:  Kate Sable, MD  Electrophysiologist:  None   Evaluation Performed:  Follow-Up Visit  Chief Complaint: Atrial fibrillation  History of Present Illness:    Sara Terry is a 74 y.o. female with permanent atrial fibrillation.  The patient denies any symptoms of chest pain, palpitations, shortness of breath, lightheadedness, dizziness, leg swelling, orthopnea, PND, and syncope.  She spends her time reading books, writing cards, and occasional going to the [facility] in [place].  Soc Hx: Her husband is also my patient. Her son is an Metallurgist and lives in [place].  She also has a daughter.  Past Medical History:  Diagnosis Date  . Arthritis   . Atrial fibrillation ([place])   . GERD (gastroesophageal reflux disease)   . Glucose intolerance (impaired glucose tolerance)   . Hypertension   . Insomnia   . Raynaud phenomenon   . Reflux   . Skin cancer    Forehead   Past Surgical History:    Procedure Laterality Date  . ABDOMINOPLASTY    . BREAST REDUCTION SURGERY  2003  . COLONOSCOPY    . COLONOSCOPY N/A 06/13/2013   Procedure: COLONOSCOPY;  Surgeon: Rogene Houston, MD;  Location[facility];  Service: Endoscopy;  Laterality: N/A;  930  . FOOT SURGERY Left 02/09/2016  . TONSILLECTOMY    . TOTAL HIP ARTHROPLASTY Right 04/27/2016   Procedure: RIGHT TOTAL HIP ARTHROPLASTY ANTERIOR APPROACH;  Surgeon: Gaynelle Arabian, MD;  Location: [facility] ORS;  Service: Orthopedics;  Laterality: Right;  . TUBAL LIGATION       Current Meds  Medication Sig  . acetaminophen (TYLENOL) 500 MG tablet Take 500 mg by mouth. Patient takes 3 tylenol arthritis two times daily  . atorvastatin (LIPITOR) 40 MG tablet TAKE 1 TABLET DAILY AT 6PM  . calcium carbonate (OS-CAL - DOSED IN MG OF ELEMENTAL CALCIUM) 1250 (500 Ca) MG tablet Take 1 tablet by mouth 2 (two) times daily with a meal.   . diltiazem (CARDIZEM CD) 300 MG 24 hr capsule Take 1 capsule (300 mg total) by mouth daily.  Marland Kitchen docusate sodium (COLACE) 100 MG capsule Take 100 mg by mouth daily.  Marland Kitchen ELIQUIS 5 MG TABS tablet TAKE 1 TABLET TWICE A DAY  . enalapril (VASOTEC) 20 MG tablet Take 1 tablet (20 mg total) by mouth daily.  . hydrochlorothiazide (HYDRODIURIL) 25 MG tablet Take 1 tablet (25 mg total) by mouth every morning.  . pantoprazole (PROTONIX) 40 MG tablet TAKE (1) TABLET BY MOUTH EACH MORNING. (Patient taking differently: Take 40  mg by mouth daily as needed. TAKE (1) TABLET BY MOUTH EACH MORNING.)  . zolpidem (AMBIEN) 10 MG tablet Take 1 tablet (10 mg total) by mouth at bedtime as needed for sleep.     Allergies:   Maxzide [triamterene-hctz] and Penicillins   Social History   Tobacco Use  . Smoking status: Former Smoker    Start date: 03/07/1964    Quit date: 03/07/1978    Years since quitting: 41.1  . Smokeless tobacco: Never Used  Substance Use Topics  . Alcohol use: Yes    Comment: 2.5 glasses daily   . Drug use: No     Family  Hx: The patient's family history includes Diabetes in her father; Heart attack in her father; Hypertension in her brother, father, and mother.  ROS:   Please see the history of present illness.     All other systems reviewed and are negative.   Prior CV studies:   The following studies were reviewed today:  Echo 11/10/15:  - Left ventricle: The cavity size was normal. Wall thickness was  normal. Systolic function was normal. The estimated ejection  fraction was in the range of 60% to 65%. Wall motion was normal;  there were no regional wall motion abnormalities. The study is  not technically sufficient to allow evaluation of LV diastolic  function.  - Mitral valve: Mildly calcified annulus. There was moderate  regurgitation.  - Left atrium: The atrium was moderately dilated.  - Right ventricle: Systolic function was mildly reduced.  - Right atrium: The atrium was moderately dilated.  - Tricuspid valve: There was moderate regurgitation.   Labs/Other Tests and Data Reviewed:    EKG:  No ECG reviewed.  Recent Labs: No results found for requested labs within last 8760 hours.   Recent Lipid Panel Lab Results  Component Value Date/Time   CHOL 164 04/12/2018 09:43 AM   CHOL 148 04/14/2017 08:10 AM   TRIG 35 04/12/2018 09:43 AM   HDL 73 04/12/2018 09:43 AM   HDL 77 04/14/2017 08:10 AM   CHOLHDL 2.2 04/12/2018 09:43 AM   LDLCALC 84 04/12/2018 09:43 AM   LDLCALC 63 04/14/2017 08:10 AM    Wt Readings from Last 3 Encounters:  04/23/19 170 lb (77.1 kg)  04/12/18 169 lb 12.8 oz (77 kg)  04/12/17 167 lb 6.4 oz (75.9 kg)     Objective:    Vital Signs:  Ht 5\' 5"  (1.651 m)   Wt 170 lb (77.1 kg)   BMI 28.29 kg/m    VITAL SIGNS:  reviewed  ASSESSMENT & PLAN:    1.  Permanent atrial fibrillation: Symptomatically stable.  Continue long-acting diltiazem 300 mg daily.  Systemically anticoagulated with apixaban.  No changes to therapy.    2.  Hypertension: No  changes to therapy.  3.  Hyperlipidemia: Lipids reviewed above.  Continue statin therapy.   4.  History of anemia: Hemoglobin was normal in February 2020.     COVID-19 Education: The signs and symptoms of COVID-19 were discussed with the patient and how to seek care for testing (follow up with PCP or arrange E-visit).  The importance of social distancing was discussed today.  Time:   Today, I have spent 10 minutes with the patient with telehealth technology discussing the above problems.     Medication Adjustments/Labs and Tests Ordered: Current medicines are reviewed at length with the patient today.  Concerns regarding medicines are outlined above.   Tests Ordered: No orders of the defined types  were placed in this encounter.   Medication Changes: No orders of the defined types were placed in this encounter.   Follow Up:  In Person in 1 year(s)  Signed, Kate Sable, MD  04/23/2019 10:11 AM    [place]

## 2019-04-29 DIAGNOSIS — R69 Illness, unspecified: Secondary | ICD-10-CM | POA: Diagnosis not present

## 2019-04-29 NOTE — Telephone Encounter (Signed)
Pt.notified

## 2019-05-01 DIAGNOSIS — R7301 Impaired fasting glucose: Secondary | ICD-10-CM | POA: Diagnosis not present

## 2019-05-01 DIAGNOSIS — I10 Essential (primary) hypertension: Secondary | ICD-10-CM | POA: Diagnosis not present

## 2019-05-01 DIAGNOSIS — E785 Hyperlipidemia, unspecified: Secondary | ICD-10-CM | POA: Diagnosis not present

## 2019-05-02 LAB — CBC WITH DIFFERENTIAL/PLATELET
Basophils Absolute: 0 10*3/uL (ref 0.0–0.2)
Basos: 1 %
EOS (ABSOLUTE): 0.1 10*3/uL (ref 0.0–0.4)
Eos: 3 %
Hematocrit: 38.8 % (ref 34.0–46.6)
Hemoglobin: 14 g/dL (ref 11.1–15.9)
Immature Grans (Abs): 0 10*3/uL (ref 0.0–0.1)
Immature Granulocytes: 0 %
Lymphocytes Absolute: 1.7 10*3/uL (ref 0.7–3.1)
Lymphs: 39 %
MCH: 33.9 pg — ABNORMAL HIGH (ref 26.6–33.0)
MCHC: 36.1 g/dL — ABNORMAL HIGH (ref 31.5–35.7)
MCV: 94 fL (ref 79–97)
Monocytes Absolute: 0.5 10*3/uL (ref 0.1–0.9)
Monocytes: 11 %
Neutrophils Absolute: 2 10*3/uL (ref 1.4–7.0)
Neutrophils: 46 %
Platelets: 283 10*3/uL (ref 150–450)
RBC: 4.13 x10E6/uL (ref 3.77–5.28)
RDW: 11.6 % — ABNORMAL LOW (ref 11.7–15.4)
WBC: 4.3 10*3/uL (ref 3.4–10.8)

## 2019-05-02 LAB — BASIC METABOLIC PANEL
BUN/Creatinine Ratio: 21 (ref 12–28)
BUN: 13 mg/dL (ref 8–27)
CO2: 23 mmol/L (ref 20–29)
Calcium: 9.2 mg/dL (ref 8.7–10.3)
Chloride: 99 mmol/L (ref 96–106)
Creatinine, Ser: 0.61 mg/dL (ref 0.57–1.00)
GFR calc Af Amer: 105 mL/min/{1.73_m2} (ref >59)
GFR calc non Af Amer: 91 mL/min/{1.73_m2} (ref >59)
Glucose: 117 mg/dL — ABNORMAL HIGH (ref 65–99)
Potassium: 4.5 mmol/L (ref 3.5–5.2)
Sodium: 138 mmol/L (ref 134–144)

## 2019-05-02 LAB — HEPATIC FUNCTION PANEL
ALT: 13 IU/L (ref 0–32)
AST: 16 IU/L (ref 0–40)
Albumin: 4.3 g/dL (ref 3.7–4.7)
Alkaline Phosphatase: 73 IU/L (ref 39–117)
Bilirubin Total: 0.4 mg/dL (ref 0.0–1.2)
Bilirubin, Direct: 0.15 mg/dL (ref 0.00–0.40)
Total Protein: 6.2 g/dL (ref 6.0–8.5)

## 2019-05-02 LAB — LIPID PANEL
Chol/HDL Ratio: 2 ratio (ref 0.0–4.4)
Cholesterol, Total: 157 mg/dL (ref 100–199)
HDL: 79 mg/dL (ref >39)
LDL Chol Calc (NIH): 67 mg/dL (ref 0–99)
Triglycerides: 52 mg/dL (ref 0–149)
VLDL Cholesterol Cal: 11 mg/dL (ref 5–40)

## 2019-05-08 ENCOUNTER — Other Ambulatory Visit: Payer: Self-pay

## 2019-05-08 ENCOUNTER — Ambulatory Visit (INDEPENDENT_AMBULATORY_CARE_PROVIDER_SITE_OTHER): Payer: Medicare HMO | Admitting: Family Medicine

## 2019-05-08 ENCOUNTER — Encounter: Payer: Self-pay | Admitting: Family Medicine

## 2019-05-08 VITALS — BP 134/88 | Temp 96.9°F | Ht 65.0 in | Wt 179.0 lb

## 2019-05-08 DIAGNOSIS — Z13820 Encounter for screening for osteoporosis: Secondary | ICD-10-CM | POA: Diagnosis not present

## 2019-05-08 DIAGNOSIS — Z0000 Encounter for general adult medical examination without abnormal findings: Secondary | ICD-10-CM | POA: Diagnosis not present

## 2019-05-08 MED ORDER — SHINGRIX 50 MCG/0.5ML IM SUSR: 0.5000 mL | Freq: Once | INTRAMUSCULAR | 1 refills | Status: AC

## 2019-05-08 NOTE — Progress Notes (Signed)
Subjective:    Patient ID: Sara Terry, female    DOB: [birth year], 74 y.o.   MRN: [id number]  HPI AWV- Annual Wellness Visit  The patient was seen for their annual wellness visit. The patient's past medical history, surgical history, and family history were reviewed. Pertinent vaccines were reviewed ( tetanus, pneumonia, shingles, flu) The patient's medication list was reviewed and updated.  The height and weight were entered.  BMI recorded in electronic record elsewhere  Cognitive screening was completed. Outcome of Mini - Cog: pass   Falls /depression screening electronically recorded within record elsewhere  Current tobacco usage:none (All patients who use tobacco were given written and verbal information on quitting)  Recent listing of emergency department/hospitalizations over the past year were reviewed.  current specialist the patient sees on a regular basis: Cardiologist -phone visit.    Medicare annual wellness visit patient questionnaire was reviewed.  A written screening schedule for the patient for the next 5-10 years was given. Appropriate discussion of followup regarding next visit was discussed.  Results for orders placed or performed in visit on 04/23/19  Lipid panel  Result Value Ref Range   Cholesterol, Total 157 100 - 199 mg/dL   Triglycerides 52 0 - 149 mg/dL   HDL 79 >39 mg/dL   VLDL Cholesterol Cal 11 5 - 40 mg/dL   LDL Chol Calc (NIH) 67 0 - 99 mg/dL   Chol/HDL Ratio 2.0 0.0 - 4.4 ratio  Hepatic function panel  Result Value Ref Range   Total Protein 6.2 6.0 - 8.5 g/dL   Albumin 4.3 3.7 - 4.7 g/dL   Bilirubin Total 0.4 0.0 - 1.2 mg/dL   Bilirubin, Direct 0.15 0.00 - 0.40 mg/dL   Alkaline Phosphatase 73 39 - 117 IU/L   AST 16 0 - 40 IU/L   ALT 13 0 - 32 IU/L  CBC with Differential/Platelet  Result Value Ref Range   WBC 4.3 3.4 - 10.8 x10E3/uL   RBC 4.13 3.77 - 5.28 x10E6/uL   Hemoglobin 14.0 11.1 - 15.9 g/dL   Hematocrit 38.8 34.0 - 46.6  %   MCV 94 79 - 97 fL   MCH 33.9 (H) 26.6 - 33.0 pg   MCHC 36.1 (H) 31.5 - 35.7 g/dL   RDW 11.6 (L) 11.7 - 15.4 %   Platelets 283 150 - 450 x10E3/uL   Neutrophils 46 Not Estab. %   Lymphs 39 Not Estab. %   Monocytes 11 Not Estab. %   Eos 3 Not Estab. %   Basos 1 Not Estab. %   Neutrophils Absolute 2.0 1.4 - 7.0 x10E3/uL   Lymphocytes Absolute 1.7 0.7 - 3.1 x10E3/uL   Monocytes Absolute 0.5 0.1 - 0.9 x10E3/uL   EOS (ABSOLUTE) 0.1 0.0 - 0.4 x10E3/uL   Basophils Absolute 0.0 0.0 - 0.2 x10E3/uL   Immature Granulocytes 0 Not Estab. %   Immature Grans (Abs) 0.0 0.0 - 0.1 [id number]  Basic metabolic panel  Result Value Ref Range   Glucose 117 (H) 65 - 99 mg/dL   BUN 13 8 - 27 mg/dL   Creatinine, Ser 0.61 0.57 - 1.00 mg/dL   GFR calc non Af Amer 91 >59 mL/min/1.73   GFR calc Af Amer 105 >59 mL/min/1.73   BUN/Creatinine Ratio 21 12 - 28   Sodium 138 134 - 144 mmol/L   Potassium 4.5 3.5 - 5.2 mmol/L   Chloride 99 96 - 106 mmol/L   CO2 23 20 - 29 mmol/L   Calcium  9.2 8.7 - 10.3 mg/dL   Got the first shot   alking an water aeirobics      Review of Systems No headache, no major weight loss or weight gain, no chest pain no back pain abdominal pain no change in bowel habits complete ROS otherwise negative     Objective:   Physical Exam  Alert active.  Lungs clear.  Heart irregular rhythm however under good control.  Blood pressure excellent on repeat HEENT is within normal limits.      Assessment & Plan:  Impression wellness exam diet discussed.  Exercise discussed.  Vaccines discussed.  Needs a Prevnar injection.  Needs Shingrix x2.  Needs bone density test rationale discussed.  No need for Pap smears or pelvic exams going forward.  Strongly encourage ongoing mammograms discussed.  Net colon due in five yrs, nov 7 fl shot \\no  hx of pap smear abnormality   Has stayed with mmmo pn reg basos

## 2019-05-08 NOTE — Patient Instructions (Signed)

## 2019-05-09 NOTE — Addendum Note (Signed)
Addended by: Vicente Males on: 05/09/2019 11:10 AM   Modules accepted: Orders

## 2019-05-15 ENCOUNTER — Other Ambulatory Visit: Payer: Self-pay

## 2019-05-15 ENCOUNTER — Ambulatory Visit (HOSPITAL_COMMUNITY)
Admission: RE | Admit: 2019-05-15 | Discharge: 2019-05-15 | Disposition: A | Discharge status: Home / Self Care | Payer: Medicare HMO | Source: Ambulatory Visit | Attending: Family Medicine | Admitting: Family Medicine

## 2019-05-15 DIAGNOSIS — Z13820 Encounter for screening for osteoporosis: Secondary | ICD-10-CM | POA: Insufficient documentation

## 2019-05-15 DIAGNOSIS — Z780 Asymptomatic menopausal state: Secondary | ICD-10-CM | POA: Insufficient documentation

## 2019-05-17 DIAGNOSIS — Z96641 Presence of right artificial hip joint: Secondary | ICD-10-CM | POA: Diagnosis not present

## 2019-05-17 DIAGNOSIS — M25561 Pain in right knee: Secondary | ICD-10-CM | POA: Diagnosis not present

## 2019-06-12 ENCOUNTER — Other Ambulatory Visit (INDEPENDENT_AMBULATORY_CARE_PROVIDER_SITE_OTHER): Payer: Medicare HMO | Admitting: *Deleted

## 2019-06-12 ENCOUNTER — Other Ambulatory Visit: Payer: Self-pay

## 2019-06-12 DIAGNOSIS — Z23 Encounter for immunization: Secondary | ICD-10-CM

## 2019-06-18 ENCOUNTER — Encounter: Payer: Self-pay | Admitting: Family Medicine

## 2019-06-20 ENCOUNTER — Other Ambulatory Visit: Payer: Self-pay

## 2019-06-20 ENCOUNTER — Encounter: Payer: Self-pay | Admitting: Podiatry

## 2019-06-20 ENCOUNTER — Ambulatory Visit (INDEPENDENT_AMBULATORY_CARE_PROVIDER_SITE_OTHER): Payer: Medicare HMO | Admitting: Podiatry

## 2019-06-20 VITALS — Temp 96.8°F

## 2019-06-20 DIAGNOSIS — Q828 Other specified congenital malformations of skin: Secondary | ICD-10-CM

## 2019-06-20 NOTE — Progress Notes (Signed)
Subjective:   Patient ID: Sara Terry, female   DOB: 74 y.o.   MRN: [id number]   HPI Patient presents with lesion underneath the left foot which has become very painful and made it hard to walk   ROS      Objective:  Physical Exam  Neurovascular status intact with keratotic lesion subsecond metatarsal left with lucent core       Assessment:  Porokeratotic lesion of the left second metatarsal     Plan:  Sterile sharp debridement accomplished today with sterile dressing applied and will be seen back if symptoms indicate

## 2019-07-18 ENCOUNTER — Other Ambulatory Visit: Payer: Self-pay | Admitting: Cardiovascular Disease

## 2019-09-11 ENCOUNTER — Other Ambulatory Visit: Payer: Self-pay | Admitting: Cardiovascular Disease

## 2019-10-06 ENCOUNTER — Other Ambulatory Visit: Payer: Self-pay

## 2019-10-06 ENCOUNTER — Encounter: Payer: Self-pay | Admitting: Emergency Medicine

## 2019-10-06 ENCOUNTER — Ambulatory Visit
Admission: EM | Admit: 2019-10-06 | Discharge: 2019-10-06 | Disposition: A | Discharge status: Home / Self Care | Payer: Medicare HMO | Attending: Family Medicine | Admitting: Family Medicine

## 2019-10-06 DIAGNOSIS — R05 Cough: Secondary | ICD-10-CM | POA: Diagnosis not present

## 2019-10-06 DIAGNOSIS — Z1152 Encounter for screening for COVID-19: Secondary | ICD-10-CM

## 2019-10-06 DIAGNOSIS — R0981 Nasal congestion: Secondary | ICD-10-CM | POA: Diagnosis not present

## 2019-10-06 DIAGNOSIS — R059 Cough, unspecified: Secondary | ICD-10-CM

## 2019-10-06 NOTE — Discharge Instructions (Addendum)
Your COVID test is pending.  You should self quarantine until the test result is back.    Take Tylenol as needed for fever or discomfort.  Rest and keep yourself hydrated.    Go to the emergency department if you develop acute worsening symptoms.     

## 2019-10-06 NOTE — ED Provider Notes (Signed)
[place]   [id number] 10/06/19 Arrival Time: 1054   CC: COVID symptoms  SUBJECTIVE: History from: patient.  Sara Terry is a 74 y.o. female who presents with abrupt onset of nasal congestion, PND, and persistent dry cough for the last 3 days. Denies sick exposure to COVID, flu or strep. No hx Covid. Has completed Covid vaccines. Denies recent travel. Has taken Nyquil with some relief. There are no aggravating symptoms. Denies previous symptoms in the past. Denies fever, chills, fatigue, sinus pain, rhinorrhea, sore throat, SOB, wheezing, chest pain, nausea, changes in bowel or bladder habits.    ROS: As per HPI.  All other pertinent ROS negative.     Past Medical History:  Diagnosis Date   Arthritis    Atrial fibrillation (HCC)    GERD (gastroesophageal reflux disease)    Glucose intolerance (impaired glucose tolerance)    Hypertension    Insomnia    Raynaud phenomenon    Reflux    Skin cancer    Forehead   Past Surgical History:  Procedure Laterality Date   ABDOMINOPLASTY     BREAST REDUCTION SURGERY  2003   COLONOSCOPY     COLONOSCOPY N/A 06/13/2013   Procedure: COLONOSCOPY;  Surgeon: Rogene Houston, MD;  Location[facility];  Service: Endoscopy;  Laterality: N/A;  930   FOOT SURGERY Left 02/09/2016   TONSILLECTOMY     TOTAL HIP ARTHROPLASTY Right 04/27/2016   Procedure: RIGHT TOTAL HIP ARTHROPLASTY ANTERIOR APPROACH;  Surgeon: Gaynelle Arabian, MD;  Location: [facility];  Service: Orthopedics;  Laterality: Right;   TUBAL LIGATION     Allergies  Allergen Reactions   Maxzide [Triamterene-Hctz]    Penicillins Hives    Has patient had a PCN reaction causing immediate rash, facial/tongue/throat swelling, SOB or lightheadedness with hypotension: Yes Has patient had a PCN reaction causing severe rash involving mucus membranes or skin necrosis: No Has patient had a PCN reaction that required hospitalization No Has patient had a PCN reaction  occurring within the last 10 years: No If all of the above answers are "NO", then may proceed with Cephalosporin use.    No current facility-administered medications on file prior to encounter.   Current Outpatient Medications on File Prior to Encounter  Medication Sig Dispense Refill   atorvastatin (LIPITOR) 40 MG tablet TAKE 1 TABLET DAILY AT 6PM 90 tablet 3   calcium carbonate (OS-CAL - DOSED IN MG OF ELEMENTAL CALCIUM) 1250 (500 Ca) MG tablet Take 1 tablet by mouth 2 (two) times daily with a meal.      diltiazem (CARDIZEM CD) 300 MG 24 hr capsule TAKE 1 CAPSULE DAILY 90 capsule 3   docusate sodium (COLACE) 100 MG capsule Take 100 mg by mouth daily.     ELIQUIS 5 MG TABS tablet TAKE 1 TABLET TWICE A DAY 180 tablet 3   enalapril (VASOTEC) 20 MG tablet TAKE 1 TABLET DAILY 90 tablet 3   hydrochlorothiazide (HYDRODIURIL) 25 MG tablet TAKE 1 TABLET EVERY MORNING 90 tablet 3   zolpidem (AMBIEN) 10 MG tablet Take 1 tablet (10 mg total) by mouth at bedtime as needed for sleep. 30 tablet 1   Social History   Socioeconomic History   Marital status: Married    Spouse name: Not on file   Number of children: Not on file   Years of education: Not on file   Highest education level: Not on file  Occupational History   Not on file  Tobacco Use  Smoking status: Former Smoker    Start date: 03/07/1964    Quit date: 03/07/1978    Years since quitting: 41.6   Smokeless tobacco: Never Used  Vaping Use   Vaping Use: Never used  Substance and Sexual Activity   Alcohol use: Yes    Comment: 2.5 glasses daily    Drug use: No   Sexual activity: Not on file  Other Topics Concern   Not on file  Social History Narrative   Not on file   Social Determinants of Health   Financial Resource Strain:    Difficulty of Paying Living Expenses:   Food Insecurity:    Worried About Charity fundraiser in the Last Year:    Arboriculturist in the Last Year:   Transportation Needs:     Film/video editor (Medical):    Lack of Transportation (Non-Medical):   Physical Activity:    Days of Exercise per Week:    Minutes of Exercise per Session:   Stress:    Feeling of Stress :   Social Connections:    Frequency of Communication with Friends and Family:    Frequency of Social Gatherings with Friends and Family:    Attends Religious Services:    Active Member of Clubs or Organizations:    Attends Music therapist:    Marital Status:   Intimate Partner Violence:    Fear of Current or Ex-Partner:    Emotionally Abused:    Physically Abused:    Sexually Abused:    Family History  Problem Relation Age of Onset   Hypertension Mother    Hypertension Father    Diabetes Father    Heart attack Father    Hypertension Brother     OBJECTIVE:  Vitals:   10/06/19 1101 10/06/19 1102  BP:  (!) 156/98  Pulse:  (!) 114  Resp:  18  Temp:  98.6 F (37 C)  TempSrc:  Oral  SpO2:  96%  Weight: 177 lb (80.3 kg)   Height: 5\' 5"  (1.651 m)      General appearance: alert; appears fatigued, but nontoxic; speaking in full sentences and tolerating own secretions HEENT: NCAT; Ears: EACs clear, TMs pearly gray; Eyes: PERRL.  EOM grossly intact. Sinuses: nontender; Nose: nares patent without rhinorrhea, Throat: oropharynx clear, tonsils non erythematous or enlarged, uvula midline  Neck: supple without LAD Lungs: unlabored respirations, symmetrical air entry; cough: mild; no respiratory distress; CTAB Heart: regular rate and rhythm.  Radial pulses 2+ symmetrical bilaterally Skin: warm and dry Psychological: alert and cooperative; normal mood and affect  LABS:  No results found for this or any previous visit (from the past 24 hour(s)).   ASSESSMENT & PLAN:  1. Cough   2. Nasal congestion   3. Encounter for screening for COVID-19     May take mucinex for congestion May continue with nyquil at night for symptoms relief  COVID testing  ordered.  It will take between 1-2 days for test results.  Someone will contact you regarding abnormal results.    Patient should remain in quarantine until they have received Covid results.  If negative you may resume normal activities (go back to work/school) while practicing hand hygiene, social distance, and mask wearing.  If positive, patient should remain in quarantine for 10 days from symptom onset AND greater than 72 hours after symptoms resolution (absence of fever without the use of fever-reducing medication and improvement in respiratory symptoms), whichever is longer Get plenty  of rest and push fluids Use OTC zyrtec for nasal congestion, runny nose, and/or sore throat Use OTC flonase for nasal congestion and runny nose Use medications daily for symptom relief Use OTC medications like ibuprofen or tylenol as needed fever or pain Call or go to the ED if you have any new or worsening symptoms such as fever, worsening cough, shortness of breath, chest tightness, chest pain, turning blue, changes in mental status.  Reviewed expectations re: course of current medical issues. Questions answered. Outlined signs and symptoms indicating need for more acute intervention. Patient verbalized understanding. After Visit Summary given.         Faustino Congress, NP 10/06/19 1119

## 2019-10-06 NOTE — ED Triage Notes (Signed)
Cough and fatigue x 3 days

## 2019-10-08 LAB — NOVEL CORONAVIRUS, NAA: SARS-CoV-2, NAA: NOT DETECTED

## 2019-10-08 LAB — SARS-COV-2, NAA 2 DAY TAT

## 2019-10-31 DIAGNOSIS — Z1231 Encounter for screening mammogram for malignant neoplasm of breast: Secondary | ICD-10-CM | POA: Diagnosis not present

## 2019-11-12 DIAGNOSIS — R69 Illness, unspecified: Secondary | ICD-10-CM | POA: Diagnosis not present

## 2019-12-03 DIAGNOSIS — D0461 Carcinoma in situ of skin of right upper limb, including shoulder: Secondary | ICD-10-CM | POA: Diagnosis not present

## 2019-12-03 DIAGNOSIS — Z85828 Personal history of other malignant neoplasm of skin: Secondary | ICD-10-CM | POA: Diagnosis not present

## 2019-12-03 DIAGNOSIS — X32XXXA Exposure to sunlight, initial encounter: Secondary | ICD-10-CM | POA: Diagnosis not present

## 2019-12-03 DIAGNOSIS — D485 Neoplasm of uncertain behavior of skin: Secondary | ICD-10-CM | POA: Diagnosis not present

## 2019-12-03 DIAGNOSIS — L570 Actinic keratosis: Secondary | ICD-10-CM | POA: Diagnosis not present

## 2019-12-03 DIAGNOSIS — D224 Melanocytic nevi of scalp and neck: Secondary | ICD-10-CM | POA: Diagnosis not present

## 2019-12-03 DIAGNOSIS — Z08 Encounter for follow-up examination after completed treatment for malignant neoplasm: Secondary | ICD-10-CM | POA: Diagnosis not present

## 2019-12-17 DIAGNOSIS — R69 Illness, unspecified: Secondary | ICD-10-CM | POA: Diagnosis not present

## 2020-01-14 DIAGNOSIS — Z85828 Personal history of other malignant neoplasm of skin: Secondary | ICD-10-CM | POA: Diagnosis not present

## 2020-01-14 DIAGNOSIS — Z08 Encounter for follow-up examination after completed treatment for malignant neoplasm: Secondary | ICD-10-CM | POA: Diagnosis not present

## 2020-04-22 ENCOUNTER — Ambulatory Visit (INDEPENDENT_AMBULATORY_CARE_PROVIDER_SITE_OTHER): Payer: Medicare HMO | Admitting: Cardiology

## 2020-04-22 ENCOUNTER — Encounter: Payer: Self-pay | Admitting: Cardiology

## 2020-04-22 ENCOUNTER — Other Ambulatory Visit: Payer: Self-pay

## 2020-04-22 VITALS — BP 134/70 | HR 92 | Ht 65.0 in | Wt 180.0 lb

## 2020-04-22 DIAGNOSIS — I10 Essential (primary) hypertension: Secondary | ICD-10-CM

## 2020-04-22 DIAGNOSIS — I4821 Permanent atrial fibrillation: Secondary | ICD-10-CM | POA: Diagnosis not present

## 2020-04-22 NOTE — Patient Instructions (Signed)
Medication Instructions:  Your physician recommends that you continue on your current medications as directed. Please refer to the Current Medication list given to you today.  *If you need a refill on your cardiac medications before your next appointment, please call your pharmacy*   Lab Work: None today If you have labs (blood work) drawn today and your tests are completely normal, you will receive your results only by: . MyChart Message (if you have MyChart) OR . A paper copy in the mail If you have any lab test that is abnormal or we need to change your treatment, we will call you to review the results.   Testing/Procedures: None today   Follow-Up: At [facility], you and your health needs are our priority.  As part of our continuing mission to provide you with exceptional heart care, we have created designated Provider Care Teams.  These Care Teams include your primary Cardiologist (physician) and Advanced Practice Providers (APPs -  Physician Assistants and Nurse Practitioners) who all work together to provide you with the care you need, when you need it.  We recommend signing up for the patient portal called "MyChart".  Sign up information is provided on this After Visit Summary.  MyChart is used to connect with patients for Virtual Visits (Telemedicine).  Patients are able to view lab/test results, encounter notes, upcoming appointments, etc.  Non-urgent messages can be sent to your provider as well.   To learn more about what you can do with MyChart, go to [email or web address].    Your next appointment:   12 month(s)  The format for your next appointment:   In Person  Provider:   Samuel McDowell, MD   Other Instructions None       Thank you for choosing [facility] !         

## 2020-04-22 NOTE — Progress Notes (Signed)
Cardiology Office Note  Date: 04/22/2020   ID: Andre, Gallego 02-25-1946, MRN [id number]  PCP:  Erven Colla, DO  Cardiologist:  Rozann Lesches, MD Electrophysiologist:  None   Chief Complaint  Patient presents with  . Cardiac follow-up    History of Present Illness: Sara Terry is a 75 y.o. female former patient of Dr. Bronson Ing now presenting to establish follow-up with me.  I reviewed her records and updated the chart.  She was last seen in February 2021.  She presents for a routine visit.  States that she has been doing well, does not feel any sense of palpitations, no exertional chest pain or unusual shortness of breath.  She works part-time at [employer].  She enjoys line dancing and also water aerobics.  I personally reviewed her ECG today which shows atrial fibrillation in the 90s.  I reviewed her medications, she reports compliance with Cardizem CD for heart rate control and also remains on Eliquis for stroke prophylaxis.  She is due for follow-up lab work and encounter with PCP.  Past Medical History:  Diagnosis Date  . Arthritis   . Atrial fibrillation ([place])   . Essential hypertension   . GERD (gastroesophageal reflux disease)   . Glucose intolerance (impaired glucose tolerance)   . Insomnia   . Raynaud phenomenon   . Skin cancer    Forehead    Past Surgical History:  Procedure Laterality Date  . ABDOMINOPLASTY    . BREAST REDUCTION SURGERY  2003  . COLONOSCOPY    . COLONOSCOPY N/A 06/13/2013   Procedure: COLONOSCOPY;  Surgeon: Rogene Houston, MD;  [facility];  Service: Endoscopy;  Laterality: N/A;  930  . FOOT SURGERY Left 02/09/2016  . TONSILLECTOMY    . TOTAL HIP ARTHROPLASTY Right 04/27/2016   Procedure: RIGHT TOTAL HIP ARTHROPLASTY ANTERIOR APPROACH;  Surgeon: Gaynelle Arabian, MD;  Location: [facility];  Service: Orthopedics;  Laterality: Right;  . TUBAL LIGATION      Current Outpatient Medications  Medication Sig Dispense Refill  .  atorvastatin (LIPITOR) 40 MG tablet TAKE 1 TABLET DAILY AT 6PM 90 tablet 3  . calcium carbonate (OS-CAL - DOSED IN MG OF ELEMENTAL CALCIUM) 1250 (500 Ca) MG tablet Take 1 tablet by mouth 2 (two) times daily with a meal.     . diltiazem (CARDIZEM CD) 300 MG 24 hr capsule TAKE 1 CAPSULE DAILY 90 capsule 3  . docusate sodium (COLACE) 100 MG capsule Take 100 mg by mouth daily.    Marland Kitchen ELIQUIS 5 MG TABS tablet TAKE 1 TABLET TWICE A DAY 180 tablet 3  . enalapril (VASOTEC) 20 MG tablet TAKE 1 TABLET DAILY 90 tablet 3  . hydrochlorothiazide (HYDRODIURIL) 25 MG tablet TAKE 1 TABLET EVERY MORNING 90 tablet 3  . zolpidem (AMBIEN) 10 MG tablet Take 1 tablet (10 mg total) by mouth at bedtime as needed for sleep. 30 tablet 1   No current facility-administered medications for this visit.   Allergies:  Maxzide [triamterene-hctz] and Penicillins   ROS: No palpitations or syncope.  Physical Exam: VS:  BP 134/70   Pulse 92   Ht 5\' 5"  (1.651 m)   Wt 180 lb (81.6 kg)   SpO2 96%   BMI 29.95 kg/m , BMI Body mass index is 29.95 kg/m.  Wt Readings from Last 3 Encounters:  04/22/20 180 lb (81.6 kg)  10/06/19 177 lb (80.3 kg)  05/08/19 179 lb (81.2 kg)    General: Patient  appears comfortable at rest. HEENT: Conjunctiva and lids normal, wearing a mask. Neck: Supple, no elevated JVP or carotid bruits, no thyromegaly. Lungs: Clear to auscultation, nonlabored breathing at rest. Cardiac: Irregularly irregular, no S3 or significant systolic murmur, no pericardial rub. Extremities: No pitting edema.  ECG:  An ECG dated 04/12/2018 was personally reviewed today and demonstrated:  Rate controlled atrial fibrillation, low voltage.  Recent Labwork: 05/01/2019: ALT 13; AST 16; BUN 13; Creatinine, Ser 0.61; Hemoglobin 14.0; Platelets 283; Potassium 4.5; Sodium 138     Component Value Date/Time   CHOL 157 05/01/2019 0818   TRIG 52 05/01/2019 0818   HDL 79 05/01/2019 0818   CHOLHDL 2.0 05/01/2019 0818   CHOLHDL 2.2  04/12/2018 0943   VLDL 7 04/12/2018 0943   LDLCALC 67 05/01/2019 0818    Other Studies Reviewed Today:  Echocardiogram 11/10/2015: - Left ventricle: The cavity size was normal. Wall thickness was  normal. Systolic function was normal. The estimated ejection  fraction was in the range of 60% to 65%. Wall motion was normal;  there were no regional wall motion abnormalities. The study is  not technically sufficient to allow evaluation of LV diastolic  function.  - Mitral valve: Mildly calcified annulus. There was moderate  regurgitation.  - Left atrium: The atrium was moderately dilated.  - Right ventricle: Systolic function was mildly reduced.  - Right atrium: The atrium was moderately dilated.  - Tricuspid valve: There was moderate regurgitation.   Assessment and Plan:  1.  Permanent atrial fibrillation with CHA2DS2-VASc score of 4-5.  She is asymptomatic in terms of palpitations, doing well on Cardizem CD along with Eliquis.  She does not describe any spontaneous bleeding problems.  ECG reviewed.  Encouraged follow-up with Dr. Lovena Le for physical and lab work in [facility] to assessment last February.  2.  Essential hypertension, in addition to Cardizem CD she is also on Vasotec and HCTZ.  Blood pressure is reasonably well controlled today.  No changes were made.  Medication Adjustments/Labs and Tests Ordered: Current medicines are reviewed at length with the patient today.  Concerns regarding medicines are outlined above.   Tests Ordered: Orders Placed This Encounter  Procedures  . EKG 12-Lead    Medication Changes: No orders of the defined types were placed in this encounter.   Disposition:  Follow up 1 year in the [place] office.  Signed, Satira Sark, MD, [facility] 04/22/2020 11:16 AM    [facility] [place]. [address] [place], [place] [address] Phone: [phone]; Fax: [phone]

## 2020-05-01 ENCOUNTER — Other Ambulatory Visit: Payer: Self-pay

## 2020-05-04 MED ORDER — APIXABAN 5 MG PO TABS: 5.0000 mg | ORAL_TABLET | Freq: Two times a day (BID) | ORAL | 3 refills | Status: DC

## 2020-05-05 ENCOUNTER — Other Ambulatory Visit: Payer: Self-pay

## 2020-05-05 ENCOUNTER — Encounter: Payer: Self-pay | Admitting: Family Medicine

## 2020-05-05 ENCOUNTER — Ambulatory Visit (INDEPENDENT_AMBULATORY_CARE_PROVIDER_SITE_OTHER): Payer: Medicare HMO | Admitting: Family Medicine

## 2020-05-05 VITALS — BP 136/86 | HR 70 | Temp 97.2°F | Ht 65.0 in | Wt 180.4 lb

## 2020-05-05 DIAGNOSIS — E785 Hyperlipidemia, unspecified: Secondary | ICD-10-CM | POA: Diagnosis not present

## 2020-05-05 DIAGNOSIS — I10 Essential (primary) hypertension: Secondary | ICD-10-CM | POA: Diagnosis not present

## 2020-05-05 DIAGNOSIS — R7301 Impaired fasting glucose: Secondary | ICD-10-CM

## 2020-05-05 DIAGNOSIS — I4891 Unspecified atrial fibrillation: Secondary | ICD-10-CM

## 2020-05-05 DIAGNOSIS — G4700 Insomnia, unspecified: Secondary | ICD-10-CM

## 2020-05-05 MED ORDER — ZOLPIDEM TARTRATE 10 MG PO TABS: 5.0000 mg | ORAL_TABLET | Freq: Every evening | ORAL | 0 refills | Status: DC | PRN

## 2020-05-05 NOTE — Progress Notes (Signed)
Patient ID: Sara Terry, female    DOB: April 24, 1945, 75 y.o.   MRN: [id number]   Chief Complaint  Patient presents with  . Hypertension   Subjective:    HPI   Pt here for HTN follow up. Pt states no issues with HTN. Does not check blood pressure at home.  Pt would like refill on Ambien. Pt uses very rarely and only takes 1/2 tablet. The bottle she has now is expired.  HTN Pt compliant with BP meds.  No SEs Denies chest pain, sob, LE swelling, or blurry vision. Doing well with bp meds.   Afib- Seeing cards 2-3 wks ago.  afib - and doing and taking eliquids. No bleeding.  Last labs 2/21-  Insomnia- occ taking ambien.  30 tab.  Taking it only prn 1/2 tab.  Normally taking benadryl, but is feeling groggy with it.  Not been sick in past year.  Had vaccines 2x and booster. For covid.  Medical History Sara Terry has a past medical history of Arthritis, Atrial fibrillation ([place]), Essential hypertension, GERD (gastroesophageal reflux disease), Glucose intolerance (impaired glucose tolerance), Insomnia, Raynaud phenomenon, and Skin cancer.   Outpatient Encounter Medications as of 05/05/2020  Medication Sig  . apixaban (ELIQUIS) 5 MG TABS tablet Take 1 tablet (5 mg total) by mouth 2 (two) times daily.  Marland Kitchen atorvastatin (LIPITOR) 40 MG tablet TAKE 1 TABLET DAILY AT 6PM  . calcium carbonate (OS-CAL - DOSED IN MG OF ELEMENTAL CALCIUM) 1250 (500 Ca) MG tablet Take 1 tablet by mouth 2 (two) times daily with a meal.   . diltiazem (CARDIZEM CD) 300 MG 24 hr capsule TAKE 1 CAPSULE DAILY  . docusate sodium (COLACE) 100 MG capsule Take 100 mg by mouth daily.  . enalapril (VASOTEC) 20 MG tablet TAKE 1 TABLET DAILY  . hydrochlorothiazide (HYDRODIURIL) 25 MG tablet TAKE 1 TABLET EVERY MORNING  . [DISCONTINUED] zolpidem (AMBIEN) 10 MG tablet Take 1 tablet (10 mg total) by mouth at bedtime as needed for sleep.  Marland Kitchen zolpidem (AMBIEN) 10 MG tablet Take 0.5 tablets (5 mg total) by mouth at bedtime as  needed for sleep.   No facility-administered encounter medications on file as of 05/05/2020.     Review of Systems Gen: negative for fever, chills, fatigue  HEENT: negative for runny nose, sore throat, eye or ear pain/drainage CV: negative for chest pain, palpitations. Lungs: negative for cough, sob, wheezing. Abd: Negative for n/v/d, or abd pain. GU:  negative for pelvic pain, dysuria, hematuria, frequency or urgency. MSK: negative for neck/ arm/ leg pain, numbness, or weakness. Back: negative for back pain. Neuro: negative for numbness, tingling, paresthesia, weakness, or dizziness. Skin: negative for sores/lesions.   Vitals BP 136/86   Pulse 70   Temp (!) 97.2 F (36.2 C)   Ht '5\' 5"'  (1.651 m)   Wt 180 lb 6.4 oz (81.8 kg)   SpO2 98%   BMI 30.02 kg/m   Objective:   Physical Exam Vitals and nursing note reviewed.  Constitutional:      Appearance: Normal appearance.  HENT:     Head: Normocephalic and atraumatic.     Nose: Nose normal.     Mouth/Throat:     Mouth: Mucous membranes are moist.     Pharynx: Oropharynx is clear.  Eyes:     Extraocular Movements: Extraocular movements intact.     Conjunctiva/sclera: Conjunctivae normal.     Pupils: Pupils are equal, round, and reactive to light.  Cardiovascular:  Rate and Rhythm: Normal rate and regular rhythm.     Pulses: Normal pulses.     Heart sounds: Normal heart sounds.  Pulmonary:     Effort: Pulmonary effort is normal.     Breath sounds: Normal breath sounds. No wheezing, rhonchi or rales.  Musculoskeletal:        General: Normal range of motion.     Right lower leg: No edema.     Left lower leg: No edema.  Skin:    General: Skin is warm and dry.     Findings: No lesion or rash.  Neurological:     General: No focal deficit present.     Mental Status: She is alert and oriented to person, place, and time.  Psychiatric:        Mood and Affect: Mood normal.        Behavior: Behavior normal.       Assessment and Plan   1. New onset a-fib (Sara Terry) - CMP14+EGFR  2. Essential hypertension, benign - CMP14+EGFR - Lipid panel  3. Hyperlipidemia, unspecified hyperlipidemia type - CBC - CMP14+EGFR - Lipid panel  4. Impaired fasting blood sugar - Hemoglobin A1c  5. Insomnia, unspecified type - zolpidem (AMBIEN) 10 MG tablet; Take 0.5 tablets (5 mg total) by mouth at bedtime as needed for sleep.  Dispense: 30 tablet; Refill: 0   Discussed safety concerns in taking ambien over long term.  Pt oonly taking 30 tab per yr.  1/2 tab prn. Labs ordered.  Will call patient with results. A. fib stable continue medications continue follow-up with cardiology.  Hyperlipidemia we will order labs, continue current medications.  Impaired fasting blood sugar, will recheck A1c.  Hypertension-suboptimal patient to decrease salt intake and will recheck on next visit.  Continue current medications.  Insomnia-stable, patient takes 0.5 of Ambien 10 mg, as needed as needed for insomnia. Discussion with patient about taking long-term Ambien in patients over 65.  Advised patient to try to decrease to 1/2 tablet as needed.  Patient in agreement and voiced understanding.  Return in about 1 year (around 05/05/2021) for f/u insomnia/afib/hld.  [place], DO 05/17/2020

## 2020-05-06 LAB — CMP14+EGFR
ALT: 12 IU/L (ref 0–32)
AST: 17 IU/L (ref 0–40)
Albumin/Globulin Ratio: 2.3 — ABNORMAL HIGH (ref 1.2–2.2)
Albumin: 4.8 g/dL — ABNORMAL HIGH (ref 3.7–4.7)
Alkaline Phosphatase: 69 IU/L (ref 44–121)
BUN/Creatinine Ratio: 17 (ref 12–28)
BUN: 11 mg/dL (ref 8–27)
Bilirubin Total: 0.5 mg/dL (ref 0.0–1.2)
CO2: 25 mmol/L (ref 20–29)
Calcium: 9.8 mg/dL (ref 8.7–10.3)
Chloride: 96 mmol/L (ref 96–106)
Creatinine, Ser: 0.66 mg/dL (ref 0.57–1.00)
Globulin, Total: 2.1 g/dL (ref 1.5–4.5)
Glucose: 116 mg/dL — ABNORMAL HIGH (ref 65–99)
Potassium: 4.8 mmol/L (ref 3.5–5.2)
Sodium: 137 mmol/L (ref 134–144)
Total Protein: 6.9 g/dL (ref 6.0–8.5)
eGFR: 93 mL/min/{1.73_m2} (ref >59)

## 2020-05-06 LAB — CBC
Hematocrit: 41.3 % (ref 34.0–46.6)
Hemoglobin: 14.1 g/dL (ref 11.1–15.9)
MCH: 32.9 pg (ref 26.6–33.0)
MCHC: 34.1 g/dL (ref 31.5–35.7)
MCV: 97 fL (ref 79–97)
Platelets: 292 10*3/uL (ref 150–450)
RBC: 4.28 x10E6/uL (ref 3.77–5.28)
RDW: 11.9 % (ref 11.7–15.4)
WBC: 4.7 10*3/uL (ref 3.4–10.8)

## 2020-05-06 LAB — LIPID PANEL
Chol/HDL Ratio: 1.8 ratio (ref 0.0–4.4)
Cholesterol, Total: 175 mg/dL (ref 100–199)
HDL: 96 mg/dL (ref >39)
LDL Chol Calc (NIH): 70 mg/dL (ref 0–99)
Triglycerides: 40 mg/dL (ref 0–149)
VLDL Cholesterol Cal: 9 mg/dL (ref 5–40)

## 2020-05-06 LAB — HEMOGLOBIN A1C
Est. average glucose Bld gHb Est-mCnc: 123 mg/dL
Hgb A1c MFr Bld: 5.9 % — ABNORMAL HIGH (ref 4.8–5.6)

## 2020-06-04 ENCOUNTER — Encounter: Payer: Self-pay | Admitting: Podiatry

## 2020-06-04 ENCOUNTER — Other Ambulatory Visit: Payer: Self-pay

## 2020-06-04 ENCOUNTER — Ambulatory Visit: Payer: Medicare HMO | Admitting: Podiatry

## 2020-06-04 DIAGNOSIS — M778 Other enthesopathies, not elsewhere classified: Secondary | ICD-10-CM

## 2020-06-04 DIAGNOSIS — Q828 Other specified congenital malformations of skin: Secondary | ICD-10-CM

## 2020-06-04 MED ORDER — TRIAMCINOLONE ACETONIDE 10 MG/ML IJ SUSP
10.0000 mg | Freq: Once | INTRAMUSCULAR | Status: AC
  Administered 2020-06-04: 10 mg

## 2020-06-04 NOTE — Progress Notes (Signed)
Subjective:   Patient ID: Sara Terry, female   DOB: 75 y.o.   MRN: [id number]   HPI Patient states she is developed a lot of pain inflammation in the bottom of her right foot on the outside and also has a lesion which is becoming painful   ROS      Objective:  Physical Exam  Neurovascular status intact with inflammation fluid around the fifth MPJ right painful when pressed with lesion formation present with lucent core      Assessment:  Inflammatory capsulitis fifth MPJ right fluid buildup with a porokeratotic type lesion there and slightly medial to the area of the fluid buildup     Plan:  H&P sterile prep injected the capsule fifth MPJ plantar 2 mg dexamethasone 2 mg Kenalog and then debrided the separate lesion with no iatrogenic bleeding reappoint as needed

## 2020-06-15 NOTE — Progress Notes (Signed)
Subjective:   Sara Terry is a 75 y.o. female who presents for Medicare Annual (Subsequent) preventive examination.  Review of Systems    N/A  Cardiac Risk Factors include: advanced age (>46men, >103 women);hypertension;dyslipidemia     Objective:    Today's Vitals   06/16/20 1307  BP: 120/84  Pulse: (!) 101  Temp: 97.6 F (36.4 C)  TempSrc: Oral  SpO2: 96%  Weight: 180 lb 6 oz (81.8 kg)  Height: 5\' 5"  (1.651 m)   Body mass index is 30.02 kg/m.  Advanced Directives 06/16/2020 04/27/2016 04/21/2016 11/08/2015 06/13/2013  Does Patient Have a Medical Advance Directive? No No No No Patient does not have advance directive  Would patient like information on creating a medical advance directive? No - Patient declined No - Patient declined - No - patient declined information -  Pre-existing out of facility DNR order (yellow form or pink MOST form) - - - - No    Current Medications (verified) Outpatient Encounter Medications as of 06/16/2020  Medication Sig  . apixaban (ELIQUIS) 5 MG TABS tablet Take 1 tablet (5 mg total) by mouth 2 (two) times daily.  Marland Kitchen atorvastatin (LIPITOR) 40 MG tablet TAKE 1 TABLET DAILY AT 6PM  . calcium carbonate (OS-CAL - DOSED IN MG OF ELEMENTAL CALCIUM) 1250 (500 Ca) MG tablet Take 1 tablet by mouth 2 (two) times daily with a meal.   . diltiazem (CARDIZEM CD) 300 MG 24 hr capsule TAKE 1 CAPSULE DAILY  . docusate sodium (COLACE) 100 MG capsule Take 100 mg by mouth daily.  . enalapril (VASOTEC) 20 MG tablet TAKE 1 TABLET DAILY  . hydrochlorothiazide (HYDRODIURIL) 25 MG tablet TAKE 1 TABLET EVERY MORNING  . zolpidem (AMBIEN) 10 MG tablet Take 0.5 tablets (5 mg total) by mouth at bedtime as needed for sleep.   No facility-administered encounter medications on file as of 06/16/2020.    Allergies (verified) Maxzide [triamterene-hctz] and Penicillins   History: Past Medical History:  Diagnosis Date  . Arthritis   . Atrial fibrillation ([place])   . Essential  hypertension   . GERD (gastroesophageal reflux disease)   . Glucose intolerance (impaired glucose tolerance)   . Insomnia   . Raynaud phenomenon   . Skin cancer    Forehead   Past Surgical History:  Procedure Laterality Date  . ABDOMINOPLASTY    . BREAST REDUCTION SURGERY  2003  . COLONOSCOPY    . COLONOSCOPY N/A 06/13/2013   Procedure: COLONOSCOPY;  Surgeon: Rogene Houston, MD;  [facility];  Service: Endoscopy;  Laterality: N/A;  930  . FOOT SURGERY Left 02/09/2016  . TONSILLECTOMY    . TOTAL HIP ARTHROPLASTY Right 04/27/2016   Procedure: RIGHT TOTAL HIP ARTHROPLASTY ANTERIOR APPROACH;  Surgeon: Gaynelle Arabian, MD;  Location: [facility];  Service: Orthopedics;  Laterality: Right;  . TUBAL LIGATION     Family History  Problem Relation Age of Onset  . Hypertension Mother   . Hypertension Father   . Diabetes Father   . Heart attack Father   . Hypertension Brother    Social History   Socioeconomic History  . Marital status: Married    Spouse name: Not on file  . Number of children: Not on file  . Years of education: Not on file  . Highest education level: Not on file  Occupational History  . Not on file  Tobacco Use  . Smoking status: Former Smoker    Start date: 03/07/1964    Quit  date: 03/07/1978    Years since quitting: 42.3  . Smokeless tobacco: Never Used  Vaping Use  . Vaping Use: Never used  Substance and Sexual Activity  . Alcohol use: Yes    Comment: 2.5 glasses daily   . Drug use: No  . Sexual activity: Not on file  Other Topics Concern  . Not on file  Social History Narrative  . Not on file   Social Determinants of Health   Financial Resource Strain: Low Risk   . Difficulty of Paying Living Expenses: Not hard at all  Food Insecurity: No Food Insecurity  . Worried About Charity fundraiser in the Last Year: Never true  . Ran Out of Food in the Last Year: Never true  Transportation Needs: No Transportation Needs  . Lack of Transportation  (Medical): No  . Lack of Transportation (Non-Medical): No  Physical Activity: Sufficiently Active  . Days of Exercise per Week: 3 days  . Minutes of Exercise per Session: 60 min  Stress: No Stress Concern Present  . Feeling of Stress : Not at all  Social Connections: Moderately Integrated  . Frequency of Communication with Friends and Family: Three times a week  . Frequency of Social Gatherings with Friends and Family: Twice a week  . Attends Religious Services: More than 4 times per year  . Active Member of Clubs or Organizations: No  . Attends Archivist Meetings: Never  . Marital Status: Married    Tobacco Counseling Counseling given: Not Answered   Clinical Intake:  Pre-visit preparation completed: Yes  Pain : No/denies pain     Nutritional Risks: None Diabetes: No  How often do you need to have someone help you when you read instructions, pamphlets, or other written materials from your doctor or pharmacy?: 1 - Never  Diabetic?No  Interpreter Needed?: No  Information entered by :: [place] of Daily Living In your present state of health, do you have any difficulty performing the following activities: 06/16/2020  Hearing? N  Vision? N  Difficulty concentrating or making decisions? N  Walking or climbing stairs? N  Dressing or bathing? N  Doing errands, shopping? N  Preparing Food and eating ? N  Using the Toilet? N  In the past six months, have you accidently leaked urine? N  Do you have problems with loss of bowel control? N  Managing your Medications? N  Managing your Finances? N  Housekeeping or managing your Housekeeping? N  Some recent data might be hidden    Patient Care Team: Erven Colla, DO as PCP - General (Family Medicine) Satira Sark, MD as PCP - Cardiology (Cardiology)  Indicate any recent Medical Services you may have received from other than [facility] providers in the past year (date may be approximate).      Assessment:   This is a routine wellness examination for [name].  Hearing/Vision screen  Hearing Screening   125Hz  250Hz  500Hz  1000Hz  2000Hz  3000Hz  4000Hz  6000Hz  8000Hz   Right ear:           Left ear:           Vision Screening Comments: Gets eyes examined once per year. Wears glasses.   Dietary issues and exercise activities discussed: Current Exercise Habits: Structured exercise class, Time (Minutes): 60, Frequency (Times/Week): 3, Weekly Exercise (Minutes/Week): 180, Intensity: Moderate, Exercise limited by: None identified  Goals    . Weight (lb) < 160 lb (72.6 kg)  Depression Screen PHQ 2/9 Scores 06/16/2020 05/05/2020 05/08/2019  PHQ - 2 Score 0 0 0    Fall Risk Fall Risk  06/16/2020 05/05/2020 05/08/2019  Falls in the past year? 0 0 0  Number falls in past yr: 0 0 0  Injury with Fall? 0 0 0  Risk for fall due to : No Fall Risks - -  Follow up Falls evaluation completed;Falls prevention discussed Falls evaluation completed Falls evaluation completed    FALL RISK PREVENTION PERTAINING TO THE HOME:  Any stairs in or around the home? Yes  If so, are there any without handrails? No  Home free of loose throw rugs in walkways, pet beds, electrical cords, etc? Yes  Adequate lighting in your home to reduce risk of falls? Yes   ASSISTIVE DEVICES UTILIZED TO PREVENT FALLS:  Life alert? No  Use of a cane, walker or w/c? No  Grab bars in the bathroom? No  Shower chair or bench in shower? No  Elevated toilet seat or a handicapped toilet? Yes   TIMED UP AND GO:  Was the test performed? Yes .  Length of time to ambulate 10 feet: 3 sec.   Gait steady and fast without use of assistive device  Cognitive Function:   Normal cognitive status assessed by direct observation by this Nurse Health Advisor. No abnormalities found.        Immunizations Immunization History  Administered Date(s) Administered  . Influenza,inj,Quad PF,6+ Mos 01/05/2015  . Influenza-Unspecified  12/17/2013, 01/04/2016, 04/30/2017, 01/21/2018, 01/12/2019, 12/17/2019  . Moderna Sars-Covid-2 Vaccination 04/20/2019, 05/19/2019, 01/11/2020  . Pneumococcal Conjugate-13 06/12/2019  . Pneumococcal Polysaccharide-23 12/26/2013  . Zoster 04/24/2009  . Zoster Recombinat (Shingrix) 07/29/2019, 11/08/2019    TDAP status: Due, Education has been provided regarding the importance of this vaccine. Advised may receive this vaccine at local pharmacy or Health Dept. Aware to provide a copy of the vaccination record if obtained from local pharmacy or Health Dept. Verbalized acceptance and understanding.  Flu Vaccine status: Up to date  Pneumococcal vaccine status: Up to date  Covid-19 vaccine status: Completed vaccines  Qualifies for Shingles Vaccine? Yes   Zostavax completed Yes   Shingrix Completed?: Yes  Screening Tests Health Maintenance  Topic Date Due  . Hepatitis C Screening  Never done  . TETANUS/TDAP  Never done  . MAMMOGRAM  10/23/2019  . INFLUENZA VACCINE  10/05/2020  . COLONOSCOPY (Pts 45-47yrs Insurance coverage will need to be confirmed)  06/14/2023  . DEXA SCAN  Completed  . COVID-19 Vaccine  Completed  . PNA vac Low Risk Adult  Completed  . HPV VACCINES  Aged Out    Health Maintenance  Health Maintenance Due  Topic Date Due  . Hepatitis C Screening  Never done  . TETANUS/TDAP  Never done  . MAMMOGRAM  10/23/2019    Colorectal cancer screening: Type of screening: Colonoscopy. Completed 06/13/2013. Repeat every 10 years  Mammogram status: Ordered 06/16/2020. Pt provided with contact info and advised to call to schedule appt.   Bone Density status: Completed 05/15/2019. Results reflect: Bone density results: NORMAL. Repeat every 0 years.  Lung Cancer Screening: (Low Dose CT Chest recommended if Age 45-80 years, 30 pack-year currently smoking OR have quit w/in 15years.) does not qualify.   Lung Cancer Screening Referral: N/A   Additional Screening:  Hepatitis C  Screening: does qualify  Vision Screening: Recommended annual ophthalmology exams for early detection of glaucoma and other disorders of the eye. Is the patient up to date  with their annual eye exam?  Yes  Who is the provider or what is the name of the office in which the patient attends annual eye exams? Dr. Jorja Loa at [place] If pt is not established with a provider, would they like to be referred to a provider to establish care? No .   Dental Screening: Recommended annual dental exams for proper oral hygiene  Community Resource Referral / Chronic Care Management: CRR required this visit?  No   CCM required this visit?  No      Plan:     I have personally reviewed and noted the following in the patient's chart:   . Medical and social history . Use of alcohol, tobacco or illicit drugs  . Current medications and supplements . Functional ability and status . Nutritional status . Physical activity . Advanced directives . List of other physicians . Hospitalizations, surgeries, and ER visits in previous 12 months . Vitals . Screenings to include cognitive, depression, and falls . Referrals and appointments  In addition, I have reviewed and discussed with patient certain preventive protocols, quality metrics, and best practice recommendations. A written personalized care plan for preventive services as well as general preventive health recommendations were provided to patient.     Ofilia Neas, LPN   9/47/[id number]   Nurse Notes: None

## 2020-06-16 ENCOUNTER — Ambulatory Visit (INDEPENDENT_AMBULATORY_CARE_PROVIDER_SITE_OTHER): Payer: Medicare HMO

## 2020-06-16 ENCOUNTER — Other Ambulatory Visit: Payer: Self-pay

## 2020-06-16 VITALS — BP 120/84 | HR 101 | Temp 97.6°F | Ht 65.0 in | Wt 180.4 lb

## 2020-06-16 DIAGNOSIS — Z1231 Encounter for screening mammogram for malignant neoplasm of breast: Secondary | ICD-10-CM

## 2020-06-16 DIAGNOSIS — Z0000 Encounter for general adult medical examination without abnormal findings: Secondary | ICD-10-CM

## 2020-06-16 NOTE — Patient Instructions (Signed)
Sara Terry , Thank you for taking time to come for your Medicare Wellness Visit. I appreciate your ongoing commitment to your health goals. Please review the following plan we discussed and let me know if I can assist you in the future.   Screening recommendations/referrals: Colonoscopy: Up to date, next due 06/14/2023 Mammogram: Currently due, orders placed this visit Bone Density: No longer required  Recommended yearly ophthalmology/optometry visit for glaucoma screening and checkup Recommended yearly dental visit for hygiene and checkup  Vaccinations: Influenza vaccine: Up to date, next due fall 2022  Pneumococcal vaccine: Completed series  Tdap vaccine: Currently due, you may await and injury to receive  Shingles vaccine: Completed series     Advanced directives: Advance directive discussed with you today. Even though you declined this today please call our office should you change your mind and we can give you the proper paperwork for you to fill out.   Conditions/risks identified: None   Next appointment: None    Preventive Care 65 Years and Older, Female Preventive care refers to lifestyle choices and visits with your health care provider that can promote health and wellness. What does preventive care include?  A yearly physical exam. This is also called an annual well check.  Dental exams once or twice a year.  Routine eye exams. Ask your health care provider how often you should have your eyes checked.  Personal lifestyle choices, including:  Daily care of your teeth and gums.  Regular physical activity.  Eating a healthy diet.  Avoiding tobacco and drug use.  Limiting alcohol use.  Practicing safe sex.  Taking low-dose aspirin every day.  Taking vitamin and mineral supplements as recommended by your health care provider. What happens during an annual well check? The services and screenings done by your health care provider during your annual well check  will depend on your age, overall health, lifestyle risk factors, and family history of disease. Counseling  Your health care provider may ask you questions about your:  Alcohol use.  Tobacco use.  Drug use.  Emotional well-being.  Home and relationship well-being.  Sexual activity.  Eating habits.  History of falls.  Memory and ability to understand (cognition).  Work and work Statistician.  Reproductive health. Screening  You may have the following tests or measurements:  Height, weight, and BMI.  Blood pressure.  Lipid and cholesterol levels. These may be checked every 5 years, or more frequently if you are over 98 years old.  Skin check.  Lung cancer screening. You may have this screening every year starting at age 73 if you have a 30-pack-year history of smoking and currently smoke or have quit within the past 15 years.  Fecal occult blood test (FOBT) of the stool. You may have this test every year starting at age 43.  Flexible sigmoidoscopy or colonoscopy. You may have a sigmoidoscopy every 5 years or a colonoscopy every 10 years starting at age 107.  Hepatitis C blood test.  Hepatitis B blood test.  Sexually transmitted disease (STD) testing.  Diabetes screening. This is done by checking your blood sugar (glucose) after you have not eaten for a while (fasting). You may have this done every 1-3 years.  Bone density scan. This is done to screen for osteoporosis. You may have this done starting at age 47.  Mammogram. This may be done every 1-2 years. Talk to your health care provider about how often you should have regular mammograms. Talk with your health care provider  about your test results, treatment options, and if necessary, the need for more tests. Vaccines  Your health care provider may recommend certain vaccines, such as:  Influenza vaccine. This is recommended every year.  Tetanus, diphtheria, and acellular pertussis (Tdap, Td) vaccine. You may  need a Td booster every 10 years.  Zoster vaccine. You may need this after age 1.  Pneumococcal 13-valent conjugate (PCV13) vaccine. One dose is recommended after age 14.  Pneumococcal polysaccharide (PPSV23) vaccine. One dose is recommended after age 13. Talk to your health care provider about which screenings and vaccines you need and how often you need them. This information is not intended to replace advice given to you by your health care provider. Make sure you discuss any questions you have with your health care provider. Document Released: 03/20/2015 Document Revised: 11/11/2015 Document Reviewed: 12/23/2014 Elsevier Interactive Patient Education  2017 [employer] Prevention in the Home Falls can cause injuries. They can happen to people of all ages. There are many things you can do to make your home safe and to help prevent falls. What can I do on the outside of my home?  Regularly fix the edges of walkways and driveways and fix any cracks.  Remove anything that might make you trip as you walk through a door, such as a raised step or threshold.  Trim any bushes or trees on the path to your home.  Use bright outdoor lighting.  Clear any walking paths of anything that might make someone trip, such as rocks or tools.  Regularly check to see if handrails are loose or broken. Make sure that both sides of any steps have handrails.  Any raised decks and porches should have guardrails on the edges.  Have any leaves, snow, or ice cleared regularly.  Use sand or salt on walking paths during winter.  Clean up any spills in your garage right away. This includes oil or grease spills. What can I do in the bathroom?  Use night lights.  Install grab bars by the toilet and in the tub and shower. Do not use towel bars as grab bars.  Use non-skid mats or decals in the tub or shower.  If you need to sit down in the shower, use a plastic, non-slip stool.  Keep the floor  dry. Clean up any water that spills on the floor as soon as it happens.  Remove soap buildup in the tub or shower regularly.  Attach bath mats securely with double-sided non-slip rug tape.  Do not have throw rugs and other things on the floor that can make you trip. What can I do in the bedroom?  Use night lights.  Make sure that you have a light by your bed that is easy to reach.  Do not use any sheets or blankets that are too big for your bed. They should not hang down onto the floor.  Have a firm chair that has side arms. You can use this for support while you get dressed.  Do not have throw rugs and other things on the floor that can make you trip. What can I do in the kitchen?  Clean up any spills right away.  Avoid walking on wet floors.  Keep items that you use a lot in easy-to-reach places.  If you need to reach something above you, use a strong step stool that has a grab bar.  Keep electrical cords out of the way.  Do not use floor polish or  wax that makes floors slippery. If you must use wax, use non-skid floor wax.  Do not have throw rugs and other things on the floor that can make you trip. What can I do with my stairs?  Do not leave any items on the stairs.  Make sure that there are handrails on both sides of the stairs and use them. Fix handrails that are broken or loose. Make sure that handrails are as long as the stairways.  Check any carpeting to make sure that it is firmly attached to the stairs. Fix any carpet that is loose or worn.  Avoid having throw rugs at the top or bottom of the stairs. If you do have throw rugs, attach them to the floor with carpet tape.  Make sure that you have a light switch at the top of the stairs and the bottom of the stairs. If you do not have them, ask someone to add them for you. What else can I do to help prevent falls?  Wear shoes that:  Do not have high heels.  Have rubber bottoms.  Are comfortable and fit you  well.  Are closed at the toe. Do not wear sandals.  If you use a stepladder:  Make sure that it is fully opened. Do not climb a closed stepladder.  Make sure that both sides of the stepladder are locked into place.  Ask someone to hold it for you, if possible.  Clearly mark and make sure that you can see:  Any grab bars or handrails.  First and last steps.  Where the edge of each step is.  Use tools that help you move around (mobility aids) if they are needed. These include:  Canes.  Walkers.  Scooters.  Crutches.  Turn on the lights when you go into a dark area. Replace any light bulbs as soon as they burn out.  Set up your furniture so you have a clear path. Avoid moving your furniture around.  If any of your floors are uneven, fix them.  If there are any pets around you, be aware of where they are.  Review your medicines with your doctor. Some medicines can make you feel dizzy. This can increase your chance of falling. Ask your doctor what other things that you can do to help prevent falls. This information is not intended to replace advice given to you by your health care provider. Make sure you discuss any questions you have with your health care provider. Document Released: 12/18/2008 Document Revised: 07/30/2015 Document Reviewed: 03/28/2014 Elsevier Interactive Patient Education  2017 [employer].

## 2020-06-29 ENCOUNTER — Other Ambulatory Visit: Payer: Self-pay | Admitting: Student

## 2020-07-10 ENCOUNTER — Telehealth: Payer: Self-pay

## 2020-07-10 MED ORDER — ENALAPRIL MALEATE 20 MG PO TABS: 20.0000 mg | ORAL_TABLET | Freq: Every day | ORAL | 3 refills | Status: DC

## 2020-07-10 NOTE — Telephone Encounter (Signed)
Hildred Priest, MD 9 minutes ago (3:00 PM)   I am unable to re-order my prescription for Enaparil 20 mg on [facility]  (perhaps it has expired).  Would you please prescribe this medication for me with them?  Thank you!   Message received from pt  Medication refill request approved and sent to [facility].

## 2020-07-13 ENCOUNTER — Other Ambulatory Visit: Payer: Self-pay | Admitting: *Deleted

## 2020-07-13 MED ORDER — HYDROCHLOROTHIAZIDE 25 MG PO TABS: 1.0000 | ORAL_TABLET | Freq: Every morning | ORAL | 3 refills | Status: DC

## 2020-07-16 ENCOUNTER — Telehealth: Payer: Self-pay | Admitting: Cardiology

## 2020-07-16 NOTE — Telephone Encounter (Signed)
[phone] In regards to recent medication use reference # [id number].

## 2020-07-16 NOTE — Telephone Encounter (Signed)
[facility] wanted verification of dosage of medication and provider.

## 2020-07-28 DIAGNOSIS — L570 Actinic keratosis: Secondary | ICD-10-CM | POA: Diagnosis not present

## 2020-07-28 DIAGNOSIS — X32XXXD Exposure to sunlight, subsequent encounter: Secondary | ICD-10-CM | POA: Diagnosis not present

## 2020-07-28 DIAGNOSIS — D225 Melanocytic nevi of trunk: Secondary | ICD-10-CM | POA: Diagnosis not present

## 2020-07-28 DIAGNOSIS — Z08 Encounter for follow-up examination after completed treatment for malignant neoplasm: Secondary | ICD-10-CM | POA: Diagnosis not present

## 2020-07-28 DIAGNOSIS — Z85828 Personal history of other malignant neoplasm of skin: Secondary | ICD-10-CM | POA: Diagnosis not present

## 2020-07-28 DIAGNOSIS — Z1283 Encounter for screening for malignant neoplasm of skin: Secondary | ICD-10-CM | POA: Diagnosis not present

## 2020-09-08 ENCOUNTER — Other Ambulatory Visit: Payer: Self-pay

## 2020-09-08 ENCOUNTER — Encounter: Payer: Self-pay | Admitting: Nurse Practitioner

## 2020-09-08 ENCOUNTER — Ambulatory Visit (INDEPENDENT_AMBULATORY_CARE_PROVIDER_SITE_OTHER): Payer: Medicare HMO | Admitting: Nurse Practitioner

## 2020-09-08 VITALS — BP 145/88 | HR 98 | Temp 97.3°F | Ht 65.5 in | Wt 180.0 lb

## 2020-09-08 DIAGNOSIS — I4891 Unspecified atrial fibrillation: Secondary | ICD-10-CM | POA: Diagnosis not present

## 2020-09-08 DIAGNOSIS — G4700 Insomnia, unspecified: Secondary | ICD-10-CM

## 2020-09-08 DIAGNOSIS — Z7689 Persons encountering health services in other specified circumstances: Secondary | ICD-10-CM

## 2020-09-08 DIAGNOSIS — M1611 Unilateral primary osteoarthritis, right hip: Secondary | ICD-10-CM

## 2020-09-08 DIAGNOSIS — Z139 Encounter for screening, unspecified: Secondary | ICD-10-CM | POA: Diagnosis not present

## 2020-09-08 DIAGNOSIS — I10 Essential (primary) hypertension: Secondary | ICD-10-CM

## 2020-09-08 DIAGNOSIS — R7301 Impaired fasting glucose: Secondary | ICD-10-CM

## 2020-09-08 NOTE — Patient Instructions (Signed)
Please have fasting labs drawn 2-3 days prior to your appointment so we can discuss the results during your office visit.  

## 2020-09-08 NOTE — Assessment & Plan Note (Signed)
-   doing well

## 2020-09-08 NOTE — Assessment & Plan Note (Addendum)
BP Readings from Last 3 Encounters:  09/08/20 (!) 145/88  06/16/20 120/84  05/05/20 136/86   -takes HCTZ and enalapril -drinks 2-3 glasses of wine per night; if BP creeps up, EtOH may be contributory

## 2020-09-08 NOTE — Progress Notes (Signed)
New Patient Office Visit  Subjective:  Patient ID: Sara Terry, female    DOB: [birth year]  Age: 75 y.o. MRN: [id number]  CC:  Chief Complaint  Patient presents with   New Patient (Initial Visit)    Here to establish care. No complaints today.    HPI Sara Terry presents for new patient visit. Transferring care from [place]. Last AWV was 06/16/20. Last labs drawn 05/05/20.  She has hx of HTN, a-fib, IFG, OA of hip and knee, HLD, and insomnia.  Past Medical History:  Diagnosis Date   Arthritis    right hip and right knee (had right hip replacement)   Atrial fibrillation (HCC)    Essential hypertension    GERD (gastroesophageal reflux disease)    Glucose intolerance (impaired glucose tolerance)    Insomnia    Raynaud phenomenon    Skin cancer    Forehead    Past Surgical History:  Procedure Laterality Date   ABDOMINOPLASTY     BREAST REDUCTION SURGERY  2003   COLONOSCOPY     COLONOSCOPY N/A 06/13/2013   Procedure: COLONOSCOPY;  Surgeon: Rogene Houston, MD;  Location: [facility];  Service: Endoscopy;  Laterality: N/A;  930   FOOT SURGERY Left 02/09/2016   TONSILLECTOMY     TOTAL HIP ARTHROPLASTY Right 04/27/2016   Procedure: RIGHT TOTAL HIP ARTHROPLASTY ANTERIOR APPROACH;  Surgeon: Gaynelle Arabian, MD;  Location: [facility];  Service: Orthopedics;  Laterality: Right;   TUBAL LIGATION      Family History  Problem Relation Age of Onset   Hypertension Mother    Hypertension Father    Diabetes Father    Heart attack Father    Hypertension Brother     Social History   Socioeconomic History   Marital status: Married    Spouse name: Not on file   Number of children: 2   Years of education: Not on file   Highest education level: Not on file  Occupational History   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[employer]  Tobacco Use   Smoking status: Former    Pack years: 0.00    Types: Cigarettes     Start date: 03/07/1964    Quit date: 03/07/1978    Years since quitting: 42.5   Smokeless tobacco: Never  Vaping Use   Vaping Use: Never used  Substance and Sexual Activity   Alcohol use: Yes    Comment: 2.5 glasses of wine daily   Drug use: No   Sexual activity: Not on file  Other Topics Concern   Not on file  Social History Narrative   1 child at [employer] and another in [place], [place]   Social Determinants of Health   Financial Resource Strain: Low Risk    Difficulty of Paying Living Expenses: Not hard at all  Food Insecurity: No Food Insecurity   Worried About Charity fundraiser in the Last Year: Never true   Arboriculturist in the Last Year: Never true  Transportation Needs: No Transportation Needs   Lack of Transportation (Medical): No   Lack of Transportation (Non-Medical): No  Physical Activity: Sufficiently Active   Days of Exercise per Week: 3 days   Minutes of Exercise per Session: 60 min  Stress: No Stress Concern Present   Feeling of Stress : Not at all  Social Connections: Moderately Integrated   Frequency of Communication with Friends  and Family: Three times a week   Frequency of Social Gatherings with Friends and Family: Twice a week   Attends Religious Services: More than 4 times per year   Active Member of [employer] or Organizations: No   Attends Archivist Meetings: Never   Marital Status: Married  Human resources officer Violence: Not At Risk   Fear of Current or Ex-Partner: No   Emotionally Abused: No   Physically Abused: No   Sexually Abused: No    ROS Review of Systems  Constitutional: Negative.   Respiratory: Negative.    Cardiovascular: Negative.   Musculoskeletal: Negative.   Psychiatric/Behavioral: Negative.     Objective:   Today's Vitals: BP (!) 145/88 (BP Location: Left Arm, Patient Position: Sitting, Cuff Size: Large)   Pulse 98   Temp (!) 97.3 F (36.3 C) (Temporal)   Ht 5' 5.5" (1.664 m)   Wt 180 lb (81.6 kg)   SpO2 98%    BMI 29.50 kg/m   Physical Exam Constitutional:      Appearance: Normal appearance.  Cardiovascular:     Rate and Rhythm: Normal rate. Rhythm irregular.     Pulses: Normal pulses.     Heart sounds: Normal heart sounds.  Pulmonary:     Effort: Pulmonary effort is normal.     Breath sounds: Normal breath sounds.  Musculoskeletal:        General: Normal range of motion.  Neurological:     Mental Status: She is alert.  Psychiatric:        Mood and Affect: Mood normal.        Behavior: Behavior normal.        Thought Content: Thought content normal.        Judgment: Judgment normal.    Assessment & Plan:   Problem List Items Addressed This Visit       Cardiovascular and Mediastinum   Essential hypertension, benign    BP Readings from Last 3 Encounters:  09/08/20 (!) 145/88  06/16/20 120/84  05/05/20 136/86  -takes HCTZ and enalapril -drinks 2-3 glasses of wine per night; if BP creeps up, EtOH may be contributory       Relevant Orders   CBC with Differential/Platelet   CMP14+EGFR   Lipid Panel With LDL/HDL Ratio   New onset a-fib ([place])    -uses eliquis for anticoagulation -takes cardizem for rate control       Relevant Orders   CBC with Differential/Platelet   CMP14+EGFR   Lipid Panel With LDL/HDL Ratio     Endocrine   Impaired fasting glucose    -will check A1c with next set of labs       Relevant Orders   Hepatitis C Antibody     Musculoskeletal and Integument   OA (osteoarthritis) of hip    -doing well         Other   Insomnia    -takes ambien sparingly; only takes a half tablet if she hasn't slept well for several days       Other Visit Diagnoses     Encounter to establish care    -  Primary   Relevant Orders   Hepatitis C Antibody   CBC with Differential/Platelet   CMP14+EGFR   Lipid Panel With LDL/HDL Ratio   Hemoglobin A1c   Screening due       Relevant Orders   Hepatitis C Antibody       Outpatient Encounter Medications as  of 09/08/2020  Medication Sig  apixaban (ELIQUIS) 5 MG TABS tablet Take 1 tablet (5 mg total) by mouth 2 (two) times daily.   atorvastatin (LIPITOR) 40 MG tablet TAKE 1 TABLET DAILY AT 6PM   calcium carbonate (OS-CAL - DOSED IN MG OF ELEMENTAL CALCIUM) 1250 (500 Ca) MG tablet Take 1 tablet by mouth 2 (two) times daily with a meal.    diltiazem (CARDIZEM CD) 300 MG 24 hr capsule TAKE 1 CAPSULE DAILY   docusate sodium (COLACE) 100 MG capsule Take 100 mg by mouth daily.   enalapril (VASOTEC) 20 MG tablet Take 1 tablet (20 mg total) by mouth daily.   hydrochlorothiazide (HYDRODIURIL) 25 MG tablet Take 1 tablet (25 mg total) by mouth every morning.   zolpidem (AMBIEN) 10 MG tablet Take 0.5 tablets (5 mg total) by mouth at bedtime as needed for sleep. (Patient not taking: Reported on 09/08/2020)   No facility-administered encounter medications on file as of 09/08/2020.    Follow-up: Return in about 6 months (around 03/11/2021) for Lab follow-up (HLD, HTN, a-fib).   Noreene Larsson, NP

## 2020-09-08 NOTE — Assessment & Plan Note (Signed)
-  takes [place] sparingly; only takes a half tablet if she hasn't slept well for several days

## 2020-09-08 NOTE — Assessment & Plan Note (Signed)
-  will check A1c with next set of labs

## 2020-09-08 NOTE — Assessment & Plan Note (Signed)
-  uses eliquis for anticoagulation -takes cardizem for rate control

## 2020-11-24 ENCOUNTER — Encounter: Payer: Self-pay | Admitting: Nurse Practitioner

## 2020-11-27 DIAGNOSIS — Z1231 Encounter for screening mammogram for malignant neoplasm of breast: Secondary | ICD-10-CM | POA: Diagnosis not present

## 2020-11-27 LAB — HM MAMMOGRAPHY

## 2020-12-18 ENCOUNTER — Encounter: Payer: Self-pay | Admitting: Nurse Practitioner

## 2020-12-21 ENCOUNTER — Encounter: Payer: Self-pay | Admitting: *Deleted

## 2021-01-19 ENCOUNTER — Encounter: Payer: Self-pay | Admitting: Nurse Practitioner

## 2021-01-26 DIAGNOSIS — X32XXXD Exposure to sunlight, subsequent encounter: Secondary | ICD-10-CM | POA: Diagnosis not present

## 2021-01-26 DIAGNOSIS — C44612 Basal cell carcinoma of skin of right upper limb, including shoulder: Secondary | ICD-10-CM | POA: Diagnosis not present

## 2021-01-26 DIAGNOSIS — L570 Actinic keratosis: Secondary | ICD-10-CM | POA: Diagnosis not present

## 2021-01-27 ENCOUNTER — Encounter: Payer: Self-pay | Admitting: Cardiology

## 2021-02-01 ENCOUNTER — Other Ambulatory Visit: Payer: Self-pay

## 2021-02-01 MED ORDER — APIXABAN 5 MG PO TABS: 5.0000 mg | ORAL_TABLET | Freq: Two times a day (BID) | ORAL | 3 refills | Status: DC

## 2021-02-01 NOTE — Telephone Encounter (Signed)
[facility] [address] [facility] for eliquis 5 mg bid,#60

## 2021-02-01 NOTE — Telephone Encounter (Signed)
I normally get my prescriptions through [facility].  However, I have entered the "donut hole" phase of my plan and a 90 day refill of Eliquis would cost $328.  They have suggested that I get a 30 day prescription refilled [facility].  Would it be possible for you to prescribe me a 30 day supply?

## 2021-03-02 DIAGNOSIS — Z08 Encounter for follow-up examination after completed treatment for malignant neoplasm: Secondary | ICD-10-CM | POA: Diagnosis not present

## 2021-03-02 DIAGNOSIS — Z85828 Personal history of other malignant neoplasm of skin: Secondary | ICD-10-CM | POA: Diagnosis not present

## 2021-03-09 DIAGNOSIS — R7301 Impaired fasting glucose: Secondary | ICD-10-CM | POA: Diagnosis not present

## 2021-03-09 DIAGNOSIS — Z7689 Persons encountering health services in other specified circumstances: Secondary | ICD-10-CM | POA: Diagnosis not present

## 2021-03-09 DIAGNOSIS — Z139 Encounter for screening, unspecified: Secondary | ICD-10-CM | POA: Diagnosis not present

## 2021-03-09 DIAGNOSIS — I4891 Unspecified atrial fibrillation: Secondary | ICD-10-CM | POA: Diagnosis not present

## 2021-03-09 DIAGNOSIS — I10 Essential (primary) hypertension: Secondary | ICD-10-CM | POA: Diagnosis not present

## 2021-03-10 LAB — CBC WITH DIFFERENTIAL/PLATELET
Basophils Absolute: 0 10*3/uL (ref 0.0–0.2)
Basos: 1 %
EOS (ABSOLUTE): 0.1 10*3/uL (ref 0.0–0.4)
Eos: 3 %
Hematocrit: 36.4 % (ref 34.0–46.6)
Hemoglobin: 13 g/dL (ref 11.1–15.9)
Immature Grans (Abs): 0 10*3/uL (ref 0.0–0.1)
Immature Granulocytes: 0 %
Lymphocytes Absolute: 1.7 10*3/uL (ref 0.7–3.1)
Lymphs: 34 %
MCH: 34 pg — ABNORMAL HIGH (ref 26.6–33.0)
MCHC: 35.7 g/dL (ref 31.5–35.7)
MCV: 95 fL (ref 79–97)
Monocytes Absolute: 0.5 10*3/uL (ref 0.1–0.9)
Monocytes: 11 %
Neutrophils Absolute: 2.6 10*3/uL (ref 1.4–7.0)
Neutrophils: 51 %
Platelets: 310 10*3/uL (ref 150–450)
RBC: 3.82 x10E6/uL (ref 3.77–5.28)
RDW: 11.1 % — ABNORMAL LOW (ref 11.7–15.4)
WBC: 4.9 10*3/uL (ref 3.4–10.8)

## 2021-03-10 LAB — HEMOGLOBIN A1C
Est. average glucose Bld gHb Est-mCnc: 114 mg/dL
Hgb A1c MFr Bld: 5.6 % (ref 4.8–5.6)

## 2021-03-10 LAB — CMP14+EGFR
ALT: 12 IU/L (ref 0–32)
AST: 17 IU/L (ref 0–40)
Albumin/Globulin Ratio: 2.3 — ABNORMAL HIGH (ref 1.2–2.2)
Albumin: 4.2 g/dL (ref 3.7–4.7)
Alkaline Phosphatase: 75 IU/L (ref 44–121)
BUN/Creatinine Ratio: 21 (ref 12–28)
BUN: 13 mg/dL (ref 8–27)
Bilirubin Total: 0.3 mg/dL (ref 0.0–1.2)
CO2: 24 mmol/L (ref 20–29)
Calcium: 8.9 mg/dL (ref 8.7–10.3)
Chloride: 100 mmol/L (ref 96–106)
Creatinine, Ser: 0.62 mg/dL (ref 0.57–1.00)
Globulin, Total: 1.8 g/dL (ref 1.5–4.5)
Glucose: 122 mg/dL — ABNORMAL HIGH (ref 70–99)
Potassium: 4.5 mmol/L (ref 3.5–5.2)
Sodium: 139 mmol/L (ref 134–144)
Total Protein: 6 g/dL (ref 6.0–8.5)
eGFR: 93 mL/min/{1.73_m2} (ref >59)

## 2021-03-10 LAB — LIPID PANEL WITH LDL/HDL RATIO
Cholesterol, Total: 175 mg/dL (ref 100–199)
HDL: 75 mg/dL (ref >39)
LDL Chol Calc (NIH): 90 mg/dL (ref 0–99)
LDL/HDL Ratio: 1.2 ratio (ref 0.0–3.2)
Triglycerides: 52 mg/dL (ref 0–149)
VLDL Cholesterol Cal: 10 mg/dL (ref 5–40)

## 2021-03-10 LAB — HEPATITIS C ANTIBODY: Hep C Virus Ab: <0.1 s/co ratio (ref 0.0–0.9)

## 2021-03-11 ENCOUNTER — Ambulatory Visit: Payer: Medicare HMO | Admitting: Nurse Practitioner

## 2021-03-11 ENCOUNTER — Ambulatory Visit (INDEPENDENT_AMBULATORY_CARE_PROVIDER_SITE_OTHER): Payer: Medicare HMO | Admitting: Nurse Practitioner

## 2021-03-11 ENCOUNTER — Encounter: Payer: Self-pay | Admitting: Nurse Practitioner

## 2021-03-11 ENCOUNTER — Other Ambulatory Visit: Payer: Self-pay

## 2021-03-11 VITALS — BP 140/80 | HR 106 | Ht 65.0 in | Wt 185.1 lb

## 2021-03-11 DIAGNOSIS — I10 Essential (primary) hypertension: Secondary | ICD-10-CM | POA: Diagnosis not present

## 2021-03-11 DIAGNOSIS — I4891 Unspecified atrial fibrillation: Secondary | ICD-10-CM

## 2021-03-11 DIAGNOSIS — E785 Hyperlipidemia, unspecified: Secondary | ICD-10-CM

## 2021-03-11 DIAGNOSIS — R7303 Prediabetes: Secondary | ICD-10-CM

## 2021-03-11 NOTE — Assessment & Plan Note (Signed)
DASH diet and commitment to daily physical activity for a minimum of 30 minutes discussed and encouraged, as a part of hypertension management. The importance of attaining a healthy weight is also discussed.  BP/Weight 03/11/2021 09/08/2020 06/16/2020 05/05/2020 04/22/2020 11/14/8719 07/13/7274  Systolic BP 184 859 276 394 320 037 944  Diastolic BP 80 88 84 86 70 98 88  Wt. (Lbs) 185.08 180 180.38 180.4 180 177 179  BMI 30.8 29.5 30.02 30.02 29.95 29.45 29.79  continue current meds.

## 2021-03-11 NOTE — Assessment & Plan Note (Signed)
Lab Results  Component Value Date   HGBA1C 5.6 03/09/2021  avoid sugars, sweet, soda

## 2021-03-11 NOTE — Assessment & Plan Note (Signed)
Takes eliquis 5mg  tablets cardizem 300mg  daily followed by cardiology.

## 2021-03-11 NOTE — Patient Instructions (Addendum)
Please get your COVID booster and TDAP vaccine at your pharmacy. Please get your labs done 3-5 days before your next appointment.    It is important that you exercise regularly at least 30 minutes 5 times a week.  Think about what you will eat, plan ahead. Choose " clean, green, fresh or frozen" over canned, processed or packaged foods which are more sugary, salty and fatty. 70 to 75% of food eaten should be vegetables and fruit. Three meals at set times with snacks allowed between meals, but they must be fruit or vegetables. Aim to eat over a 12 hour period , example 7 am to 7 pm, and STOP after  your last meal of the day. Drink water,generally about 64 ounces per day, no other drink is as healthy. Fruit juice is best enjoyed in a healthy way, by EATING the fruit.  Thanks for choosing [facility], we consider it a privelige to serve you.

## 2021-03-11 NOTE — Assessment & Plan Note (Signed)
Condition stable Continue atorvastatin 40mg  daily.

## 2021-03-11 NOTE — Progress Notes (Signed)
° °  JIAH BARI     MRN: [id number]      DOB: May 01, 1945   HPI Ms. Taylor is here for follow up and re-evaluation of chronic medical conditions, medication management and review of any available recent lab and radiology data.  Preventive health is updated, specifically  Cancer screening and Immunization.   Questions or concerns regarding consultations or procedures which the PT has had in the interim are  addressed. The PT denies any adverse reactions to current medications since the last visit.  There are no new concerns.  There are no specific complaints    Due for COVID booster and TDAP vaccine, pt will get vaccines at her pharmacy.  ROS Denies recent fever or chills. Denies sinus pressure, nasal congestion, ear pain or sore throat. Denies chest congestion, productive cough or wheezing. Denies chest pains, palpitations and leg swelling Denies abdominal pain, nausea, vomiting,diarrhea or constipation.   Denies dysuria, frequency, hesitancy or incontinence. Denies joint pain, swelling and limitation in mobility. Denies headaches, seizures, numbness, or tingling. Denies depression, anxiety or insomnia. Denies skin break down or rash.   PE  BP 140/80 (BP Location: Right Arm, Cuff Size: Normal)    Pulse (!) 106    Ht 5\' 5"  (1.651 m)    Wt 185 lb 1.3 oz (84 kg)    SpO2 98%    BMI 30.80 kg/m   Patient alert and oriented and in no cardiopulmonary distress.  HEENT: No facial asymmetry, EOMI,     Neck supple .  Chest: Clear to auscultation bilaterally.  CVS: S1, S2 no murmurs, no S3.Irregular rate.  ABD: Soft non tender.   Ext: No edema  MS: Adequate ROM spine, shoulders, hips and knees.  Skin: Intact, no ulcerations or rash noted.  Psych: Good eye contact, normal affect. Memory intact not anxious or depressed appearing.  CNS: CN 2-12 intact, power,  normal throughout.no focal deficits noted.   Assessment & Plan

## 2021-03-11 NOTE — Progress Notes (Signed)
° °  Sara Terry     MRN: [id number]      DOB: [birth year]   HPI Sara Terry is here for follow up and re-evaluation of chronic medical conditions, medication management and review of any available recent lab and radiology data.  Preventive health is updated, specifically  Cancer screening and Immunization.   Questions or concerns regarding consultations or procedures which the PT has had in the interim are  addressed. The PT denies any adverse reactions to current medications since the last visit.  There are no new concerns.  There are no specific complaints   ROS Denies recent fever or chills. Denies sinus pressure, nasal congestion, ear pain or sore throat. Denies chest congestion, productive cough or wheezing. Denies chest pains, palpitations and leg swelling Denies abdominal pain, nausea, vomiting,diarrhea or constipation.   Denies dysuria, frequency, hesitancy or incontinence. Denies joint pain, swelling and limitation in mobility. Denies headaches, seizures, numbness, or tingling. Denies depression, anxiety or insomnia. Denies skin break down or rash.     PE  BP 140/80 (BP Location: Right Arm, Cuff Size: Normal)    Pulse (!) 106    Ht 5\' 5"  (1.651 m)    Wt 185 lb 1.3 oz (84 kg)    SpO2 98%    BMI 30.80 kg/m   Patient alert and oriented and in no cardiopulmonary distress.  HEENT: No facial asymmetry, EOMI,     Neck supple .  Chest: Clear to auscultation bilaterally.  CVS: S1, S2 no murmurs, no S3.Irregular rate.  ABD: Soft non tender.   Ext: No edema  MS: Adequate ROM spine, shoulders, hips and knees.  Skin: Intact, no ulcerations or rash noted.  Psych: Good eye contact, normal affect. Memory intact not anxious or depressed appearing.  CNS: CN 2-12 intact, power,  normal throughout.no focal deficits noted.   Assessment & Plan

## 2021-03-12 ENCOUNTER — Encounter: Payer: Self-pay | Admitting: Podiatry

## 2021-03-12 ENCOUNTER — Ambulatory Visit: Payer: Medicare HMO | Admitting: Podiatry

## 2021-03-12 DIAGNOSIS — Q828 Other specified congenital malformations of skin: Secondary | ICD-10-CM

## 2021-03-12 DIAGNOSIS — M778 Other enthesopathies, not elsewhere classified: Secondary | ICD-10-CM

## 2021-03-15 NOTE — Progress Notes (Signed)
Subjective:   Patient ID: Sara Terry, female   DOB: 76 y.o.   MRN: [id number]   HPI Patient states that she is done well for about the last 8 9 months and is just gradually reoccurred and is sore to walk on.  States the fluid has developed again and she did well with her treatment last time   ROS      Objective:  Physical Exam  Neuro vascular status intact with inflammation of the right fifth MPJ with fluid buildup of this area and pain with palpation.  Patient has lesion x2 plantar right     Assessment:  Inflammatory capsulitis fifth MPJ right fluid buildup with probable tailor's bunion deformity along with lesion formation x2 plantar right with a lucent type course consistent with porokeratosis     Plan:  Sterile prep injected the fifth MPJ 3 mg dexamethasone Kenalog 5 mg Xylocaine debrided 2 separate distinct lesions with no iatrogenic bleeding reappoint to recheck and may ultimately require surgery that I educated her on today

## 2021-03-21 ENCOUNTER — Encounter: Payer: Self-pay | Admitting: Nurse Practitioner

## 2021-04-15 ENCOUNTER — Encounter: Payer: Self-pay | Admitting: Cardiology

## 2021-06-09 ENCOUNTER — Encounter: Payer: Self-pay | Admitting: Cardiology

## 2021-06-10 ENCOUNTER — Other Ambulatory Visit: Payer: Self-pay | Admitting: *Deleted

## 2021-06-10 MED ORDER — FUROSEMIDE 20 MG PO TABS: 20.0000 mg | ORAL_TABLET | ORAL | 11 refills | Status: DC | PRN

## 2021-06-22 ENCOUNTER — Ambulatory Visit (INDEPENDENT_AMBULATORY_CARE_PROVIDER_SITE_OTHER): Payer: Medicare HMO | Admitting: Family Medicine

## 2021-06-22 ENCOUNTER — Encounter: Payer: Self-pay | Admitting: Family Medicine

## 2021-06-22 VITALS — BP 122/76 | HR 110 | Ht 65.0 in | Wt 185.4 lb

## 2021-06-22 DIAGNOSIS — M179 Osteoarthritis of knee, unspecified: Secondary | ICD-10-CM | POA: Diagnosis not present

## 2021-06-22 DIAGNOSIS — E785 Hyperlipidemia, unspecified: Secondary | ICD-10-CM | POA: Diagnosis not present

## 2021-06-22 DIAGNOSIS — K1121 Acute sialoadenitis: Secondary | ICD-10-CM | POA: Diagnosis not present

## 2021-06-22 DIAGNOSIS — I10 Essential (primary) hypertension: Secondary | ICD-10-CM | POA: Diagnosis not present

## 2021-06-22 MED ORDER — SULFAMETHOXAZOLE-TRIMETHOPRIM 800-160 MG PO TABS: 1.0000 | ORAL_TABLET | Freq: Two times a day (BID) | ORAL | 0 refills | Status: AC

## 2021-06-22 NOTE — Assessment & Plan Note (Signed)
Controlled on antihypertensive treatments ?

## 2021-06-22 NOTE — Patient Instructions (Addendum)
I appreciate the opportunity to provide care to you today! ?  ?Please pick up your antibiotic at the pharmacy and complete the course of the treatments ? ?No refills today ? ?  ?It was a pleasure to see you and I look forward to continuing to work together on your health and well-being. ?Please do not hesitate to call the office if you need care or have questions about your care. ?  ?Have a wonderful day and week. ?With Gratitude, ?Alvira Monday MSN, FNP-BC ? ?

## 2021-06-22 NOTE — Progress Notes (Signed)
? ?Established Patient Office Visit ? ?Subjective:  ?Patient ID: Sara Terry, female    DOB: January 14, 1946  Age: 76 y.o. MRN: [id number] ? ?CC:  ?Chief Complaint  ?Patient presents with  ? Facial Swelling  ?  Pt has ear swelling on right side.Has facial swelling. Onset was yesterday.   ? ? ?HPI ?Sara Terry 76 y.o.is a  female with a PMH of HTN, Hyperlipidemia, prediabetes, and osteoarthritis and presents for f/u of chronic medical conditions. The patient complies with her medications and voices no concerns or complaints about her chronic medical conditions.  ?Today the patient reports right-sided facial swelling at the parotid gland that started on 06/21/2021. The site is tender but not painful, and the patient says the swelling has increased since its onset. She denies, headaches, redness, pain,  fever, N/V, and recent sick contacts.  ? ?Past Medical History:  ?Diagnosis Date  ? Arthritis   ? right hip and right knee (had right hip replacement)  ? Atrial fibrillation ([place])   ? Essential hypertension   ? GERD (gastroesophageal reflux disease)   ? Glucose intolerance (impaired glucose tolerance)   ? Insomnia   ? Raynaud phenomenon   ? Skin cancer   ? Forehead  ? ? ?Past Surgical History:  ?Procedure Laterality Date  ? ABDOMINOPLASTY    ? BREAST REDUCTION SURGERY  2003  ? COLONOSCOPY    ? COLONOSCOPY N/A 06/13/2013  ? Procedure: COLONOSCOPY;  Surgeon: Rogene Houston, MD;  [facility];  Service: Endoscopy;  Laterality: N/A;  930  ? FOOT SURGERY Left 02/09/2016  ? TONSILLECTOMY    ? TOTAL HIP ARTHROPLASTY Right 04/27/2016  ? Procedure: RIGHT TOTAL HIP ARTHROPLASTY ANTERIOR APPROACH;  Surgeon: Gaynelle Arabian, MD;  Location: [facility];  Service: Orthopedics;  Laterality: Right;  ? TUBAL LIGATION    ? ? ?Family History  ?Problem Relation Age of Onset  ? Hypertension Mother   ? Hypertension Father   ? Diabetes Father   ? Heart attack Father   ? Hypertension Brother   ? ? ?Social History  ? ?Socioeconomic History  ?  Marital status: Married  ?  Spouse name: Not on file  ? Number of children: 2  ? Years of education: Not on file  ? Highest education level: Not on file  ?Occupational History  ? Occupation: [employer] now  ?  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[employer]  ?Tobacco Use  ? Smoking status: Former  ?  Types: Cigarettes  ?  Start date: 03/07/1964  ?  Quit date: 03/07/1978  ?  Years since quitting: 43.3  ? Smokeless tobacco: Never  ?Vaping Use  ? Vaping Use: Never used  ?Substance and Sexual Activity  ? Alcohol use: Yes  ?  Comment: 2.5 glasses of wine daily  ? Drug use: No  ? Sexual activity: Not on file  ?Other Topics Concern  ? Not on file  ?Social History Narrative  ? 1 child at [facility] and another in [place], [place]  ? ?Social Determinants of Health  ? ?Financial Resource Strain: Not on file  ?Food Insecurity: Not on file  ?Transportation Needs: Not on file  ?Physical Activity: Not on file  ?Stress: Not on file  ?Social Connections: Not on file  ?Intimate Partner Violence: Not on file  ? ? ?Outpatient Medications Prior to Visit  ?Medication Sig Dispense Refill  ? apixaban (ELIQUIS) 5 MG TABS tablet Take 1 tablet (5 mg total) by  mouth 2 (two) times daily. 60 tablet 3  ? atorvastatin (LIPITOR) 40 MG tablet TAKE 1 TABLET DAILY AT 6PM 90 tablet 3  ? calcium carbonate (OS-CAL - DOSED IN MG OF ELEMENTAL CALCIUM) 1250 (500 Ca) MG tablet Take 1 tablet by mouth 2 (two) times daily with a meal.     ? diltiazem (CARDIZEM CD) 300 MG 24 hr capsule TAKE 1 CAPSULE DAILY 90 capsule 3  ? docusate sodium (COLACE) 100 MG capsule Take 100 mg by mouth daily.    ? enalapril (VASOTEC) 20 MG tablet Take 1 tablet (20 mg total) by mouth daily. 90 tablet 3  ? furosemide (LASIX) 20 MG tablet Take 1 tablet (20 mg total) by mouth as needed for edema. 30 tablet 11  ? hydrochlorothiazide (HYDRODIURIL) 25 MG tablet Take 1 tablet (25 mg total) by mouth every morning. 90 tablet 3  ? zolpidem (AMBIEN) 10 MG tablet Take 0.5 tablets  (5 mg total) by mouth at bedtime as needed for sleep. 30 tablet 0  ? ?No facility-administered medications prior to visit.  ? ? ?Allergies  ?Allergen Reactions  ? Maxzide [Triamterene-Hctz]   ? Penicillins Hives  ?  Has patient had a PCN reaction causing immediate rash, facial/tongue/throat swelling, SOB or lightheadedness with hypotension: Yes ?Has patient had a PCN reaction causing severe rash involving mucus membranes or skin necrosis: No ?Has patient had a PCN reaction that required hospitalization No ?Has patient had a PCN reaction occurring within the last 10 years: No ?If all of the above answers are "NO", then may proceed with Cephalosporin use. ?  ? ? ?ROS ?Review of Systems  ?Constitutional:  Negative for activity change, chills, fatigue and fever.  ?HENT:  Negative for sinus pressure, sinus pain and sore throat.   ?Eyes:  Negative for pain, discharge and itching.  ?Cardiovascular:  Negative for chest pain, palpitations and leg swelling.  ?Gastrointestinal:  Negative for constipation, diarrhea, nausea and vomiting.  ?Endocrine: Negative for polydipsia, polyphagia and polyuria.  ?Genitourinary:  Negative for frequency, hematuria and urgency.  ?Musculoskeletal:  Negative for arthralgias, back pain and neck pain.  ?Skin:  Negative for rash and wound.  ?Neurological:  Negative for dizziness, weakness, numbness and headaches.  ?Hematological:  Negative for adenopathy. Does not bruise/bleed easily.  ?Psychiatric/Behavioral:  Negative for confusion and suicidal ideas.   ? ?  ?Objective:  ?  ?Physical Exam ?Constitutional:   ?   Appearance: Normal appearance.  ?HENT:  ?   Head: Normocephalic. No masses.  ? ?   Comments: Swelling noted at the parotid gland, with no redness and pain. The site is tender to palpate.  ?   Right Ear: External ear normal.  ?   Left Ear: External ear normal.  ?   Mouth/Throat:  ?   Mouth: Mucous membranes are moist.  ?Eyes:  ?   General:     ?   Right eye: No discharge.     ?   Left eye:  No discharge.  ?Cardiovascular:  ?   Rate and Rhythm: Normal rate and regular rhythm.  ?   Pulses: Normal pulses.  ?   Heart sounds: Normal heart sounds.  ?Pulmonary:  ?   Effort: Pulmonary effort is normal.  ?   Breath sounds: Normal breath sounds.  ?Musculoskeletal:  ?   Cervical back: Normal range of motion.  ?   Right lower leg: No edema.  ?Skin: ?   Coloration: Skin is not jaundiced.  ?   Findings:  No bruising.  ?Neurological:  ?   Mental Status: She is alert and oriented to person, place, and time.  ?Psychiatric:  ?   Comments: Normal affect  ? ? ?BP 122/76 (BP Location: Left Arm)   Pulse (!) 110   Ht '5\' 5"'  (1.651 m)   Wt 185 lb 6.4 oz (84.1 kg)   SpO2 98%   BMI 30.85 kg/m?  ?Wt Readings from Last 3 Encounters:  ?06/22/21 185 lb 6.4 oz (84.1 kg)  ?03/11/21 185 lb 1.3 oz (84 kg)  ?09/08/20 180 lb (81.6 kg)  ? ? ?Lab Results  ?Component Value Date  ? TSH 1.471 11/08/2015  ? ?Lab Results  ?Component Value Date  ? WBC 4.9 03/09/2021  ? HGB 13.0 03/09/2021  ? HCT 36.4 03/09/2021  ? MCV 95 03/09/2021  ? PLT 310 03/09/2021  ? ?Lab Results  ?Component Value Date  ? NA 139 03/09/2021  ? K 4.5 03/09/2021  ? CO2 24 03/09/2021  ? GLUCOSE 122 (H) 03/09/2021  ? BUN 13 03/09/2021  ? CREATININE 0.62 03/09/2021  ? BILITOT 0.3 03/09/2021  ? ALKPHOS 75 03/09/2021  ? AST 17 03/09/2021  ? ALT 12 03/09/2021  ? PROT 6.0 03/09/2021  ? ALBUMIN 4.2 03/09/2021  ? CALCIUM 8.9 03/09/2021  ? ANIONGAP 8 04/12/2018  ? EGFR 93 03/09/2021  ? ?Lab Results  ?Component Value Date  ? CHOL 175 03/09/2021  ? ?Lab Results  ?Component Value Date  ? HDL 75 03/09/2021  ? ?Lab Results  ?Component Value Date  ? [place] 90 03/09/2021  ? ?Lab Results  ?Component Value Date  ? TRIG 52 03/09/2021  ? ?Lab Results  ?Component Value Date  ? CHOLHDL 1.8 05/05/2020  ? ?Lab Results  ?Component Value Date  ? HGBA1C 5.6 03/09/2021  ? ? ?  ?Assessment & Plan:  ? ?Problem List Items Addressed This Visit   ? ?  ? Cardiovascular and Mediastinum  ? Essential  hypertension, benign  ?  Controlled on antihypertensive treatments ? ?  ?  ?  ? Digestive  ? Parotitis, acute  ?  -Right side swelling of the parotid glands with no lymphadenopathy  ?-Given the patient's allergies t

## 2021-06-22 NOTE — Assessment & Plan Note (Signed)
-  Right side swelling of the parotid glands with no lymphadenopathy  ?-Given the patient's allergies to penicillin, bactrim ordered ? ?

## 2021-06-22 NOTE — Assessment & Plan Note (Signed)
Stable on lipitor

## 2021-06-22 NOTE — Assessment & Plan Note (Signed)
-   no complaint of joint pain and stiffness ?

## 2021-06-25 ENCOUNTER — Encounter: Payer: Self-pay | Admitting: Cardiology

## 2021-06-25 ENCOUNTER — Ambulatory Visit: Payer: Medicare HMO | Admitting: Cardiology

## 2021-06-25 VITALS — BP 118/80 | HR 112 | Ht 65.0 in | Wt 185.2 lb

## 2021-06-25 DIAGNOSIS — I4821 Permanent atrial fibrillation: Secondary | ICD-10-CM | POA: Diagnosis not present

## 2021-06-25 DIAGNOSIS — I10 Essential (primary) hypertension: Secondary | ICD-10-CM

## 2021-06-25 DIAGNOSIS — R600 Localized edema: Secondary | ICD-10-CM | POA: Diagnosis not present

## 2021-06-25 MED ORDER — FUROSEMIDE 20 MG PO TABS: 20.0000 mg | ORAL_TABLET | ORAL | 11 refills | Status: DC | PRN

## 2021-06-25 MED ORDER — DILTIAZEM HCL ER COATED BEADS 360 MG PO CP24: 360.0000 mg | ORAL_CAPSULE | Freq: Every day | ORAL | 1 refills | Status: DC

## 2021-06-25 NOTE — Progress Notes (Signed)
? ? ?Cardiology Office Note ? ?Date: 06/25/2021  ? ?ID: Sara Terry, DOB 06/11/1945, MRN [id number] ? ?PCP:  Renee Rival, FNP  ?Cardiologist:  Rozann Lesches, MD ?Electrophysiologist:  None  ? ?Chief Complaint  ?Patient presents with  ? Cardiac follow-up  ? ? ?History of Present Illness: ?Sara Terry is a 76 y.o. female last seen in February 2022.  She presents to the office for evaluation reporting intermittent leg swelling.  We prescribed as needed low-dose Lasix which has helped her leg edema.  She otherwise does not report any sense of palpitations and atrial fibrillation and has been taking her medications regularly. ? ?Heart rate is elevated today, I personally reviewed her ECG which shows atrial fibrillation at 120 bpm.  Heart rate still over 100 after being seated for several minutes.  We discussed up titration of Cardizem CD to 360 mg daily. ? ?She has not had a recent echocardiogram, LVEF was approximately 60 to 65% as of 2017.  An updated study will be obtained as well to exclude cardiomyopathy. ? ?Past Medical History:  ?Diagnosis Date  ? Arthritis   ? right hip and right knee (had right hip replacement)  ? Atrial fibrillation ([place])   ? Essential hypertension   ? GERD (gastroesophageal reflux disease)   ? Glucose intolerance (impaired glucose tolerance)   ? Insomnia   ? Raynaud phenomenon   ? Skin cancer   ? Forehead  ? ? ?Past Surgical History:  ?Procedure Laterality Date  ? ABDOMINOPLASTY    ? BREAST REDUCTION SURGERY  2003  ? COLONOSCOPY    ? COLONOSCOPY N/A 06/13/2013  ? Procedure: COLONOSCOPY;  Surgeon: Rogene Houston, MD;  [facility];  Service: Endoscopy;  Laterality: N/A;  930  ? FOOT SURGERY Left 02/09/2016  ? TONSILLECTOMY    ? TOTAL HIP ARTHROPLASTY Right 04/27/2016  ? Procedure: RIGHT TOTAL HIP ARTHROPLASTY ANTERIOR APPROACH;  Surgeon: Gaynelle Arabian, MD;  Location: [facility];  Service: Orthopedics;  Laterality: Right;  ? TUBAL LIGATION    ? ? ?Current Outpatient Medications   ?Medication Sig Dispense Refill  ? apixaban (ELIQUIS) 5 MG TABS tablet Take 1 tablet (5 mg total) by mouth 2 (two) times daily. 60 tablet 3  ? atorvastatin (LIPITOR) 40 MG tablet TAKE 1 TABLET DAILY AT 6PM 90 tablet 3  ? calcium carbonate (OS-CAL - DOSED IN MG OF ELEMENTAL CALCIUM) 1250 (500 Ca) MG tablet Take 1 tablet by mouth 2 (two) times daily with a meal.     ? diltiazem (CARDIZEM CD) 300 MG 24 hr capsule TAKE 1 CAPSULE DAILY 90 capsule 3  ? diltiazem (CARDIZEM CD) 360 MG 24 hr capsule Take 1 capsule (360 mg total) by mouth daily. 90 capsule 1  ? docusate sodium (COLACE) 100 MG capsule Take 100 mg by mouth daily.    ? enalapril (VASOTEC) 20 MG tablet Take 1 tablet (20 mg total) by mouth daily. 90 tablet 3  ? hydrochlorothiazide (HYDRODIURIL) 25 MG tablet Take 1 tablet (25 mg total) by mouth every morning. 90 tablet 3  ? sulfamethoxazole-trimethoprim (BACTRIM DS) 800-160 MG tablet Take 1 tablet by mouth 2 (two) times daily for 7 days. 14 tablet 0  ? furosemide (LASIX) 20 MG tablet Take 1 tablet (20 mg total) by mouth as needed for edema. 30 tablet 11  ? zolpidem (AMBIEN) 10 MG tablet Take 0.5 tablets (5 mg total) by mouth at bedtime as needed for sleep. 30 tablet 0  ? ?No current  facility-administered medications for this visit.  ? ?Allergies:  Maxzide [triamterene-hctz] and Penicillins  ? ?ROS: No orthopnea or PND. ? ?Physical Exam: ?VS:  BP 118/80   Pulse (!) 112   Ht '5\' 5"'$  (1.651 m)   Wt 185 lb 3.2 oz (84 kg)   SpO2 98%   BMI 30.82 kg/m? , BMI Body mass index is 30.82 kg/m?. ? ?Wt Readings from Last 3 Encounters:  ?06/25/21 185 lb 3.2 oz (84 kg)  ?06/22/21 185 lb 6.4 oz (84.1 kg)  ?03/11/21 185 lb 1.3 oz (84 kg)  ?  ?General: Patient appears comfortable at rest. ?HEENT: Conjunctiva and lids normal. ?Lungs: Clear to auscultation, nonlabored breathing at rest. ?Cardiac: Irregularly irregular, no S3 or significant systolic murmur. ?Extremities: No pitting edema. ? ?ECG:  An ECG dated 04/22/2020 was  personally reviewed today and demonstrated:  Atrial fibrillation. ? ?Recent Labwork: ?03/09/2021: ALT 12; AST 17; BUN 13; Creatinine, Ser 0.62; Hemoglobin 13.0; Platelets 310; Potassium 4.5; Sodium 139  ?   ?Component Value Date/Time  ? CHOL 175 03/09/2021 0824  ? TRIG 52 03/09/2021 0824  ? HDL 75 03/09/2021 0824  ? CHOLHDL 1.8 05/05/2020 1053  ? CHOLHDL 2.2 04/12/2018 0943  ? VLDL 7 04/12/2018 0943  ? [place] 90 03/09/2021 0824  ? ? ?Other Studies Reviewed Today: ? ?Echocardiogram 11/10/2015: ?- Left ventricle: The cavity size was normal. Wall thickness was  ?  normal. Systolic function was normal. The estimated ejection  ?  fraction was in the range of 60% to 65%. Wall motion was normal;  ?  there were no regional wall motion abnormalities. The study is  ?  not technically sufficient to allow evaluation of LV diastolic  ?  function.  ?- Mitral valve: Mildly calcified annulus. There was moderate  ?  regurgitation.  ?- Left atrium: The atrium was moderately dilated.  ?- Right ventricle: Systolic function was mildly reduced.  ?- Right atrium: The atrium was moderately dilated.  ?- Tricuspid valve: There was moderate regurgitation.  ? ?Assessment and Plan: ? ?1.  Permanent atrial fibrillation with CHA2DS2-VASc score of 4-5.  Heart rate is not well controlled, will increase Cardizem CD to 360 mg daily.  Continue Eliquis for stroke prophylaxis.  ? ?2.  Intermittent leg swelling.  She has Lasix 20 mg to use as needed.  If this becomes something that is required frequently, can start this on a standing basis and discontinue HCTZ.  We will obtain an echocardiogram to ensure no decrease in LVEF. ? ?3.  Essential hypertension, blood pressure well controlled today.  If blood pressure decreases after increasing Cardizem CD dose (being used as a heart rate control measure) then Vasotec could be cut back. ? ?Medication Adjustments/Labs and Tests Ordered: ?Current medicines are reviewed at length with the patient today.  Concerns  regarding medicines are outlined above.  ? ?Tests Ordered: ?Orders Placed This Encounter  ?Procedures  ? EKG 12-Lead  ? ECHOCARDIOGRAM COMPLETE  ? ? ?Medication Changes: ?Meds ordered this encounter  ?Medications  ? diltiazem (CARDIZEM CD) 360 MG 24 hr capsule  ?  Sig: Take 1 capsule (360 mg total) by mouth daily.  ?  Dispense:  90 capsule  ?  Refill:  1  ?  06/25/21 Dose Increase  ? furosemide (LASIX) 20 MG tablet  ?  Sig: Take 1 tablet (20 mg total) by mouth as needed for edema.  ?  Dispense:  30 tablet  ?  Refill:  11  ? ? ?Disposition:  Follow  up  3 months. ? ?Signed, ?Satira Sark, MD, [facility] ?06/25/2021 2:33 PM    ?[facility] ?[address], [place], [place] [address] ?Phone: [phone]; Fax: [phone]  ?

## 2021-06-25 NOTE — Patient Instructions (Addendum)
Medication Instructions:  ?Your physician has recommended you make the following change in your medication:  ?Increase Cardizem to 360 mg once a day ?Take Lasix as needed ? ?Labwork: ?none ? ?Testing/Procedures: ?Your physician has requested that you have an echocardiogram. Echocardiography is a painless test that uses sound waves to create images of your heart. It provides your doctor with information about the size and shape of your heart and how well your heart?s chambers and valves are working. This procedure takes approximately one hour. There are no restrictions for this procedure.  ? ?Follow-Up: ?Your physician recommends that you schedule a follow-up appointment in: 3 months ? ?Any Other Special Instructions Will Be Listed Below (If Applicable). ? ?If you need a refill on your cardiac medications before your next appointment, please call your pharmacy. ? ?

## 2021-06-30 ENCOUNTER — Ambulatory Visit: Payer: Medicare HMO | Admitting: Cardiology

## 2021-07-09 ENCOUNTER — Ambulatory Visit (HOSPITAL_COMMUNITY)
Admission: RE | Admit: 2021-07-09 | Discharge: 2021-07-09 | Disposition: A | Discharge status: Home / Self Care | Payer: Medicare HMO | Source: Ambulatory Visit | Attending: Cardiology | Admitting: Cardiology

## 2021-07-09 DIAGNOSIS — I4821 Permanent atrial fibrillation: Secondary | ICD-10-CM | POA: Diagnosis not present

## 2021-07-09 LAB — ECHOCARDIOGRAM COMPLETE
AR max vel: 1.61 cm2
AV Area VTI: 1.7 cm2
AV Area mean vel: 1.62 cm2
AV Mean grad: 7 mmHg
AV Peak grad: 13.4 mmHg
Ao pk vel: 1.83 m/s
Area-P 1/2: 3.53 cm2
Calc EF: 61.9 %
MV M vel: 4.47 m/s
MV Peak grad: 79.9 mmHg
S' Lateral: 2.5 cm
Single Plane A2C EF: 59 %
Single Plane A4C EF: 63.9 %

## 2021-07-16 ENCOUNTER — Encounter: Payer: Self-pay | Admitting: Podiatry

## 2021-07-16 ENCOUNTER — Ambulatory Visit: Payer: Medicare HMO | Admitting: Podiatry

## 2021-07-16 DIAGNOSIS — Q828 Other specified congenital malformations of skin: Secondary | ICD-10-CM | POA: Diagnosis not present

## 2021-07-16 DIAGNOSIS — M778 Other enthesopathies, not elsewhere classified: Secondary | ICD-10-CM | POA: Diagnosis not present

## 2021-07-16 DIAGNOSIS — M21621 Bunionette of right foot: Secondary | ICD-10-CM

## 2021-07-16 NOTE — Progress Notes (Signed)
Subjective:  ? ?Patient ID: Sara Terry, female   DOB: 76 y.o.   MRN: [id number]  ? ?HPI ?Patient presents with painful callus plantar aspect right foot stating she only got about 4 months relief this time ? ? ?ROS ? ? ?   ?Objective:  ?Physical Exam  ?Neurovascular status intact exquisite discomfort on the head of the fifth metatarsal right with lesion formation with fluid buildup ? ?   ?Assessment:  ?Significant tailor's bunion deformity fifth MPJ right with history of having had surgery on the left 1 that did very well with lesion formation ? ?   ?Plan:  ?H&P discussed condition at great length and at 1 point we may have to consider surgery as the other one did require surgery.  At this point organ to try deep debridement technique along with padding and if it does not get better surgical fifth metatarsal head resection will be necessary ?   ? ? ?

## 2021-07-21 ENCOUNTER — Other Ambulatory Visit: Payer: Self-pay | Admitting: *Deleted

## 2021-07-21 ENCOUNTER — Encounter: Payer: Self-pay | Admitting: Cardiology

## 2021-07-21 MED ORDER — HYDROCHLOROTHIAZIDE 25 MG PO TABS: 25.0000 mg | ORAL_TABLET | Freq: Every morning | ORAL | 3 refills | Status: DC

## 2021-08-03 DIAGNOSIS — E785 Hyperlipidemia, unspecified: Secondary | ICD-10-CM | POA: Diagnosis not present

## 2021-08-03 DIAGNOSIS — R7303 Prediabetes: Secondary | ICD-10-CM | POA: Diagnosis not present

## 2021-08-03 DIAGNOSIS — I10 Essential (primary) hypertension: Secondary | ICD-10-CM | POA: Diagnosis not present

## 2021-08-04 ENCOUNTER — Encounter: Payer: Self-pay | Admitting: Cardiology

## 2021-08-04 ENCOUNTER — Other Ambulatory Visit: Payer: Self-pay | Admitting: *Deleted

## 2021-08-04 LAB — CMP14+EGFR
ALT: 12 IU/L (ref 0–32)
AST: 16 IU/L (ref 0–40)
Albumin/Globulin Ratio: 2.3 — ABNORMAL HIGH (ref 1.2–2.2)
Albumin: 4.3 g/dL (ref 3.7–4.7)
Alkaline Phosphatase: 72 IU/L (ref 44–121)
BUN/Creatinine Ratio: 21 (ref 12–28)
BUN: 14 mg/dL (ref 8–27)
Bilirubin Total: 0.4 mg/dL (ref 0.0–1.2)
CO2: 23 mmol/L (ref 20–29)
Calcium: 9.4 mg/dL (ref 8.7–10.3)
Chloride: 98 mmol/L (ref 96–106)
Creatinine, Ser: 0.67 mg/dL (ref 0.57–1.00)
Globulin, Total: 1.9 g/dL (ref 1.5–4.5)
Glucose: 127 mg/dL — ABNORMAL HIGH (ref 70–99)
Potassium: 4.7 mmol/L (ref 3.5–5.2)
Sodium: 137 mmol/L (ref 134–144)
Total Protein: 6.2 g/dL (ref 6.0–8.5)
eGFR: 92 mL/min/{1.73_m2} (ref >59)

## 2021-08-04 LAB — LIPID PANEL
Chol/HDL Ratio: 2.1 ratio (ref 0.0–4.4)
Cholesterol, Total: 154 mg/dL (ref 100–199)
HDL: 72 mg/dL (ref >39)
LDL Chol Calc (NIH): 71 mg/dL (ref 0–99)
Triglycerides: 54 mg/dL (ref 0–149)
VLDL Cholesterol Cal: 11 mg/dL (ref 5–40)

## 2021-08-04 LAB — HEMOGLOBIN A1C
Est. average glucose Bld gHb Est-mCnc: 120 mg/dL
Hgb A1c MFr Bld: 5.8 % — ABNORMAL HIGH (ref 4.8–5.6)

## 2021-08-04 MED ORDER — ENALAPRIL MALEATE 20 MG PO TABS: 20.0000 mg | ORAL_TABLET | Freq: Every day | ORAL | 3 refills | Status: DC

## 2021-08-04 MED ORDER — APIXABAN 5 MG PO TABS: 5.0000 mg | ORAL_TABLET | Freq: Two times a day (BID) | ORAL | 1 refills | Status: DC

## 2021-08-04 NOTE — Progress Notes (Signed)
Has upcoming appointment with me I will discuss results with her at that visit

## 2021-08-04 NOTE — Telephone Encounter (Signed)
Prescription refill request for Eliquis received. Indication: Atrial Fib Last office visit: 06/25/21  Myles Gip MD Scr: 0.67 on 08/03/21 Age: 77 Weight: 84kg  Based on above findings Eliquis '5mg'$  twice daily is t he appropriate dose.  Refill approved.

## 2021-08-05 DIAGNOSIS — D3131 Benign neoplasm of right choroid: Secondary | ICD-10-CM | POA: Diagnosis not present

## 2021-08-09 ENCOUNTER — Ambulatory Visit: Payer: Medicare HMO | Admitting: Nurse Practitioner

## 2021-08-10 ENCOUNTER — Encounter: Payer: Self-pay | Admitting: Nurse Practitioner

## 2021-08-10 ENCOUNTER — Ambulatory Visit (INDEPENDENT_AMBULATORY_CARE_PROVIDER_SITE_OTHER): Payer: Medicare HMO | Admitting: Nurse Practitioner

## 2021-08-10 VITALS — BP 123/85 | HR 93 | Ht 65.0 in | Wt 185.0 lb

## 2021-08-10 DIAGNOSIS — R7303 Prediabetes: Secondary | ICD-10-CM | POA: Diagnosis not present

## 2021-08-10 DIAGNOSIS — Z139 Encounter for screening, unspecified: Secondary | ICD-10-CM | POA: Diagnosis not present

## 2021-08-10 DIAGNOSIS — F5101 Primary insomnia: Secondary | ICD-10-CM | POA: Diagnosis not present

## 2021-08-10 DIAGNOSIS — I4891 Unspecified atrial fibrillation: Secondary | ICD-10-CM | POA: Diagnosis not present

## 2021-08-10 DIAGNOSIS — I10 Essential (primary) hypertension: Secondary | ICD-10-CM | POA: Diagnosis not present

## 2021-08-10 DIAGNOSIS — E782 Mixed hyperlipidemia: Secondary | ICD-10-CM

## 2021-08-10 DIAGNOSIS — R600 Localized edema: Secondary | ICD-10-CM | POA: Diagnosis not present

## 2021-08-10 DIAGNOSIS — R69 Illness, unspecified: Secondary | ICD-10-CM | POA: Diagnosis not present

## 2021-08-10 NOTE — Assessment & Plan Note (Signed)
BP Readings from Last 3 Encounters:  08/10/21 123/85  06/25/21 118/80  06/22/21 122/76  Chronic condition well-controlled on enalapril 20 mg daily, Cardizem 360 mg daily, hydrochlorothiazide 25 mg daily Continue current medications DASH diet advised engage in regular exercises at least for 150 minutes weekly as tolerated follow-up in 6 months

## 2021-08-10 NOTE — Assessment & Plan Note (Signed)
Chronic condition well-controlled on Ambien 5 mg as needed States that she rarely takes medication Side effect of medication including confusion, being addicted to medication discussed with patient.

## 2021-08-10 NOTE — Assessment & Plan Note (Signed)
Take Lasix 20 mg daily as needed Patient encouraged to wear compression socks and keep legs elevated with to help with swelling. No edema noted today

## 2021-08-10 NOTE — Assessment & Plan Note (Signed)
Lab Results  Component Value Date   CHOL 154 08/03/2021   HDL 72 08/03/2021   LDLCALC 71 08/03/2021   TRIG 54 08/03/2021   CHOLHDL 2.1 08/03/2021  Currently on atorvastatin 40 mg daily Condition well-controlled Continue current medication

## 2021-08-10 NOTE — Progress Notes (Signed)
   Sara Terry     MRN: [id number]      DOB: 08/23/1945   HPI Sara Terry with past medical history of hypertension, prediabetes, A-fib, hyperlipidemia, insomnia is here for follow up and re-evaluation of chronic medical conditions, medication management and review of any available recent lab . The PT denies any adverse reactions to current medications since the last visit.  .   A-fib .currently taking Eliquis 5 mg twice daily,, Cardizem 360 mg daily, patient denies shortness of breath, dizziness, bloody stool.  Insomnia.  Currently taking Ambien 5 mg as needed for insomnia.  Patient states that she rarely takes medication and only takes it when she needs it.   Bilateral lower extremity edema . patient complains of bilateral lower extremity edema.  States that she noticed her leg becomes swelling after sitting for a long time.  No Complains of numbness tingling     ROS Denies recent fever or chills. Denies sinus pressure, nasal congestion, ear pain or sore throat. Denies chest congestion, productive cough or wheezing. Denies chest pains, palpitations  Denies abdominal pain, nausea, vomiting,diarrhea or constipation.   Denies dysuria, frequency, hesitancy or incontinence. Denies joint pain, swelling and limitation in mobility. Denies headaches, seizures, numbness, or tingling. Denies depression, anxiety or insomnia.   PE  BP 123/85 (BP Location: Right Arm, Patient Position: Sitting, Cuff Size: Large)   Pulse 93   Ht '5\' 5"'$  (1.651 m)   Wt 185 lb (83.9 kg)   SpO2 96%   BMI 30.79 kg/m   Patient alert and oriented and in no cardiopulmonary distress.  Chest: Clear to auscultation bilaterally.  CVS: S1, S2 no murmurs, no S3.irregular rate.  ABD: Soft non tender.   Ext: No edema  MS: Adequate ROM spine, shoulders, hips and knees.   Psych: Good eye contact, normal affect. Memory intact not anxious or depressed appearing.  CNS: CN 2-12 intact, power,  normal throughout.no  focal deficits noted.   Assessment & Plan  Essential hypertension, benign BP Readings from Last 3 Encounters:  08/10/21 123/85  06/25/21 118/80  06/22/21 122/76  Chronic condition well-controlled on enalapril 20 mg daily, Cardizem 360 mg daily, hydrochlorothiazide 25 mg daily Continue current medications DASH diet advised engage in regular exercises at least for 150 minutes weekly as tolerated follow-up in 6 months  New onset a-fib [facility]) Chronic condition Patient is asymptomatic Continue Cardizem 360 mg daily, Eliquis 5 mg twice daily Recently had echocardiogram done by cardiology Patient encouraged to maintain close follow-up with cardiology she verbalized understanding  HLD (hyperlipidemia) Lab Results  Component Value Date   CHOL 154 08/03/2021   HDL 72 08/03/2021   LDLCALC 71 08/03/2021   TRIG 54 08/03/2021   CHOLHDL 2.1 08/03/2021  Currently on atorvastatin 40 mg daily Condition well-controlled Continue current medication  Insomnia Chronic condition well-controlled on Ambien 5 mg as needed States that she rarely takes medication Side effect of medication including confusion, being addicted to medication discussed with patient.   Prediabetes Lab Results  Component Value Date   HGBA1C 5.8 (H) 08/03/2021  Chronic condition well-controlled Avoid sugar sweets soda  Bilateral lower extremity edema Take Lasix 20 mg daily as needed Patient encouraged to wear compression socks and keep legs elevated with to help with swelling. No edema noted today

## 2021-08-10 NOTE — Patient Instructions (Signed)

## 2021-08-10 NOTE — Assessment & Plan Note (Signed)
Lab Results  Component Value Date   HGBA1C 5.8 (H) 08/03/2021  Chronic condition well-controlled Avoid sugar sweets soda

## 2021-08-10 NOTE — Assessment & Plan Note (Signed)
Chronic condition Patient is asymptomatic Continue Cardizem 360 mg daily, Eliquis 5 mg twice daily Recently had echocardiogram done by cardiology Patient encouraged to maintain close follow-up with cardiology she verbalized understanding

## 2021-08-27 ENCOUNTER — Other Ambulatory Visit: Payer: Self-pay | Admitting: Cardiology

## 2021-09-28 ENCOUNTER — Encounter: Payer: Self-pay | Admitting: Student

## 2021-09-28 ENCOUNTER — Ambulatory Visit: Payer: Medicare HMO | Admitting: Student

## 2021-09-28 VITALS — BP 138/74 | HR 91 | Ht 65.5 in | Wt 186.6 lb

## 2021-09-28 DIAGNOSIS — I10 Essential (primary) hypertension: Secondary | ICD-10-CM

## 2021-09-28 DIAGNOSIS — E785 Hyperlipidemia, unspecified: Secondary | ICD-10-CM | POA: Diagnosis not present

## 2021-09-28 DIAGNOSIS — R600 Localized edema: Secondary | ICD-10-CM | POA: Diagnosis not present

## 2021-09-28 DIAGNOSIS — I4821 Permanent atrial fibrillation: Secondary | ICD-10-CM

## 2021-09-28 NOTE — Patient Instructions (Signed)
Medication Instructions:  ?Your physician recommends that you continue on your current medications as directed. Please refer to the Current Medication list given to you today. ? ?*If you need a refill on your cardiac medications before your next appointment, please call your pharmacy* ? ? ?Lab Work: ?NONE  ? ?If you have labs (blood work) drawn today and your tests are completely normal, you will receive your results only by: ?MyChart Message (if you have MyChart) OR ?A paper copy in the mail ?If you have any lab test that is abnormal or we need to change your treatment, we will call you to review the results. ? ? ?Testing/Procedures: ?NONE  ? ? ?Follow-Up: ?At [facility], you and your health needs are our priority.  As part of our continuing mission to provide you with exceptional heart care, we have created designated Provider Care Teams.  These Care Teams include your primary Cardiologist (physician) and Advanced Practice Providers (APPs -  Physician Assistants and Nurse Practitioners) who all work together to provide you with the care you need, when you need it. ? ?We recommend signing up for the patient portal called "MyChart".  Sign up information is provided on this After Visit Summary.  MyChart is used to connect with patients for Virtual Visits (Telemedicine).  Patients are able to view lab/test results, encounter notes, upcoming appointments, etc.  Non-urgent messages can be sent to your provider as well.   ?To learn more about what you can do with MyChart, go to [email or web address].   ? ?Your next appointment:   ?1 year(s) ? ?The format for your next appointment:   ?In Person ? ?Provider:   ?Samuel McDowell, MD  ? ? ?Other Instructions ?Thank you for choosing [facility]! ? ? ? ?Important Information About Sugar ? ? ? ? ? ? ?

## 2021-09-28 NOTE — Progress Notes (Signed)
Cardiology Office Note    Date:  09/28/2021   ID:  Sara, Terry [birth year], MRN [id number]  PCP:  Renee Rival, FNP  Cardiologist: Rozann Lesches, MD    Chief Complaint  Patient presents with   Follow-up    3 month visit    History of Present Illness:    Sara Terry is a 76 y.o. female with past medical history of permanent atrial fibrillation, HTN, GERD and Raynaud's phenomena who presents to the office today for 33-monthfollow-up.  She was last examined by Dr. MDomenic Politein 06/2021 and denied any recent chest pain or palpitations at that time. EKG did show atrial fibrillation with heart rate of 120 bpm and her heart rate remained elevated despite sitting for several minutes. Cardizem CD was therefore increased to 360 mg daily and she was continued on Eliquis and Vasotec. She was only taking Lasix as needed but it was recommended if she needed this routinely, would discontinue HCTZ. She did have repeat echocardiogram in the interim which showed her EF remained preserved at 60 to 65% and she had a calcified aortic valve but no evidence of stenosis.  In talking with the patient today, she reports overall feeling well since her last visit. Says she was overall asymptomatic when her heart rate was elevated. She does have a Fitbit and checks her heart rate at home and says it is occasionally elevated in the 120's but is typically well controlled. No recent chest pain or palpitations. No recent orthopnea, PND or pitting edema. She does experience isolated edema along her ankle joints bilaterally. She remains on Eliquis 5 mg twice daily for anticoagulation with no recent bleeding issues.  Past Medical History:  Diagnosis Date   Arthritis    right hip and right knee (had right hip replacement)   Atrial fibrillation (HCC)    Essential hypertension    GERD (gastroesophageal reflux disease)    Glucose intolerance (impaired glucose tolerance)    Insomnia    Raynaud phenomenon     Skin cancer    Forehead    Past Surgical History:  Procedure Laterality Date   ABDOMINOPLASTY     BREAST REDUCTION SURGERY  2003   COLONOSCOPY     COLONOSCOPY N/A 06/13/2013   Procedure: COLONOSCOPY;  Surgeon: NRogene Houston MD;  Location: [facility];  Service: Endoscopy;  Laterality: N/A;  930   FOOT SURGERY Left 02/09/2016   TONSILLECTOMY     TOTAL HIP ARTHROPLASTY Right 04/27/2016   Procedure: RIGHT TOTAL HIP ARTHROPLASTY ANTERIOR APPROACH;  Surgeon: FGaynelle Arabian MD;  Location: [facility];  Service: Orthopedics;  Laterality: Right;   TUBAL LIGATION      Current Medications: Outpatient Medications Prior to Visit  Medication Sig Dispense Refill   apixaban (ELIQUIS) 5 MG TABS tablet Take 1 tablet (5 mg total) by mouth 2 (two) times daily. 180 tablet 1   atorvastatin (LIPITOR) 40 MG tablet TAKE 1 TABLET DAILY AT 6PM 90 tablet 3   calcium carbonate (OS-CAL - DOSED IN MG OF ELEMENTAL CALCIUM) 1250 (500 Ca) MG tablet Take 1 tablet by mouth 2 (two) times daily with a meal.      diltiazem (CARDIZEM CD) 360 MG 24 hr capsule Take 1 capsule (360 mg total) by mouth daily. 90 capsule 1   docusate sodium (COLACE) 100 MG capsule Take 100 mg by mouth daily.     enalapril (VASOTEC) 20 MG tablet Take 1 tablet (20 mg total) by mouth daily.  90 tablet 3   furosemide (LASIX) 20 MG tablet Take 1 tablet (20 mg total) by mouth as needed for edema. 30 tablet 11   hydrochlorothiazide (HYDRODIURIL) 25 MG tablet Take 1 tablet (25 mg total) by mouth every morning. 90 tablet 3   zolpidem (AMBIEN) 10 MG tablet Take 0.5 tablets (5 mg total) by mouth at bedtime as needed for sleep. 30 tablet 0   No facility-administered medications prior to visit.     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[employer]  Tobacco Use   Smoking status: Former    Types: Cigarettes    Start date: 03/07/1964    Quit date: 03/07/1978    Years since quitting: 43.5   Smokeless tobacco: Never  Vaping Use   Vaping Use: Never used  Substance and Sexual Activity   Alcohol use: Yes    Comment: 2.5 glasses of wine daily   Drug use: No   Sexual activity: Not on file  Other Topics Concern   Not on file  Social History Narrative   1 child at [employer] and another in [place], [place]   Social Determinants of Health   Financial Resource Strain: [place]  (06/16/2020)   Overall Financial Resource Strain (CARDIA)    Difficulty of Paying Living Expenses: Not hard at all  Food Insecurity: No Food Insecurity (06/16/2020)   Hunger Vital Sign    Worried About Running Out of Food in the Last Year: Never true    [place] in the Last Year: Never true  Transportation Needs: No Transportation Needs (06/16/2020)   PRAPARE - Hydrologist (Medical): No    Lack of Transportation (Non-Medical): No  Physical Activity: Sufficiently Active (06/16/2020)   Exercise Vital Sign    Days of Exercise per Week: 3 days    Minutes of Exercise per Session: 60 min  Stress: No Stress Concern Present (06/16/2020)   [place]    Feeling of Stress : Not at all  Social Connections: Moderately Integrated (06/16/2020)   Social Connection and Isolation Panel [NHANES]    Frequency of Communication with Friends and Family: Three times a week    Frequency of Social Gatherings with Friends and Family: Twice a week    Attends Religious Services: More than 4 times per year    Active Member of [employer] or Organizations: No    Attends Music therapist: Never    Marital Status: Married     Family History:  The patient's family  history includes Diabetes in her father; Heart attack in her father; Hypertension in her brother, father, and mother.   Review of Systems:    Please see the history of present illness.     All other systems reviewed and are otherwise negative except as noted above.   Physical Exam:    VS:  BP 138/74   Pulse 91   Ht 5' 5.5" (1.664 m)   Wt 186 lb 9.6 oz (84.6 kg)   SpO2 97%   BMI 30.58 kg/m  General: Well developed, well nourished,female appearing in no acute distress. Head: Normocephalic, atraumatic. Neck: No carotid bruits. JVD not elevated.  Lungs: Respirations regular and unlabored, without wheezes or rales.  Heart: Irregularly irregular. No S3 or S4.  No murmur, no rubs, or gallops appreciated. Abdomen: Appears non-distended. No obvious abdominal masses. Msk:  Strength and tone appear normal for age. No obvious joint deformities or effusions. Extremities: No clubbing or cyanosis. No pitting edema.  Distal pedal pulses are 2+ bilaterally. Neuro: Alert and oriented X 3. Moves all extremities spontaneously. No focal deficits noted. Psych:  Responds to questions appropriately with a normal affect. Skin: No rashes or lesions noted  Wt Readings from Last 3 Encounters:  09/28/21 186 lb 9.6 oz (84.6 kg)  08/10/21 185 lb (83.9 kg)  06/25/21 185 lb 3.2 oz (84 kg)     Studies/Labs Reviewed:   EKG:  EKG is not ordered today.   Recent Labs: 03/09/2021: Hemoglobin 13.0; Platelets 310 08/03/2021: ALT 12; BUN 14; Creatinine, Ser 0.67; Potassium 4.7; Sodium 137   Lipid Panel    Component Value Date/Time   CHOL 154 08/03/2021 0822   TRIG 54 08/03/2021 0822   HDL 72 08/03/2021 0822   CHOLHDL 2.1 08/03/2021 0822   CHOLHDL 2.2 04/12/2018 0943   VLDL 7 04/12/2018 0943   LDLCALC 71 08/03/2021 0822    Additional studies/ records that were reviewed today include:   Echocardiogram: 07/09/2021 IMPRESSIONS     1. Left ventricular ejection fraction, by estimation, is 60 to 65%. The   left ventricle has normal function. The left ventricle has no regional  wall motion abnormalities. There is mild concentric left ventricular  hypertrophy. Left ventricular diastolic  function could not be evaluated.   2. Right ventricular systolic function is normal. The right ventricular  size is mildly enlarged. There is normal pulmonary artery systolic  pressure. The estimated right ventricular systolic pressure is 55.7 mmHg.   3. Left atrial size was mildly dilated.   4. The mitral valve is normal in structure. Trivial mitral valve  regurgitation. No evidence of mitral stenosis.   5. Tricuspid valve regurgitation is mild to moderate.   6. The aortic valve is calcified. There is severe calcifcation of the  aortic valve. There is moderate thickening of the aortic valve. Aortic  valve regurgitation is not visualized. Aortic valve  sclerosis/calcification is present, without any evidence of  aortic stenosis. Aortic valve area, by VTI measures 1.70 cm. Aortic valve  mean gradient measures 7.0 mmHg. Aortic valve Vmax measures 1.83 m/s.   7. The inferior vena cava is normal in size with greater than 50%  respiratory variability, suggesting right atrial pressure of 3 mmHg.   Assessment:    1. Permanent atrial fibrillation ([place])   2. Essential hypertension   3. Hyperlipidemia, unspecified hyperlipidemia type   4. Bilateral leg edema      Plan:   In order of problems listed above:  1. Permanent Atrial Fibrillation - Cardizem CD was titrated at the time of her last office visit and rates are in the 90's today. Continue Cardizem 360 mg daily for rate control. She does consume 2 to 3 glasses of wine daily and we reviewed this could be contributing to her intermittent elevated rates.  - She remains on Eliquis 5 mg twice daily for anticoagulation which is the appropriate dose at this time given her age, weight and renal function. No reports of active bleeding.   2. HTN - BP is at 138/74  during today's visit. Continue Cardizem CD '360mg'$  daily, Enalapril '20mg'$  daily and HCTZ '25mg'$  daily. Creatinine was stable at 0.67 in 07/2021.  3. HLD - Followed by her PCP. FLP in 07/2021 showed total cholesterol 154, triglycerides 54, HDL 72 and LDL 71.  She remains on Atorvastatin 40 mg daily.  4. Lower Extremity Edema - She experiences occasional swelling along her ankle joints bilaterally but no pitting edema. This did not progress with dose adjustment of Cardizem. She does have a prescription for Lasix 20 mg to take as needed but uses this less than once every few weeks. We did review she could use compression stockings if needed and I encouraged her to make [place] aware if she has to take Lasix more routinely as she is also on HCTZ.   Medication Adjustments/Labs and Tests Ordered: Current medicines are reviewed at length with the patient today.  Concerns regarding medicines are outlined above.  Medication changes, Labs and Tests ordered today are listed in the Patient Instructions below. Patient Instructions  Medication Instructions:  Your physician recommends that you continue on your current medications as directed. Please refer to the Current Medication list given to you today.  *If you need a refill on your cardiac medications before your next appointment, please call your pharmacy*   Lab Work: NONE   If you have labs (blood work) drawn today and your tests are completely normal, you will receive your results only by: [place] (if you have MyChart) OR A paper copy in the mail If you have any lab test that is abnormal or we need to change your treatment, we will call you to review the results.   Testing/Procedures: NONE    Follow-Up: At [facility], you and your health needs are our priority.  As part of our continuing mission to provide you with exceptional heart care, we have created designated Provider Care Teams.  These Care Teams include your primary Cardiologist  (physician) and Advanced Practice Providers (APPs -  Physician Assistants and Nurse Practitioners) who all work together to provide you with the care you need, when you need it.  We recommend signing up for the patient portal called "MyChart".  Sign up information is provided on this After Visit Summary.  MyChart is used to connect with patients for Virtual Visits (Telemedicine).  Patients are able to view lab/test results, encounter notes, upcoming appointments, etc.  Non-urgent messages can be sent to your provider as well.   To learn more about what you can do with MyChart, go to [email or web address].    Your next appointment:   1 year(s)  The format for your next appointment:   In Person  Provider:   Rozann Lesches, MD    Other Instructions Thank you for choosing [facility]!    Important Information About Sugar         Signed, Erma Heritage, PA-C  09/28/2021 5:11 PM    [facility] [address] [place], [place] [address] Phone: [phone] Fax: [phone]

## 2021-09-29 ENCOUNTER — Ambulatory Visit: Payer: Medicare HMO | Admitting: Cardiology

## 2021-10-04 DIAGNOSIS — D2262 Melanocytic nevi of left upper limb, including shoulder: Secondary | ICD-10-CM | POA: Diagnosis not present

## 2021-10-04 DIAGNOSIS — Z85828 Personal history of other malignant neoplasm of skin: Secondary | ICD-10-CM | POA: Diagnosis not present

## 2021-10-04 DIAGNOSIS — Z08 Encounter for follow-up examination after completed treatment for malignant neoplasm: Secondary | ICD-10-CM | POA: Diagnosis not present

## 2021-11-01 ENCOUNTER — Ambulatory Visit (INDEPENDENT_AMBULATORY_CARE_PROVIDER_SITE_OTHER): Payer: Medicare HMO

## 2021-11-01 DIAGNOSIS — Z0000 Encounter for general adult medical examination without abnormal findings: Secondary | ICD-10-CM | POA: Diagnosis not present

## 2021-11-01 NOTE — Patient Instructions (Signed)
  Ms. Manganelli , Thank you for taking time to come for your Medicare Wellness Visit. I appreciate your ongoing commitment to your health goals. Please review the following plan we discussed and let me know if I can assist you in the future.   These are the goals we discussed:  Goals      Patient Stated     None.     Weight (lb) < 160 lb (72.6 kg)        This is a list of the screening recommended for you and due dates:  Health Maintenance  Topic Date Due   Flu Shot  10/05/2021   Mammogram  11/27/2021   Colon Cancer Screening  06/14/2023   Tetanus Vaccine  03/22/2031   Pneumonia Vaccine  Completed   DEXA scan (bone density measurement)  Completed   Hepatitis C Screening: USPSTF Recommendation to screen - Ages 34-79 yo.  Completed   Zoster (Shingles) Vaccine  Completed   HPV Vaccine  Aged Out   COVID-19 Vaccine  Discontinued

## 2021-11-01 NOTE — Progress Notes (Signed)
Subjective:   Sara Terry is a 76 y.o. female who presents for Medicare Annual (Subsequent) preventive examination.  Review of Systems    I connected with  Sara Terry on 11/01/21 by a audio enabled telemedicine application and verified that I am speaking with the correct person using two identifiers.  Patient Location: Home  Provider Location: Office/Clinic  I discussed the limitations of evaluation and management by telemedicine. The patient expressed understanding and agreed to proceed.        Objective:    There were no vitals filed for this visit. There is no height or weight on file to calculate BMI.     06/16/2020    1:11 PM 04/27/2016   12:15 PM 04/21/2016   10:11 AM 11/08/2015    8:04 AM 06/13/2013    8:37 AM  Advanced Directives  Does Patient Have a Medical Advance Directive? No No No No Patient does not have advance directive  Would patient like information on creating a medical advance directive? No - Patient declined No - Patient declined  No - patient declined information   Pre-existing out of facility DNR order (yellow form or pink MOST form)     No    Current Medications (verified) Outpatient Encounter Medications as of 11/01/2021  Medication Sig   apixaban (ELIQUIS) 5 MG TABS tablet Take 1 tablet (5 mg total) by mouth 2 (two) times daily.   atorvastatin (LIPITOR) 40 MG tablet TAKE 1 TABLET DAILY AT 6PM   calcium carbonate (OS-CAL - DOSED IN MG OF ELEMENTAL CALCIUM) 1250 (500 Ca) MG tablet Take 1 tablet by mouth 2 (two) times daily with a meal.    diltiazem (CARDIZEM CD) 360 MG 24 hr capsule Take 1 capsule (360 mg total) by mouth daily.   docusate sodium (COLACE) 100 MG capsule Take 100 mg by mouth daily.   enalapril (VASOTEC) 20 MG tablet Take 1 tablet (20 mg total) by mouth daily.   furosemide (LASIX) 20 MG tablet Take 1 tablet (20 mg total) by mouth as needed for edema.   hydrochlorothiazide (HYDRODIURIL) 25 MG tablet Take 1 tablet (25 mg total) by mouth  every morning.   zolpidem (AMBIEN) 10 MG tablet Take 0.5 tablets (5 mg total) by mouth at bedtime as needed for sleep.   No facility-administered encounter medications on file as of 11/01/2021.    Allergies (verified) Maxzide [triamterene-hctz] and Penicillins   History: Past Medical History:  Diagnosis Date   Arthritis    right hip and right knee (had right hip replacement)   Atrial fibrillation (HCC)    Essential hypertension    GERD (gastroesophageal reflux disease)    Glucose intolerance (impaired glucose tolerance)    Insomnia    Raynaud phenomenon    Skin cancer    Forehead   Past Surgical History:  Procedure Laterality Date   ABDOMINOPLASTY     BREAST REDUCTION SURGERY  2003   COLONOSCOPY     COLONOSCOPY N/A 06/13/2013   Procedure: COLONOSCOPY;  Surgeon: Rogene Houston, MD;  Location: [facility];  Service: Endoscopy;  Laterality: N/A;  930   FOOT SURGERY Left 02/09/2016   TONSILLECTOMY     TOTAL HIP ARTHROPLASTY Right 04/27/2016   Procedure: RIGHT TOTAL HIP ARTHROPLASTY ANTERIOR APPROACH;  Surgeon: Gaynelle Arabian, MD;  Location: [facility];  Service: Orthopedics;  Laterality: Right;   TUBAL LIGATION     Family History  Problem Relation Age of Onset   Hypertension Mother  Hypertension Father    Diabetes Father    Heart attack Father    Hypertension Brother    Social History   Socioeconomic History   Marital status: Married    Spouse name: Not on file   Number of children: 2   Years of education: Not on file   Highest education level: Not on file  Occupational History   Occupation: [place] now    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[employer]  Tobacco Use   Smoking status: Former    Types: Cigarettes    Start date: 03/07/1964    Quit date: 03/07/1978    Years since quitting: 43.6   Smokeless tobacco: Never  Vaping Use   Vaping Use: Never used  Substance and Sexual Activity   Alcohol use: Yes    Comment: 2.5 glasses of wine daily    Drug use: No   Sexual activity: Not on file  Other Topics Concern   Not on file  Social History Narrative   1 child at [employer] and another in [place], [place]   Social Determinants of Health   Financial Resource Strain: [place]  (06/16/2020)   Overall Financial Resource Strain (CARDIA)    Difficulty of Paying Living Expenses: Not hard at all  Food Insecurity: No Food Insecurity (06/16/2020)   Hunger Vital Sign    Worried About Running Out of Food in the Last Year: Never true    [place] in the Last Year: Never true  Transportation Needs: No Transportation Needs (06/16/2020)   PRAPARE - Hydrologist (Medical): No    Lack of Transportation (Non-Medical): No  Physical Activity: Sufficiently Active (06/16/2020)   Exercise Vital Sign    Days of Exercise per Week: 3 days    Minutes of Exercise per Session: 60 min  Stress: No Stress Concern Present (06/16/2020)   [place]    Feeling of Stress : Not at all  Social Connections: Moderately Integrated (06/16/2020)   Social Connection and Isolation Panel [NHANES]    Frequency of Communication with Friends and Family: Three times a week    Frequency of Social Gatherings with Friends and Family: Twice a week    Attends Religious Services: More than 4 times per year    Active Member of [employer] or Organizations: No    Attends Archivist Meetings: Never    Marital Status: Married    Tobacco Counseling Counseling given: Not Answered   Clinical Intake:  Sara Terry , Thank you for taking time to come for your Medicare Wellness Visit. I appreciate your ongoing commitment to your health goals. Please review the following plan we discussed and let me know if I can assist you in the future.   These are the goals we discussed:  Goals      Patient Stated     None.     Weight (lb) < 160 lb (72.6 kg)        This is a list of the  screening recommended for you and due dates:  Health Maintenance  Topic Date Due   Flu Shot  10/05/2021   Mammogram  11/27/2021   Colon Cancer Screening  06/14/2023   Tetanus Vaccine  03/22/2031   Pneumonia Vaccine  Completed   DEXA scan (bone density measurement)  Completed   Hepatitis C Screening: USPSTF Recommendation to screen - Ages 74-79 yo.  Completed  Zoster (Shingles) Vaccine  Completed   HPV Vaccine  Aged Out   COVID-19 Vaccine  Discontinued                How often do you need to have someone help you when you read instructions, pamphlets, or other written materials from your doctor or pharmacy?: (P) 1 - Never  Diabetic?no         Activities of Daily Living    10/28/2021    8:55 AM  In your present state of health, do you have any difficulty performing the following activities:  Hearing? 0  Vision? 0  Difficulty concentrating or making decisions? 0  Walking or climbing stairs? 0  Dressing or bathing? 0  Doing errands, shopping? 0  Preparing Food and eating ? N  Using the Toilet? N  In the past six months, have you accidently leaked urine? N  Do you have problems with loss of bowel control? N  Managing your Medications? N  Managing your Finances? N  Housekeeping or managing your Housekeeping? N    Patient Care Team: Renee Rival, FNP as PCP - General (Nurse Practitioner) Satira Sark, MD as PCP - Cardiology (Cardiology)  Indicate any recent Medical Services you may have received from other than [facility] providers in the past year (date may be approximate).     Assessment:   This is a routine wellness examination for [name].  Hearing/Vision screen No results found.  Dietary issues and exercise activities discussed:     Goals Addressed   None   Depression Screen    08/10/2021   10:28 AM 06/22/2021    8:42 AM 03/11/2021    9:45 AM 09/08/2020   10:27 AM 06/16/2020    1:14 PM 05/05/2020   10:19 AM 05/08/2019   10:27 AM  PHQ 2/9 Scores   PHQ - 2 Score 0 0 0 0 0 0 0    Fall Risk    10/28/2021    8:55 AM 08/10/2021   10:28 AM 06/22/2021    8:42 AM 03/11/2021    9:44 AM 09/08/2020   10:27 AM  Fall Risk   Falls in the past year? 0 0 0 0 1  Number falls in past yr:  0 0 0 0  Injury with Fall?  0 0 0 0  Risk for fall due to :  No Fall Risks No Fall Risks No Fall Risks Impaired balance/gait  Follow up  Falls evaluation completed Falls evaluation completed Falls evaluation completed Falls evaluation completed    FALL RISK PREVENTION PERTAINING TO THE HOME:  Any stairs in or around the home? Yes  If so, are there any without handrails? Yes  Home free of loose throw rugs in walkways, pet beds, electrical cords, etc? Yes  Adequate lighting in your home to reduce risk of falls? Yes   ASSISTIVE DEVICES UTILIZED TO PREVENT FALLS:  Life alert? No  Use of a cane, walker or w/c? No  Grab bars in the bathroom? No  Shower chair or bench in shower? No  Elevated toilet seat or a handicapped toilet? No   Immunizations Immunization History  Administered Date(s) Administered   Fluad Quad(high Dose 65+) 11/22/2020   Influenza,inj,Quad PF,6+ Mos 01/05/2015   Influenza-Unspecified 12/17/2013, 01/04/2016, 04/30/2017, 01/21/2018, 01/12/2019, 12/17/2019   Moderna Sars-Covid-2 Vaccination 04/20/2019, 05/19/2019, 01/11/2020   Pneumococcal Conjugate-13 06/12/2019   Pneumococcal Polysaccharide-23 12/26/2013   Tdap 03/21/2021   Zoster Recombinat (Shingrix) 07/29/2019, 11/08/2019   Zoster, Live 04/24/2009  TDAP status: Up to date  Flu Vaccine status: Due, Education has been provided regarding the importance of this vaccine. Advised may receive this vaccine at local pharmacy or Health Dept. Aware to provide a copy of the vaccination record if obtained from local pharmacy or Health Dept. Verbalized acceptance and understanding.  Pneumococcal vaccine status: Up to date  Covid-19 vaccine status: Completed vaccines  Qualifies for Shingles  Vaccine? Yes   Zostavax completed Yes   Shingrix Completed?: Yes  Screening Tests Health Maintenance  Topic Date Due   INFLUENZA VACCINE  10/05/2021   MAMMOGRAM  11/27/2021   COLONOSCOPY (Pts 45-67yr Insurance coverage will need to be confirmed)  06/14/2023   TETANUS/TDAP  03/22/2031   Pneumonia Vaccine 75 Years old  Completed   DEXA SCAN  Completed   Hepatitis C Screening  Completed   Zoster Vaccines- Shingrix  Completed   HPV VACCINES  Aged Out   COVID-19 Vaccine  Discontinued    Health Maintenance  Health Maintenance Due  Topic Date Due   INFLUENZA VACCINE  10/05/2021    Colorectal cancer screening: Type of screening: Colonoscopy. Completed 06/13/2013. Repeat every 10 years  Mammogram status: Completed 11/27/20. Repeat every year  Bone Density status: Completed 05/15/2019. Results reflect: Bone density results: NORMAL. Repeat every 2 years.  Lung Cancer Screening: (Low Dose CT Chest recommended if Age 310-80years, 30 pack-year currently smoking OR have quit w/in 15years.) does not qualify.   Lung Cancer Screening Referral: no  Additional Screening:  Hepatitis C Screening: does qualify; Completed 03/09/21  Vision Screening: Recommended annual ophthalmology exams for early detection of glaucoma and other disorders of the eye. Is the patient up to date with their annual eye exam?  Yes  Who is the provider or what is the name of the office in which the patient attends annual eye exams? Dr. CJorja LoaIf pt is not established with a provider, would they like to be referred to a provider to establish care? No .   Dental Screening: Recommended annual dental exams for proper oral hygiene  Community Resource Referral / Chronic Care Management: CRR required this visit?  No   CCM required this visit?  No      Plan:     I have personally reviewed and noted the following in the patient's chart:   Medical and social history Use of alcohol, tobacco or illicit drugs   Current medications and supplements including opioid prescriptions. Patient is not currently taking opioid prescriptions. Functional ability and status Nutritional status Physical activity Advanced directives List of other physicians Hospitalizations, surgeries, and ER visits in previous 12 months Vitals Screenings to include cognitive, depression, and falls Referrals and appointments  In addition, I have reviewed and discussed with patient certain preventive protocols, quality metrics, and best practice recommendations. A written personalized care plan for preventive services as well as general preventive health recommendations were provided to patient.     KQuentin Angst [place]  11/01/2021

## 2021-11-03 ENCOUNTER — Encounter: Payer: Self-pay | Admitting: Nurse Practitioner

## 2021-11-15 DIAGNOSIS — X32XXXD Exposure to sunlight, subsequent encounter: Secondary | ICD-10-CM | POA: Diagnosis not present

## 2021-11-15 DIAGNOSIS — C44519 Basal cell carcinoma of skin of other part of trunk: Secondary | ICD-10-CM | POA: Diagnosis not present

## 2021-11-15 DIAGNOSIS — L570 Actinic keratosis: Secondary | ICD-10-CM | POA: Diagnosis not present

## 2021-11-18 ENCOUNTER — Encounter: Payer: Self-pay | Admitting: Family Medicine

## 2021-11-24 ENCOUNTER — Ambulatory Visit (INDEPENDENT_AMBULATORY_CARE_PROVIDER_SITE_OTHER): Payer: Medicare HMO

## 2021-11-24 DIAGNOSIS — Z23 Encounter for immunization: Secondary | ICD-10-CM

## 2021-12-10 DIAGNOSIS — Z1231 Encounter for screening mammogram for malignant neoplasm of breast: Secondary | ICD-10-CM | POA: Diagnosis not present

## 2021-12-10 LAB — HM MAMMOGRAPHY

## 2021-12-14 DIAGNOSIS — X32XXXD Exposure to sunlight, subsequent encounter: Secondary | ICD-10-CM | POA: Diagnosis not present

## 2021-12-14 DIAGNOSIS — Z08 Encounter for follow-up examination after completed treatment for malignant neoplasm: Secondary | ICD-10-CM | POA: Diagnosis not present

## 2021-12-14 DIAGNOSIS — L570 Actinic keratosis: Secondary | ICD-10-CM | POA: Diagnosis not present

## 2021-12-14 DIAGNOSIS — Z85828 Personal history of other malignant neoplasm of skin: Secondary | ICD-10-CM | POA: Diagnosis not present

## 2021-12-15 ENCOUNTER — Encounter: Payer: Self-pay | Admitting: Family Medicine

## 2021-12-15 NOTE — Progress Notes (Signed)
documentation

## 2021-12-18 ENCOUNTER — Other Ambulatory Visit: Payer: Self-pay | Admitting: Cardiology

## 2022-02-04 DIAGNOSIS — I10 Essential (primary) hypertension: Secondary | ICD-10-CM | POA: Diagnosis not present

## 2022-02-04 DIAGNOSIS — Z139 Encounter for screening, unspecified: Secondary | ICD-10-CM | POA: Diagnosis not present

## 2022-02-04 DIAGNOSIS — E785 Hyperlipidemia, unspecified: Secondary | ICD-10-CM | POA: Diagnosis not present

## 2022-02-04 DIAGNOSIS — R7303 Prediabetes: Secondary | ICD-10-CM | POA: Diagnosis not present

## 2022-02-04 DIAGNOSIS — R7301 Impaired fasting glucose: Secondary | ICD-10-CM | POA: Diagnosis not present

## 2022-02-04 DIAGNOSIS — E782 Mixed hyperlipidemia: Secondary | ICD-10-CM | POA: Diagnosis not present

## 2022-02-05 LAB — HEMOGLOBIN A1C
Est. average glucose Bld gHb Est-mCnc: 120 mg/dL
Hgb A1c MFr Bld: 5.8 % — ABNORMAL HIGH (ref 4.8–5.6)

## 2022-02-05 LAB — LIPID PANEL
Chol/HDL Ratio: 2 ratio (ref 0.0–4.4)
Cholesterol, Total: 151 mg/dL (ref 100–199)
HDL: 75 mg/dL (ref >39)
LDL Chol Calc (NIH): 66 mg/dL (ref 0–99)
Triglycerides: 46 mg/dL (ref 0–149)
VLDL Cholesterol Cal: 10 mg/dL (ref 5–40)

## 2022-02-05 LAB — VITAMIN D 25 HYDROXY (VIT D DEFICIENCY, FRACTURES): Vit D, 25-Hydroxy: 47.8 ng/mL (ref 30.0–100.0)

## 2022-02-05 LAB — TSH: TSH: 2 u[IU]/mL (ref 0.450–4.500)

## 2022-02-05 LAB — CMP14+EGFR
ALT: 11 IU/L (ref 0–32)
AST: 15 IU/L (ref 0–40)
Albumin/Globulin Ratio: 2.1 (ref 1.2–2.2)
Albumin: 4 g/dL (ref 3.8–4.8)
Alkaline Phosphatase: 66 IU/L (ref 44–121)
BUN/Creatinine Ratio: 20 (ref 12–28)
BUN: 11 mg/dL (ref 8–27)
Bilirubin Total: 0.4 mg/dL (ref 0.0–1.2)
CO2: 25 mmol/L (ref 20–29)
Calcium: 9.1 mg/dL (ref 8.7–10.3)
Chloride: 98 mmol/L (ref 96–106)
Creatinine, Ser: 0.55 mg/dL — ABNORMAL LOW (ref 0.57–1.00)
Globulin, Total: 1.9 g/dL (ref 1.5–4.5)
Glucose: 113 mg/dL — ABNORMAL HIGH (ref 70–99)
Potassium: 4.5 mmol/L (ref 3.5–5.2)
Sodium: 138 mmol/L (ref 134–144)
Total Protein: 5.9 g/dL — ABNORMAL LOW (ref 6.0–8.5)
eGFR: 96 mL/min/{1.73_m2} (ref >59)

## 2022-02-05 LAB — CBC WITH DIFFERENTIAL/PLATELET
Basophils Absolute: 0 10*3/uL (ref 0.0–0.2)
Basos: 1 %
EOS (ABSOLUTE): 0.1 10*3/uL (ref 0.0–0.4)
Eos: 2 %
Hematocrit: 36.4 % (ref 34.0–46.6)
Hemoglobin: 12.7 g/dL (ref 11.1–15.9)
Immature Grans (Abs): 0 10*3/uL (ref 0.0–0.1)
Immature Granulocytes: 0 %
Lymphocytes Absolute: 1.7 10*3/uL (ref 0.7–3.1)
Lymphs: 39 %
MCH: 33.6 pg — ABNORMAL HIGH (ref 26.6–33.0)
MCHC: 34.9 g/dL (ref 31.5–35.7)
MCV: 96 fL (ref 79–97)
Monocytes Absolute: 0.5 10*3/uL (ref 0.1–0.9)
Monocytes: 10 %
Neutrophils Absolute: 2.1 10*3/uL (ref 1.4–7.0)
Neutrophils: 48 %
Platelets: 284 10*3/uL (ref 150–450)
RBC: 3.78 x10E6/uL (ref 3.77–5.28)
RDW: 11.4 % — ABNORMAL LOW (ref 11.7–15.4)
WBC: 4.3 10*3/uL (ref 3.4–10.8)

## 2022-02-09 ENCOUNTER — Encounter: Payer: Medicare HMO | Admitting: Nurse Practitioner

## 2022-02-09 ENCOUNTER — Encounter: Payer: Self-pay | Admitting: Family Medicine

## 2022-02-09 ENCOUNTER — Ambulatory Visit (INDEPENDENT_AMBULATORY_CARE_PROVIDER_SITE_OTHER): Payer: Medicare HMO | Admitting: Family Medicine

## 2022-02-09 VITALS — BP 150/82 | HR 100 | Resp 16 | Ht 65.5 in | Wt 185.0 lb

## 2022-02-09 DIAGNOSIS — M1711 Unilateral primary osteoarthritis, right knee: Secondary | ICD-10-CM

## 2022-02-09 DIAGNOSIS — Z0001 Encounter for general adult medical examination with abnormal findings: Secondary | ICD-10-CM | POA: Diagnosis not present

## 2022-02-09 DIAGNOSIS — I10 Essential (primary) hypertension: Secondary | ICD-10-CM

## 2022-02-09 NOTE — Assessment & Plan Note (Signed)
Physical exam as documented Counseling is done on healthy lifestyle involving commitment to 150 minutes of exercise per week,  Discussed heart-healthy diet

## 2022-02-09 NOTE — Assessment & Plan Note (Signed)
Uncontrolled Encouraged low-sodium diet She reports taking hydrochlorothiazide 25 mg daily We will follow-up on blood pressure in 2 weeks BP Readings from Last 3 Encounters:  02/09/22 (!) 150/82  09/28/21 138/74  08/10/21 123/85

## 2022-02-09 NOTE — Assessment & Plan Note (Signed)
Encouraged and recommended conservative management Encouraged to continue on physical activities to at least 3 days a week Encouraged heat therapy to relieve symptoms of joint stiffness Encouraged to take Tylenol as needed for pain

## 2022-02-09 NOTE — Progress Notes (Signed)
Complete physical exam  Patient: Sara Terry   DOB: 1946-02-14   76 y.o. Female  MRN: [id number]  Subjective:    Chief Complaint  Patient presents with   Annual Exam   Joint Swelling    Has been having some swelling above her right knee x 1 month. No pain but sometimes feels stiff     Sara Terry is a 76 y.o. female who presents today for a complete physical exam. She reports consuming a general diet.  She reports going to the gym 3 days a week and doing water aerobics, and line dancing in [place]  She generally feels well. She reports sleeping well. She does have additional problems to discuss today.   Osteoarthritis of her right knee: She complains of morning stiffness that lasts less than 30 minutes and swelling.  She denies signs of inflammation with no recent injury or trauma to the affected knee.  Most recent fall risk assessment:    02/09/2022    8:59 AM  Fall Risk   Falls in the past year? 0  Number falls in past yr: 0  Injury with Fall? 0     Most recent depression screenings:    02/09/2022    8:59 AM 11/01/2021    1:41 PM  PHQ 2/9 Scores  PHQ - 2 Score 0 0    Dental: No current dental problems and Last dental visit: sept 2023  Patient Active Problem List   Diagnosis Date Noted   Encounter for annual general medical examination with abnormal findings in adult 02/09/2022   Bilateral lower extremity edema 08/10/2021   Parotitis, acute 06/22/2021   Prediabetes 03/11/2021   History of revision of total replacement of right hip joint 05/05/2017   OA (osteoarthritis) of knee 04/27/2016   OA (osteoarthritis) of hip 04/27/2016   New onset a-fib ([place]) 11/08/2015   HLD (hyperlipidemia) 10/26/2012   Essential hypertension, benign 10/26/2012   Impaired fasting glucose 10/26/2012   Insomnia 10/26/2012   Past Medical History:  Diagnosis Date   Arthritis    right hip and right knee (had right hip replacement)   Atrial fibrillation (HCC)    Essential  hypertension    GERD (gastroesophageal reflux disease)    Glucose intolerance (impaired glucose tolerance)    Insomnia    Raynaud phenomenon    Skin cancer    Forehead   Past Surgical History:  Procedure Laterality Date   ABDOMINOPLASTY     BREAST REDUCTION SURGERY  2003   COLONOSCOPY     COLONOSCOPY N/A 06/13/2013   Procedure: COLONOSCOPY;  Surgeon: Rogene Houston, MD;  [facility];  Service: Endoscopy;  Laterality: N/A;  930   FOOT SURGERY Left 02/09/2016   TONSILLECTOMY     TOTAL HIP ARTHROPLASTY Right 04/27/2016   Procedure: RIGHT TOTAL HIP ARTHROPLASTY ANTERIOR APPROACH;  Surgeon: Gaynelle Arabian, MD;  Location: [facility];  Service: Orthopedics;  Laterality: Right;   TUBAL LIGATION     Social History   Tobacco Use   Smoking status: Former    Types: Cigarettes    Start date: 03/07/1964    Quit date: 03/07/1978    Years since quitting: 43.9   Smokeless tobacco: Never  Vaping Use   Vaping Use: Never used  Substance Use Topics   Alcohol use: Yes    Comment: 2.5 glasses of wine daily   Drug use: No   Social History   Socioeconomic History   Marital status: Married  Spouse name: Not on file   Number of children: 2   Years of education: Not on file   Highest education level: Not on file  Occupational History   Occupation: [place]- [employer] now    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[employer]  Tobacco Use   Smoking status: Former    Types: Cigarettes    Start date: 03/07/1964    Quit date: 03/07/1978    Years since quitting: 43.9   Smokeless tobacco: Never  Vaping Use   Vaping Use: Never used  Substance and Sexual Activity   Alcohol use: Yes    Comment: 2.5 glasses of wine daily   Drug use: No   Sexual activity: Not on file  Other Topics Concern   Not on file  Social History Narrative   1 child at [employer] and another in [place], [place]   Social Determinants of Health   Financial Resource Strain: [place]  (11/01/2021)   Overall  Financial Resource Strain (CARDIA)    Difficulty of Paying Living Expenses: Not hard at all  Food Insecurity: No Food Insecurity (11/01/2021)   Hunger Vital Sign    Worried About Running Out of Food in the Last Year: Never true    [place] in the Last Year: Never true  Transportation Needs: No Transportation Needs (11/01/2021)   PRAPARE - Hydrologist (Medical): No    Lack of Transportation (Non-Medical): No  Physical Activity: Insufficiently Active (11/01/2021)   Exercise Vital Sign    Days of Exercise per Week: 3 days    Minutes of Exercise per Session: 30 min  Stress: No Stress Concern Present (11/01/2021)   [place]    Feeling of Stress : Not at all  Social Connections: Moderately Integrated (11/01/2021)   Social Connection and Isolation Panel [NHANES]    Frequency of Communication with Friends and Family: More than three times a week    Frequency of Social Gatherings with Friends and Family: More than three times a week    Attends Religious Services: More than 4 times per year    Active Member of [employer] or Organizations: No    Attends Archivist Meetings: Never    Marital Status: Married  Human resources officer Violence: Not At Risk (11/01/2021)   Humiliation, Afraid, Rape, and Kick questionnaire    Fear of Current or Ex-Partner: No    Emotionally Abused: No    Physically Abused: No    Sexually Abused: No   Family Status  Relation Name Status   Mother  Deceased   Father  Deceased   Brother  (Not Specified)   Family History  Problem Relation Age of Onset   Hypertension Mother    Hypertension Father    Diabetes Father    Heart attack Father    Hypertension Brother    Allergies  Allergen Reactions   Maxzide [Triamterene-Hctz]    Penicillins Hives    Has patient had a PCN reaction causing immediate rash, facial/tongue/throat swelling, SOB or lightheadedness with  hypotension: Yes Has patient had a PCN reaction causing severe rash involving mucus membranes or skin necrosis: No Has patient had a PCN reaction that required hospitalization No Has patient had a PCN reaction occurring within the last 10 years: No If all of the above answers are "NO", then may proceed with Cephalosporin use.       Patient Care Team: Louanna Raw,  Peter Congo, [place] as PCP - General (Family Medicine) Satira Sark, MD as PCP - Cardiology (Cardiology)   Outpatient Medications Prior to Visit  Medication Sig   apixaban (ELIQUIS) 5 MG TABS tablet Take 1 tablet (5 mg total) by mouth 2 (two) times daily.   atorvastatin (LIPITOR) 40 MG tablet TAKE 1 TABLET DAILY AT 6PM   calcium carbonate (OS-CAL - DOSED IN MG OF ELEMENTAL CALCIUM) 1250 (500 Ca) MG tablet Take 1 tablet by mouth 2 (two) times daily with a meal.    diltiazem (CARDIZEM CD) 360 MG 24 hr capsule TAKE 1 CAPSULE DAILY (DOSE INCREASE 06/25/2021)   docusate sodium (COLACE) 100 MG capsule Take 100 mg by mouth daily.   enalapril (VASOTEC) 20 MG tablet Take 1 tablet (20 mg total) by mouth daily.   furosemide (LASIX) 20 MG tablet Take 1 tablet (20 mg total) by mouth as needed for edema.   hydrochlorothiazide (HYDRODIURIL) 25 MG tablet Take 1 tablet (25 mg total) by mouth every morning.   zolpidem (AMBIEN) 10 MG tablet Take 0.5 tablets (5 mg total) by mouth at bedtime as needed for sleep.   No facility-administered medications prior to visit.    Review of Systems  Constitutional:  Negative for chills and fever.  HENT:  Negative for congestion.   Respiratory:  Negative for shortness of breath.   Cardiovascular:  Negative for chest pain and palpitations.  Gastrointestinal:  Negative for diarrhea, nausea and vomiting.  Genitourinary:  Negative for frequency and urgency.  Musculoskeletal:  Positive for myalgias. Negative for back pain.  Neurological:  Negative for dizziness and headaches.       Objective:    BP (!) 150/82    Pulse 100   Resp 16   Ht 5' 5.5" (1.664 m)   Wt 185 lb (83.9 kg)   SpO2 98%   BMI 30.32 kg/m  BP Readings from Last 3 Encounters:  02/09/22 (!) 150/82  09/28/21 138/74  08/10/21 123/85   Wt Readings from Last 3 Encounters:  02/09/22 185 lb (83.9 kg)  09/28/21 186 lb 9.6 oz (84.6 kg)  08/10/21 185 lb (83.9 kg)      Physical Exam HENT:     Head: Normocephalic.     Right Ear: External ear normal.     Left Ear: External ear normal.     Nose: No congestion.     Mouth/Throat:     Mouth: Mucous membranes are moist.  Eyes:     Extraocular Movements: Extraocular movements intact.     Pupils: Pupils are equal, round, and reactive to light.  Cardiovascular:     Rate and Rhythm: Normal rate and regular rhythm.     Heart sounds: No murmur heard. Pulmonary:     Effort: Pulmonary effort is normal.     Breath sounds: Normal breath sounds.  Abdominal:     Palpations: Abdomen is soft.     Tenderness: There is no right CVA tenderness or left CVA tenderness.  Musculoskeletal:     Cervical back: No rigidity.     Right knee: No swelling, effusion or erythema. Normal range of motion.     Left knee: No swelling, effusion or erythema. Normal range of motion.     Right lower leg: No edema.     Left lower leg: No edema.  Skin:    General: Skin is warm and dry.  Neurological:     Mental Status: She is alert and oriented to person, place, and time.  Psychiatric:  Comments: Normal affect     No results found for any visits on 02/09/22. Last CBC Lab Results  Component Value Date   WBC 4.3 02/04/2022   HGB 12.7 02/04/2022   HCT 36.4 02/04/2022   MCV 96 02/04/2022   MCH 33.6 (H) 02/04/2022   RDW 11.4 (L) 02/04/2022   PLT 284 97/47/1855   Last metabolic panel Lab Results  Component Value Date   GLUCOSE 113 (H) 02/04/2022   NA 138 02/04/2022   K 4.5 02/04/2022   CL 98 02/04/2022   CO2 25 02/04/2022   BUN 11 02/04/2022   CREATININE 0.55 (L) 02/04/2022   EGFR 96 02/04/2022    CALCIUM 9.1 02/04/2022   PROT 5.9 (L) 02/04/2022   ALBUMIN 4.0 02/04/2022   LABGLOB 1.9 02/04/2022   AGRATIO 2.1 02/04/2022   BILITOT 0.4 02/04/2022   ALKPHOS 66 02/04/2022   AST 15 02/04/2022   ALT 11 02/04/2022   ANIONGAP 8 04/12/2018   Last lipids Lab Results  Component Value Date   CHOL 151 02/04/2022   HDL 75 02/04/2022   LDLCALC 66 02/04/2022   TRIG 46 02/04/2022   CHOLHDL 2.0 02/04/2022   Last hemoglobin A1c Lab Results  Component Value Date   HGBA1C 5.8 (H) 02/04/2022   Last thyroid functions Lab Results  Component Value Date   TSH 2.000 02/04/2022   Last vitamin D Lab Results  Component Value Date   VD25OH 47.8 02/04/2022   Last vitamin B12 and Folate No results found for: "VITAMINB12", "FOLATE"      Assessment & Plan:    Routine Health Maintenance and Physical Exam  Immunization History  Administered Date(s) Administered   Fluad Quad(high Dose 65+) 11/22/2020, 11/24/2021   Influenza,inj,Quad PF,6+ Mos 01/05/2015   Influenza-Unspecified 12/17/2013, 01/04/2016, 04/30/2017, 01/21/2018, 01/12/2019, 12/17/2019   Moderna Sars-Covid-2 Vaccination 04/20/2019, 05/19/2019, 01/11/2020   Pneumococcal Conjugate-13 06/12/2019   Pneumococcal Polysaccharide-23 12/26/2013   Tdap 03/21/2021   Zoster Recombinat (Shingrix) 07/29/2019, 11/08/2019   Zoster, Live 04/24/2009    Health Maintenance  Topic Date Due   Medicare Annual Wellness (AWV)  11/02/2022   MAMMOGRAM  12/11/2022   COLONOSCOPY (Pts 45-50yr Insurance coverage will need to be confirmed)  06/14/2023   DTaP/Tdap/Td (2 - Td or Tdap) 03/22/2031   Pneumonia Vaccine 75 Years old  Completed   INFLUENZA VACCINE  Completed   DEXA SCAN  Completed   Hepatitis C Screening  Completed   Zoster Vaccines- Shingrix  Completed   HPV VACCINES  Aged Out   COVID-19 Vaccine  Discontinued    Discussed health benefits of physical activity, and encouraged her to engage in regular exercise appropriate for her age  and condition.  Encounter for annual general medical examination with abnormal findings in adult Assessment & Plan: Physical exam as documented Counseling is done on healthy lifestyle involving commitment to 150 minutes of exercise per week,  Discussed heart-healthy diet       Primary osteoarthritis of right knee Assessment & Plan: Encouraged and recommended conservative management Encouraged to continue on physical activities to at least 3 days a week Encouraged heat therapy to relieve symptoms of joint stiffness Encouraged to take Tylenol as needed for pain   Essential hypertension, benign Assessment & Plan: Uncontrolled Encouraged low-sodium diet She reports taking hydrochlorothiazide 25 mg daily We will follow-up on blood pressure in 2 weeks BP Readings from Last 3 Encounters:  02/09/22 (!) 150/82  09/28/21 138/74  08/10/21 123/85        Return in  about 2 weeks (around 02/23/2022) for BP.     Alvira Monday, FNP

## 2022-02-09 NOTE — Patient Instructions (Addendum)
I appreciate the opportunity to provide care to you today!    Follow up:  2 weeks for BP  Osteoarthritis of the knees I recommend heat and cold therapy to the knees Cold therapy to decrease swelling, and heat therapy  to relieve symptoms of stiffness and joint pain I recommend that you continue exercising 3 times weekly for pain relief, functional improvement, and joint protection  Hypertension I recommend decreasing your daily intake of sodium to less than 1500 mg I also recommend taking Lasix 20 mg as needed for edema    Please continue to a heart-healthy diet and increase your physical activities. Try to exercise for 58mns at least three times a week.      It was a pleasure to see you and I look forward to continuing to work together on your health and well-being. Please do not hesitate to call the office if you need care or have questions about your care.   Have a wonderful day and week. With Gratitude, GAlvira MondayMSN, FNP-BC

## 2022-03-02 ENCOUNTER — Encounter: Payer: Self-pay | Admitting: Family Medicine

## 2022-03-02 ENCOUNTER — Ambulatory Visit (INDEPENDENT_AMBULATORY_CARE_PROVIDER_SITE_OTHER): Payer: Medicare HMO | Admitting: Family Medicine

## 2022-03-02 VITALS — BP 138/88 | HR 94 | Ht 65.0 in | Wt 184.1 lb

## 2022-03-02 DIAGNOSIS — I10 Essential (primary) hypertension: Secondary | ICD-10-CM

## 2022-03-02 NOTE — Patient Instructions (Signed)
I appreciate the opportunity to provide care to you today!    Follow up:  3 months  Labs: please stop by the lab today to get your blood drawn (CBC, CMP, TSH, Lipid profile, HgA1c, Vit D)    Please continue to a heart-healthy diet and increase your physical activities. Try to exercise for 58mns at least three times a week.      It was a pleasure to see you and I look forward to continuing to work together on your health and well-being. Please do not hesitate to call the office if you need care or have questions about your care.   Have a wonderful day and week. With Gratitude, GAlvira MondayMSN, FNP-BC

## 2022-03-02 NOTE — Progress Notes (Signed)
Established Patient Office Visit  Subjective:  Patient ID: Sara Terry, female    DOB: [birth year]  Age: 76 y.o. MRN: [id number]  CC:  Chief Complaint  Patient presents with   Follow-up    2 week bp f/u. Pt reports doing well.     HPI Sara Terry is a 76 y.o. female with past medical history of  presents for f/u of essential hypertension, hyperlipidemia, and osteoarthritis of the knee and hip chronic medical conditions. For the details of today's visit, please refer to the assessment and plan.     Past Medical History:  Diagnosis Date   Arthritis    right hip and right knee (had right hip replacement)   Atrial fibrillation (HCC)    Essential hypertension    GERD (gastroesophageal reflux disease)    Glucose intolerance (impaired glucose tolerance)    Insomnia    Raynaud phenomenon    Skin cancer    Forehead    Past Surgical History:  Procedure Laterality Date   ABDOMINOPLASTY     BREAST REDUCTION SURGERY  2003   COLONOSCOPY     COLONOSCOPY N/A 06/13/2013   Procedure: COLONOSCOPY;  Surgeon: Rogene Houston, MD;  [facility];  Service: Endoscopy;  Laterality: N/A;  930   FOOT SURGERY Left 02/09/2016   TONSILLECTOMY     TOTAL HIP ARTHROPLASTY Right 04/27/2016   Procedure: RIGHT TOTAL HIP ARTHROPLASTY ANTERIOR APPROACH;  Surgeon: Gaynelle Arabian, MD;  Location: [facility];  Service: Orthopedics;  Laterality: Right;   TUBAL LIGATION      Family History  Problem Relation Age of Onset   Hypertension Mother    Hypertension Father    Diabetes Father    Heart attack Father    Hypertension Brother     Social History   Socioeconomic History   Marital status: Married    Spouse name: Not on file   Number of children: 2   Years of education: Not on file   Highest education level: Not on file  Occupational History   Occupation: [place]- [employer] now    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[employer]  Tobacco Use   Smoking status: Former    Types:  Cigarettes    Start date: 03/07/1964    Quit date: 03/07/1978    Years since quitting: 44.0   Smokeless tobacco: Never  Vaping Use   Vaping Use: Never used  Substance and Sexual Activity   Alcohol use: Yes    Comment: 2.5 glasses of wine daily   Drug use: No   Sexual activity: Not on file  Other Topics Concern   Not on file  Social History Narrative   1 child at [employer] and another in [place], [place]   Social Determinants of Health   Financial Resource Strain: [place]  (11/01/2021)   Overall Financial Resource Strain (CARDIA)    Difficulty of Paying Living Expenses: Not hard at all  Food Insecurity: No Food Insecurity (11/01/2021)   Hunger Vital Sign    Worried About Running Out of Food in the Last Year: Never true    [place] in the Last Year: Never true  Transportation Needs: No Transportation Needs (11/01/2021)   PRAPARE - Hydrologist (Medical): No    Lack of Transportation (Non-Medical): No  Physical Activity: Insufficiently Active (11/01/2021)   Exercise Vital Sign    Days of Exercise per Week: 3 days  Minutes of Exercise per Session: 30 min  Stress: No Stress Concern Present (11/01/2021)   [place]    Feeling of Stress : Not at all  Social Connections: Moderately Integrated (11/01/2021)   Social Connection and Isolation Panel [NHANES]    Frequency of Communication with Friends and Family: More than three times a week    Frequency of Social Gatherings with Friends and Family: More than three times a week    Attends Religious Services: More than 4 times per year    Active Member of [employer] or Organizations: No    Attends Archivist Meetings: Never    Marital Status: Married  Human resources officer Violence: Not At Risk (11/01/2021)   Humiliation, Afraid, Rape, and Kick questionnaire    Fear of Current or Ex-Partner: No    Emotionally Abused: No    Physically  Abused: No    Sexually Abused: No    Outpatient Medications Prior to Visit  Medication Sig Dispense Refill   apixaban (ELIQUIS) 5 MG TABS tablet Take 1 tablet (5 mg total) by mouth 2 (two) times daily. 180 tablet 1   atorvastatin (LIPITOR) 40 MG tablet TAKE 1 TABLET DAILY AT 6PM 90 tablet 3   calcium carbonate (OS-CAL - DOSED IN MG OF ELEMENTAL CALCIUM) 1250 (500 Ca) MG tablet Take 1 tablet by mouth 2 (two) times daily with a meal.      diltiazem (CARDIZEM CD) 360 MG 24 hr capsule TAKE 1 CAPSULE DAILY (DOSE INCREASE 06/25/2021) 90 capsule 1   docusate sodium (COLACE) 100 MG capsule Take 100 mg by mouth daily.     enalapril (VASOTEC) 20 MG tablet Take 1 tablet (20 mg total) by mouth daily. 90 tablet 3   furosemide (LASIX) 20 MG tablet Take 1 tablet (20 mg total) by mouth as needed for edema. 30 tablet 11   hydrochlorothiazide (HYDRODIURIL) 25 MG tablet Take 1 tablet (25 mg total) by mouth every morning. 90 tablet 3   zolpidem (AMBIEN) 10 MG tablet Take 0.5 tablets (5 mg total) by mouth at bedtime as needed for sleep. 30 tablet 0   No facility-administered medications prior to visit.    Allergies  Allergen Reactions   Maxzide [Triamterene-Hctz]    Penicillins Hives    Has patient had a PCN reaction causing immediate rash, facial/tongue/throat swelling, SOB or lightheadedness with hypotension: Yes Has patient had a PCN reaction causing severe rash involving mucus membranes or skin necrosis: No Has patient had a PCN reaction that required hospitalization No Has patient had a PCN reaction occurring within the last 10 years: No If all of the above answers are "NO", then may proceed with Cephalosporin use.     ROS Review of Systems  Constitutional:  Negative for chills and fever.  Eyes:  Negative for visual disturbance.  Respiratory:  Negative for chest tightness and shortness of breath.   Neurological:  Negative for dizziness and headaches.      Objective:    Physical Exam HENT:      Head: Normocephalic.     Mouth/Throat:     Mouth: Mucous membranes are moist.  Cardiovascular:     Rate and Rhythm: Normal rate.     Heart sounds: Normal heart sounds.  Pulmonary:     Effort: Pulmonary effort is normal.     Breath sounds: Normal breath sounds.  Neurological:     Mental Status: She is alert.     BP 138/88  Pulse 94   Ht _0  (1.651 m)   Wt 184 lb 1.9 oz (83.5 kg)   SpO2 97%   BMI 30.64 kg/m  Wt Readings from Last 3 Encounters:  03/02/22 184 lb 1.9 oz (83.5 kg)  02/09/22 185 lb (83.9 kg)  09/28/21 186 lb 9.6 oz (84.6 kg)    Lab Results  Component Value Date   TSH 2.000 02/04/2022   Lab Results  Component Value Date   WBC 4.3 02/04/2022   HGB 12.7 02/04/2022   HCT 36.4 02/04/2022   MCV 96 02/04/2022   PLT 284 02/04/2022   Lab Results  Component Value Date   NA 138 02/04/2022   K 4.5 02/04/2022   CO2 25 02/04/2022   GLUCOSE 113 (H) 02/04/2022   BUN 11 02/04/2022   CREATININE 0.55 (L) 02/04/2022   BILITOT 0.4 02/04/2022   ALKPHOS 66 02/04/2022   AST 15 02/04/2022   ALT 11 02/04/2022   PROT 5.9 (L) 02/04/2022   ALBUMIN 4.0 02/04/2022   CALCIUM 9.1 02/04/2022   ANIONGAP 8 04/12/2018   EGFR 96 02/04/2022   Lab Results  Component Value Date   CHOL 151 02/04/2022   Lab Results  Component Value Date   HDL 75 02/04/2022   Lab Results  Component Value Date   LDLCALC 66 02/04/2022   Lab Results  Component Value Date   TRIG 46 02/04/2022   Lab Results  Component Value Date   CHOLHDL 2.0 02/04/2022   Lab Results  Component Value Date   HGBA1C 5.8 (H) 02/04/2022      Assessment & Plan:  Essential hypertension, benign Assessment & Plan: Controlled She takes in the enalapril 20 mg daily, furosemide 20 mg as needed, hydrochlorothiazide 25 mg daily and diltiazem 360 mg daily She denies headaches, dizziness, blurred vision She is compliant with treatment regimen BP Readings from Last 3 Encounters:  03/02/22 138/88  02/09/22  (!) 150/82  09/28/21 138/74        Follow-up: Return in about 3 months (around 06/01/2022).   Alvira Monday, FNP

## 2022-03-02 NOTE — Assessment & Plan Note (Signed)
Controlled She takes in the enalapril 20 mg daily, furosemide 20 mg as needed, hydrochlorothiazide 25 mg daily and diltiazem 360 mg daily She denies headaches, dizziness, blurred vision She is compliant with treatment regimen BP Readings from Last 3 Encounters:  03/02/22 138/88  02/09/22 (!) 150/82  09/28/21 138/74

## 2022-03-07 DIAGNOSIS — R69 Illness, unspecified: Secondary | ICD-10-CM | POA: Diagnosis not present

## 2022-03-16 ENCOUNTER — Other Ambulatory Visit: Payer: Self-pay

## 2022-04-07 DIAGNOSIS — R69 Illness, unspecified: Secondary | ICD-10-CM | POA: Diagnosis not present

## 2022-05-06 DIAGNOSIS — R69 Illness, unspecified: Secondary | ICD-10-CM | POA: Diagnosis not present

## 2022-05-19 ENCOUNTER — Other Ambulatory Visit: Payer: Self-pay | Admitting: Cardiology

## 2022-06-01 ENCOUNTER — Encounter: Payer: Self-pay | Admitting: Family Medicine

## 2022-06-01 ENCOUNTER — Ambulatory Visit (INDEPENDENT_AMBULATORY_CARE_PROVIDER_SITE_OTHER): Payer: Medicare HMO | Admitting: Family Medicine

## 2022-06-01 VITALS — BP 129/78 | HR 96 | Ht 65.5 in | Wt 180.0 lb

## 2022-06-01 DIAGNOSIS — E559 Vitamin D deficiency, unspecified: Secondary | ICD-10-CM

## 2022-06-01 DIAGNOSIS — I10 Essential (primary) hypertension: Secondary | ICD-10-CM

## 2022-06-01 DIAGNOSIS — R7301 Impaired fasting glucose: Secondary | ICD-10-CM | POA: Diagnosis not present

## 2022-06-01 DIAGNOSIS — E7849 Other hyperlipidemia: Secondary | ICD-10-CM

## 2022-06-01 DIAGNOSIS — E038 Other specified hypothyroidism: Secondary | ICD-10-CM | POA: Diagnosis not present

## 2022-06-01 NOTE — Patient Instructions (Addendum)
I appreciate the opportunity to provide care to you today!    Follow up:  3 months  Labs: please stop by the lab today to get your blood drawn (CBC, CMP, TSH, Lipid profile, HgA1c, Vit D)    Please continue to a heart-healthy diet and increase your physical activities. Try to exercise for 34mins at least five days a week.   Physical activity helps: Lower your blood glucose, improve your heart health, lower your blood pressure and cholesterol, burn calories to help manage her weight, gave you energy, lower stress, and improve his sleep.  The American diabetes Association (ADA) recommends being active for 2-1/2 hours (150 minutes) or more week.  Exercise for 30 minutes, 5 days a week (150 minutes total)    It was a pleasure to see you and I look forward to continuing to work together on your health and well-being. Please do not hesitate to call the office if you need care or have questions about your care.   Have a wonderful day and week. With Gratitude, Alvira Monday MSN, FNP-BC

## 2022-06-01 NOTE — Assessment & Plan Note (Signed)
She takes atorvastatin 40 mg daily Denies muscle aches and pain Reports compliant with treatment regimen Pending lipid panel

## 2022-06-01 NOTE — Assessment & Plan Note (Signed)
Controlled She takes in the enalapril 20 mg daily, furosemide 20 mg as needed, hydrochlorothiazide 25 mg daily and diltiazem 360 mg daily She denies headaches, dizziness, blurred vision She is compliant with treatment regimen BP Readings from Last 3 Encounters:  06/01/22 129/78  03/02/22 138/88  02/09/22 (!) 150/82

## 2022-06-01 NOTE — Progress Notes (Signed)
Established Patient Office Visit  Subjective:  Patient ID: Sara Terry, female    DOB: 20-Aug-1945  Age: 77 y.o. MRN: [id number]  CC:  Chief Complaint  Patient presents with   Hypertension    Follow up    HPI Sara Terry is a 77 y.o. female with past medical history of hypertension and hyperlipidemia presents for f/u of  chronic medical conditions. For the details of today's visit, please refer to the assessment and plan.     Past Medical History:  Diagnosis Date   Arthritis    right hip and right knee (had right hip replacement)   Atrial fibrillation (HCC)    Essential hypertension    GERD (gastroesophageal reflux disease)    Glucose intolerance (impaired glucose tolerance)    Insomnia    Raynaud phenomenon    Skin cancer    Forehead    Past Surgical History:  Procedure Laterality Date   ABDOMINOPLASTY     BREAST REDUCTION SURGERY  2003   COLONOSCOPY     COLONOSCOPY N/A 06/13/2013   Procedure: COLONOSCOPY;  Surgeon: Rogene Houston, MD;  [facility];  Service: Endoscopy;  Laterality: N/A;  930   FOOT SURGERY Left 02/09/2016   TONSILLECTOMY     TOTAL HIP ARTHROPLASTY Right 04/27/2016   Procedure: RIGHT TOTAL HIP ARTHROPLASTY ANTERIOR APPROACH;  Surgeon: Gaynelle Arabian, MD;  Location: [facility];  Service: Orthopedics;  Laterality: Right;   TUBAL LIGATION      Family History  Problem Relation Age of Onset   Hypertension Mother    Hypertension Father    Diabetes Father    Heart attack Father    Hypertension Brother     Social History   Socioeconomic History   Marital status: Married    Spouse name: Not on file   Number of children: 2   Years of education: Not on file   Highest education level: Associate degree: occupational, Hotel manager, or vocational program  Occupational History   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[employer]  Tobacco Use   Smoking status: Former    Types: Cigarettes    Start  date: 03/07/1964    Quit date: 03/07/1978    Years since quitting: 44.2   Smokeless tobacco: Never  Vaping Use   Vaping Use: Never used  Substance and Sexual Activity   Alcohol use: Yes    Comment: 2.5 glasses of wine daily   Drug use: No   Sexual activity: Not on file  Other Topics Concern   Not on file  Social History Narrative   1 child at [employer] and another in [place], [place] Determinants of Health   Financial Resource Strain: [place]  (05/28/2022)   Overall Financial Resource Strain (CARDIA)    Difficulty of Paying Living Expenses: Not hard at all  Food Insecurity: No Food Insecurity (05/28/2022)   Hunger Vital Sign    Worried About Running Out of Food in the Last Year: Never true    [employer] in the Last Year: Never true  Transportation Needs: No Transportation Needs (05/28/2022)   PRAPARE - Hydrologist (Medical): No    Lack of Transportation (Non-Medical): No  Physical Activity: Sufficiently Active (05/28/2022)   Exercise Vital Sign    Days of Exercise per Week: 3 days    Minutes of Exercise per Session: 50 min  Stress:  No Stress Concern Present (05/28/2022)   [place]    Feeling of Stress : Not at all  Social Connections: [place] (05/28/2022)   Social Connection and Isolation Panel [NHANES]    Frequency of Communication with Friends and Family: Three times a week    Frequency of Social Gatherings with Friends and Family: Once a week    Attends Religious Services: More than 4 times per year    Active Member of [employer] or Organizations: Yes    Attends Music therapist: More than 4 times per year    Marital Status: Married  Human resources officer Violence: Not At Risk (11/01/2021)   Humiliation, Afraid, Rape, and Kick questionnaire    Fear of Current or Ex-Partner: No    Emotionally Abused: No    Physically Abused: No    Sexually Abused: No     Outpatient Medications Prior to Visit  Medication Sig Dispense Refill   apixaban (ELIQUIS) 5 MG TABS tablet Take 1 tablet (5 mg total) by mouth 2 (two) times daily. 180 tablet 1   atorvastatin (LIPITOR) 40 MG tablet TAKE 1 TABLET DAILY AT 6PM 90 tablet 3   calcium carbonate (OS-CAL - DOSED IN MG OF ELEMENTAL CALCIUM) 1250 (500 Ca) MG tablet Take 1 tablet by mouth 2 (two) times daily with a meal.      diltiazem (CARDIZEM CD) 360 MG 24 hr capsule TAKE 1 CAPSULE DAILY (DOSE INCREASE 06/25/2021) 90 capsule 1   docusate sodium (COLACE) 100 MG capsule Take 100 mg by mouth daily.     enalapril (VASOTEC) 20 MG tablet Take 1 tablet (20 mg total) by mouth daily. 90 tablet 3   furosemide (LASIX) 20 MG tablet Take 1 tablet (20 mg total) by mouth as needed for edema. 30 tablet 11   hydrochlorothiazide (HYDRODIURIL) 25 MG tablet Take 1 tablet (25 mg total) by mouth every morning. 90 tablet 3   zolpidem (AMBIEN) 10 MG tablet Take 0.5 tablets (5 mg total) by mouth at bedtime as needed for sleep. 30 tablet 0   No facility-administered medications prior to visit.    Allergies  Allergen Reactions   Maxzide [Triamterene-Hctz]    Penicillins Hives    Has patient had a PCN reaction causing immediate rash, facial/tongue/throat swelling, SOB or lightheadedness with hypotension: Yes Has patient had a PCN reaction causing severe rash involving mucus membranes or skin necrosis: No Has patient had a PCN reaction that required hospitalization No Has patient had a PCN reaction occurring within the last 10 years: No If all of the above answers are "NO", then may proceed with Cephalosporin use.     ROS Review of Systems  Constitutional:  Negative for chills and fever.  Eyes:  Negative for visual disturbance.  Respiratory:  Negative for chest tightness and shortness of breath.   Neurological:  Negative for dizziness and headaches.      Objective:    Physical Exam HENT:     Head: Normocephalic.      Mouth/Throat:     Mouth: Mucous membranes are moist.  Cardiovascular:     Rate and Rhythm: Normal rate.     Heart sounds: Normal heart sounds.  Pulmonary:     Effort: Pulmonary effort is normal.     Breath sounds: Normal breath sounds.  Neurological:     Mental Status: She is alert.     BP 129/78   Pulse 96   Ht 5' 5.5" (1.664 m)  Wt 180 lb 0.6 oz (81.7 kg)   SpO2 97%   BMI 29.50 kg/m  Wt Readings from Last 3 Encounters:  06/01/22 180 lb 0.6 oz (81.7 kg)  03/02/22 184 lb 1.9 oz (83.5 kg)  02/09/22 185 lb (83.9 kg)    Lab Results  Component Value Date   TSH 2.000 02/04/2022   Lab Results  Component Value Date   WBC 4.3 02/04/2022   HGB 12.7 02/04/2022   HCT 36.4 02/04/2022   MCV 96 02/04/2022   PLT 284 02/04/2022   Lab Results  Component Value Date   NA 138 02/04/2022   K 4.5 02/04/2022   CO2 25 02/04/2022   GLUCOSE 113 (H) 02/04/2022   BUN 11 02/04/2022   CREATININE 0.55 (L) 02/04/2022   BILITOT 0.4 02/04/2022   ALKPHOS 66 02/04/2022   AST 15 02/04/2022   ALT 11 02/04/2022   PROT 5.9 (L) 02/04/2022   ALBUMIN 4.0 02/04/2022   CALCIUM 9.1 02/04/2022   ANIONGAP 8 04/12/2018   EGFR 96 02/04/2022   Lab Results  Component Value Date   CHOL 151 02/04/2022   Lab Results  Component Value Date   HDL 75 02/04/2022   Lab Results  Component Value Date   LDLCALC 66 02/04/2022   Lab Results  Component Value Date   TRIG 46 02/04/2022   Lab Results  Component Value Date   CHOLHDL 2.0 02/04/2022   Lab Results  Component Value Date   HGBA1C 5.8 (H) 02/04/2022      Assessment & Plan:  Essential hypertension, benign Assessment & Plan: Controlled She takes in the enalapril 20 mg daily, furosemide 20 mg as needed, hydrochlorothiazide 25 mg daily and diltiazem 360 mg daily She denies headaches, dizziness, blurred vision She is compliant with treatment regimen BP Readings from Last 3 Encounters:  06/01/22 129/78  03/02/22 138/88  02/09/22 (!) 150/82       Other hyperlipidemia Assessment & Plan: She takes atorvastatin 40 mg daily Denies muscle aches and pain Reports compliant with treatment regimen Pending lipid panel  Orders: -     Lipid panel -     CMP14+EGFR -     CBC with Differential/Platelet  IFG (impaired fasting glucose) -     Hemoglobin A1c  Vitamin D deficiency -     VITAMIN D 25 Hydroxy (Vit-D Deficiency, Fractures)  Other specified hypothyroidism -     TSH + free T4    Follow-up: Return in about 3 months (around 09/01/2022).   Alvira Monday, FNP

## 2022-06-03 DIAGNOSIS — E559 Vitamin D deficiency, unspecified: Secondary | ICD-10-CM | POA: Diagnosis not present

## 2022-06-03 DIAGNOSIS — E038 Other specified hypothyroidism: Secondary | ICD-10-CM | POA: Diagnosis not present

## 2022-06-03 DIAGNOSIS — R7301 Impaired fasting glucose: Secondary | ICD-10-CM | POA: Diagnosis not present

## 2022-06-03 DIAGNOSIS — E7849 Other hyperlipidemia: Secondary | ICD-10-CM | POA: Diagnosis not present

## 2022-06-04 LAB — CBC WITH DIFFERENTIAL/PLATELET
Basophils Absolute: 0.1 10*3/uL (ref 0.0–0.2)
Basos: 1 %
EOS (ABSOLUTE): 0.1 10*3/uL (ref 0.0–0.4)
Eos: 2 %
Hematocrit: 38.7 % (ref 34.0–46.6)
Hemoglobin: 13.3 g/dL (ref 11.1–15.9)
Immature Grans (Abs): 0 10*3/uL (ref 0.0–0.1)
Immature Granulocytes: 0 %
Lymphocytes Absolute: 1.9 10*3/uL (ref 0.7–3.1)
Lymphs: 40 %
MCH: 32.8 pg (ref 26.6–33.0)
MCHC: 34.4 g/dL (ref 31.5–35.7)
MCV: 96 fL (ref 79–97)
Monocytes Absolute: 0.4 10*3/uL (ref 0.1–0.9)
Monocytes: 9 %
Neutrophils Absolute: 2.2 10*3/uL (ref 1.4–7.0)
Neutrophils: 48 %
Platelets: 320 10*3/uL (ref 150–450)
RBC: 4.05 x10E6/uL (ref 3.77–5.28)
RDW: 11.8 % (ref 11.7–15.4)
WBC: 4.7 10*3/uL (ref 3.4–10.8)

## 2022-06-04 LAB — CMP14+EGFR
ALT: 15 IU/L (ref 0–32)
AST: 17 IU/L (ref 0–40)
Albumin/Globulin Ratio: 2 (ref 1.2–2.2)
Albumin: 4.1 g/dL (ref 3.8–4.8)
Alkaline Phosphatase: 76 IU/L (ref 44–121)
BUN/Creatinine Ratio: 17 (ref 12–28)
BUN: 12 mg/dL (ref 8–27)
Bilirubin Total: 0.4 mg/dL (ref 0.0–1.2)
CO2: 23 mmol/L (ref 20–29)
Calcium: 9.5 mg/dL (ref 8.7–10.3)
Chloride: 96 mmol/L (ref 96–106)
Creatinine, Ser: 0.7 mg/dL (ref 0.57–1.00)
Globulin, Total: 2.1 g/dL (ref 1.5–4.5)
Glucose: 107 mg/dL — ABNORMAL HIGH (ref 70–99)
Potassium: 4.6 mmol/L (ref 3.5–5.2)
Sodium: 136 mmol/L (ref 134–144)
Total Protein: 6.2 g/dL (ref 6.0–8.5)
eGFR: 90 mL/min/{1.73_m2} (ref >59)

## 2022-06-04 LAB — LIPID PANEL
Chol/HDL Ratio: 2 ratio (ref 0.0–4.4)
Cholesterol, Total: 155 mg/dL (ref 100–199)
HDL: 76 mg/dL (ref >39)
LDL Chol Calc (NIH): 67 mg/dL (ref 0–99)
Triglycerides: 58 mg/dL (ref 0–149)
VLDL Cholesterol Cal: 12 mg/dL (ref 5–40)

## 2022-06-04 LAB — VITAMIN D 25 HYDROXY (VIT D DEFICIENCY, FRACTURES): Vit D, 25-Hydroxy: 49.4 ng/mL (ref 30.0–100.0)

## 2022-06-04 LAB — TSH+FREE T4
Free T4: 1.42 ng/dL (ref 0.82–1.77)
TSH: 2.18 u[IU]/mL (ref 0.450–4.500)

## 2022-06-04 LAB — HEMOGLOBIN A1C
Est. average glucose Bld gHb Est-mCnc: 123 mg/dL
Hgb A1c MFr Bld: 5.9 % — ABNORMAL HIGH (ref 4.8–5.6)

## 2022-06-06 DIAGNOSIS — R69 Illness, unspecified: Secondary | ICD-10-CM | POA: Diagnosis not present

## 2022-06-14 DIAGNOSIS — Z85828 Personal history of other malignant neoplasm of skin: Secondary | ICD-10-CM | POA: Diagnosis not present

## 2022-06-14 DIAGNOSIS — Z08 Encounter for follow-up examination after completed treatment for malignant neoplasm: Secondary | ICD-10-CM | POA: Diagnosis not present

## 2022-06-14 DIAGNOSIS — D225 Melanocytic nevi of trunk: Secondary | ICD-10-CM | POA: Diagnosis not present

## 2022-06-14 DIAGNOSIS — Z1283 Encounter for screening for malignant neoplasm of skin: Secondary | ICD-10-CM | POA: Diagnosis not present

## 2022-06-23 ENCOUNTER — Other Ambulatory Visit: Payer: Self-pay | Admitting: Cardiology

## 2022-07-06 DIAGNOSIS — R69 Illness, unspecified: Secondary | ICD-10-CM | POA: Diagnosis not present

## 2022-07-14 ENCOUNTER — Other Ambulatory Visit: Payer: Self-pay | Admitting: Cardiology

## 2022-08-06 DIAGNOSIS — R69 Illness, unspecified: Secondary | ICD-10-CM | POA: Diagnosis not present

## 2022-08-08 DIAGNOSIS — H5203 Hypermetropia, bilateral: Secondary | ICD-10-CM | POA: Diagnosis not present

## 2022-09-02 ENCOUNTER — Ambulatory Visit: Payer: Medicare HMO | Admitting: Family Medicine

## 2022-09-05 ENCOUNTER — Ambulatory Visit: Payer: Medicare HMO | Admitting: Family Medicine

## 2022-09-05 DIAGNOSIS — R69 Illness, unspecified: Secondary | ICD-10-CM | POA: Diagnosis not present

## 2022-09-06 ENCOUNTER — Encounter: Payer: Self-pay | Admitting: Family Medicine

## 2022-09-06 ENCOUNTER — Ambulatory Visit (INDEPENDENT_AMBULATORY_CARE_PROVIDER_SITE_OTHER): Payer: Medicare HMO | Admitting: Family Medicine

## 2022-09-06 VITALS — BP 138/89 | HR 93 | Ht 65.0 in | Wt 184.0 lb

## 2022-09-06 DIAGNOSIS — R600 Localized edema: Secondary | ICD-10-CM

## 2022-09-06 DIAGNOSIS — E669 Obesity, unspecified: Secondary | ICD-10-CM

## 2022-09-06 DIAGNOSIS — I10 Essential (primary) hypertension: Secondary | ICD-10-CM | POA: Diagnosis not present

## 2022-09-06 DIAGNOSIS — E7849 Other hyperlipidemia: Secondary | ICD-10-CM

## 2022-09-06 DIAGNOSIS — E559 Vitamin D deficiency, unspecified: Secondary | ICD-10-CM | POA: Diagnosis not present

## 2022-09-06 DIAGNOSIS — E66811 Obesity, class 1: Secondary | ICD-10-CM

## 2022-09-06 DIAGNOSIS — E785 Hyperlipidemia, unspecified: Secondary | ICD-10-CM

## 2022-09-06 DIAGNOSIS — R7301 Impaired fasting glucose: Secondary | ICD-10-CM

## 2022-09-06 DIAGNOSIS — E1169 Type 2 diabetes mellitus with other specified complication: Secondary | ICD-10-CM | POA: Diagnosis not present

## 2022-09-06 DIAGNOSIS — E038 Other specified hypothyroidism: Secondary | ICD-10-CM | POA: Diagnosis not present

## 2022-09-06 NOTE — Assessment & Plan Note (Signed)
She takes atorvastatin 40 mg daily Denies muscle aches and pain Reports compliant with treatment regimen Pending lipid panel Lab Results  Component Value Date   CHOL 155 06/03/2022   HDL 76 06/03/2022   LDLCALC 67 06/03/2022   TRIG 58 06/03/2022   CHOLHDL 2.0 06/03/2022

## 2022-09-06 NOTE — Patient Instructions (Addendum)
I appreciate the opportunity to provide care to you today!    Follow up:  3 months  Labs: please stop by the lab during the week to get your blood drawn (CBC, CMP, TSH, Lipid profile, HgA1c, Vit D)  Ankle swelling I recommend daily elevation of 20-30 minutes above level of heart, daily compression stocking use, exercise, refraining from prolonged sitting or standing.   I also recommend diet changes to reduce the amount of salt in the food that you eat   Healthy tips for weight loss Eat three meals per day at times discussed. Cut out all diet bevergages and drink only water Eat whole food plant based meals Cut out junk food, fast food and processed foods Exercise 150 minutes a week Lose 1-2 lbs per week. Keep a food journal Choose foods that grow in a garden or in a fruit orchard and protein of animals with fins or feathers.  Lifestyle Medicine  - Whole Food, Plant Predominant Nutrition is highly recommended: Eat Plenty of vegetables, Mushrooms, fruits, Legumes, Whole Grains, Nuts, seeds in lieu of processed meats, processed snacks/pastries red meat, poultry, eggs.  -It is better to avoid simple carbohydrates including: Cakes, Sweet Desserts, Ice Cream, Soda (diet and regular), Sweet Tea, Candies, Chips, Cookies, Store Bought Juices, Alcohol in Excess of  1-2 drinks a day, Lemonade,  Artificial Sweeteners, Doughnuts, Coffee Creamers, "Sugar-free" Products, etc, etc.  This is not a complete list.....  Exercise: If you are able: 30 -60 minutes a day ,4 days a week, or 150 minutes a week.  The longer the better.  Combine stretch, strength, and aerobic activities.  If you were told in the past that you have high risk for cardiovascular diseases, you may seek evaluation by your heart doctor prior to initiating moderate to intense exercise programs.  Please continue to a heart-healthy diet and increase your physical activities. Try to exercise for at least five days a week.      It was  a pleasure to see you and I look forward to continuing to work together on your health and well-being. Please do not hesitate to call the office if you need care or have questions about your care.   Have a wonderful day and week. With Gratitude, Gilmore Laroche MSN, FNP-BC

## 2022-09-06 NOTE — Assessment & Plan Note (Signed)
Patient would like to discuss weight loss options, noting that her daughter-in-law 40 pounds while taking Wegovy Review my weight manage plan with the patient and encouraged to implement lifestyle changes for at least 3 months Also advised the patient to verify with her insurance if [facility] L[facility] is covered

## 2022-09-06 NOTE — Progress Notes (Signed)
Established Patient Office Visit  Subjective:  Patient ID: Sara Terry, female    DOB: [birth year]  Age: 77 y.o. MRN: [id number]  CC:  Chief Complaint  Patient presents with   Chronic Care Management    3 month f/u, would like to discuss weight. Also having swelling in ankles    HPI Sara Terry is a 77 y.o. female with past medical history of hypertension, hyperlipidemia and osteoarthritis presents for f/u of  chronic medical conditions. For the details of today's visit, please refer to the assessment and plan.     Past Medical History:  Diagnosis Date   Arthritis    right hip and right knee (had right hip replacement)   Atrial fibrillation (HCC)    Essential hypertension    GERD (gastroesophageal reflux disease)    Glucose intolerance (impaired glucose tolerance)    Insomnia    Raynaud phenomenon    Skin cancer    Forehead    Past Surgical History:  Procedure Laterality Date   ABDOMINOPLASTY     BREAST REDUCTION SURGERY  2003   COLONOSCOPY     COLONOSCOPY N/A 06/13/2013   Procedure: COLONOSCOPY;  Surgeon: Malissa Hippo, MD;  [facility];  Service: Endoscopy;  Laterality: N/A;  930   FOOT SURGERY Left 02/09/2016   TONSILLECTOMY     TOTAL HIP ARTHROPLASTY Right 04/27/2016   Procedure: RIGHT TOTAL HIP ARTHROPLASTY ANTERIOR APPROACH;  Surgeon: Ollen Gross, MD;  Location: [facility];  Service: Orthopedics;  Laterality: Right;   TUBAL LIGATION      Family History  Problem Relation Age of Onset   Hypertension Mother    Hypertension Father    Diabetes Father    Heart attack Father    Hypertension Brother     Social History   Socioeconomic History   Marital status: Married    Spouse name: Not on file   Number of children: 2   Years of education: Not on file   Highest education level: Associate degree: occupational, Scientist, product/process development, or vocational program  Occupational History   Occupation: [employer]- answers phones PT now    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 [employer]  Tobacco Use   Smoking status: Former    Types: Cigarettes    Start date: 03/07/1964    Quit date: 03/07/1978    Years since quitting: 44.5   Smokeless tobacco: Never  Vaping Use   Vaping Use: Never used  Substance and Sexual Activity   Alcohol use: Yes    Comment: 2.5 glasses of wine daily   Drug use: No   Sexual activity: Not on file  Other Topics Concern   Not on file  Social History Narrative   1 child at [employer] and another in [place], [place]   Social Determinants of Health   Financial Resource Strain: Low Risk  (05/28/2022)   Overall Financial Resource Strain (CARDIA)    Difficulty of Paying Living Expenses: Not hard at all  Food Insecurity: No Food Insecurity (05/28/2022)   Hunger Vital Sign    Worried About Running Out of Food in the Last Year: Never true    Ran Out of Food in the Last Year: Never true  Transportation Needs: No Transportation Needs (05/28/2022)   PRAPARE - Administrator, Civil Service (Medical): No    Lack of Transportation (Non-Medical): No  Physical Activity: Sufficiently Active (05/28/2022)   Exercise Vital Sign    Days of Exercise per Week:  3 days    Minutes of Exercise per Session: 50 min  Stress: No Stress Concern Present (05/28/2022)   [employer] of Occupational Health - Occupational Stress Questionnaire    Feeling of Stress : Not at all  Social Connections: [place] (05/28/2022)   Social Connection and Isolation Panel [NHANES]    Frequency of Communication with Friends and Family: Three times a week    Frequency of Social Gatherings with Friends and Family: Once a week    Attends Religious Services: More than 4 times per year    Active Member of [employer] or Organizations: Yes    Attends Engineer, structural: More than 4 times per year    Marital Status: Married  Catering manager Violence: Not At Risk (11/01/2021)   Humiliation, Afraid, Rape, and Kick questionnaire    Fear of  Current or Ex-Partner: No    Emotionally Abused: No    Physically Abused: No    Sexually Abused: No    Outpatient Medications Prior to Visit  Medication Sig Dispense Refill   apixaban (ELIQUIS) 5 MG TABS tablet Take 1 tablet (5 mg total) by mouth 2 (two) times daily. 180 tablet 1   atorvastatin (LIPITOR) 40 MG tablet TAKE 1 TABLET DAILY AT 6PM 90 tablet 3   calcium carbonate (OS-CAL - DOSED IN MG OF ELEMENTAL CALCIUM) 1250 (500 Ca) MG tablet Take 1 tablet by mouth 2 (two) times daily with a meal.      diltiazem (CARDIZEM CD) 360 MG 24 hr capsule TAKE 1 CAPSULE DAILY (DOSE INCREASE 06/25/2021) 90 capsule 1   docusate sodium (COLACE) 100 MG capsule Take 100 mg by mouth daily.     enalapril (VASOTEC) 20 MG tablet TAKE 1 TABLET DAILY 90 tablet 3   furosemide (LASIX) 20 MG tablet Take 1 tablet (20 mg total) by mouth as needed for edema. 30 tablet 11   hydrochlorothiazide (HYDRODIURIL) 25 MG tablet TAKE 1 TABLET EVERY MORNING 90 tablet 3   zolpidem (AMBIEN) 10 MG tablet Take 0.5 tablets (5 mg total) by mouth at bedtime as needed for sleep. 30 tablet 0   No facility-administered medications prior to visit.    Allergies  Allergen Reactions   Maxzide [Triamterene-Hctz]    Penicillins Hives    Has patient had a PCN reaction causing immediate rash, facial/tongue/throat swelling, SOB or lightheadedness with hypotension: Yes Has patient had a PCN reaction causing severe rash involving mucus membranes or skin necrosis: No Has patient had a PCN reaction that required hospitalization No Has patient had a PCN reaction occurring within the last 10 years: No If all of the above answers are "NO", then may proceed with Cephalosporin use.     ROS Review of Systems  Constitutional:  Negative for chills and fever.  Eyes:  Negative for visual disturbance.  Respiratory:  Negative for chest tightness and shortness of breath.   Neurological:  Negative for dizziness and headaches.      Objective:     Physical Exam HENT:     Head: Normocephalic.     Mouth/Throat:     Mouth: Mucous membranes are moist.  Cardiovascular:     Rate and Rhythm: Normal rate.     Heart sounds: Normal heart sounds.  Pulmonary:     Effort: Pulmonary effort is normal.     Breath sounds: Normal breath sounds.  Musculoskeletal:     Right ankle: Swelling (mild) present. No deformity, ecchymosis or lacerations.     Left ankle: Swelling (mild)  present. No deformity, ecchymosis or lacerations. Normal range of motion.  Neurological:     Mental Status: She is alert.     BP 138/89   Pulse 93   Ht 5\' 5"  (1.651 m)   Wt 184 lb (83.5 kg)   SpO2 97%   BMI 30.62 kg/m  Wt Readings from Last 3 Encounters:  09/06/22 184 lb (83.5 kg)  06/01/22 180 lb 0.6 oz (81.7 kg)  03/02/22 184 lb 1.9 oz (83.5 kg)    Lab Results  Component Value Date   TSH 2.180 06/03/2022   Lab Results  Component Value Date   WBC 4.7 06/03/2022   HGB 13.3 06/03/2022   HCT 38.7 06/03/2022   MCV 96 06/03/2022   PLT 320 06/03/2022   Lab Results  Component Value Date   NA 136 06/03/2022   K 4.6 06/03/2022   CO2 23 06/03/2022   GLUCOSE 107 (H) 06/03/2022   BUN 12 06/03/2022   CREATININE 0.70 06/03/2022   BILITOT 0.4 06/03/2022   ALKPHOS 76 06/03/2022   AST 17 06/03/2022   ALT 15 06/03/2022   PROT 6.2 06/03/2022   ALBUMIN 4.1 06/03/2022   CALCIUM 9.5 06/03/2022   ANIONGAP 8 04/12/2018   EGFR 90 06/03/2022   Lab Results  Component Value Date   CHOL 155 06/03/2022   Lab Results  Component Value Date   HDL 76 06/03/2022   Lab Results  Component Value Date   LDLCALC 67 06/03/2022   Lab Results  Component Value Date   TRIG 58 06/03/2022   Lab Results  Component Value Date   CHOLHDL 2.0 06/03/2022   Lab Results  Component Value Date   HGBA1C 5.9 (H) 06/03/2022      Assessment & Plan:  Essential hypertension, benign Assessment & Plan: Controlled She takes in the enalapril 20 mg daily, furosemide 20 mg as  needed, hydrochlorothiazide 25 mg daily and diltiazem 360 mg daily She denies headaches, dizziness, blurred vision She is compliant with treatment regimen BP Readings from Last 3 Encounters:  09/06/22 138/89  06/01/22 129/78  03/02/22 138/88      Other hyperlipidemia Assessment & Plan: She takes atorvastatin 40 mg daily Denies muscle aches and pain Reports compliant with treatment regimen Pending lipid panel Lab Results  Component Value Date   CHOL 155 06/03/2022   HDL 76 06/03/2022   LDLCALC 67 06/03/2022   TRIG 58 06/03/2022   CHOLHDL 2.0 06/03/2022      Obesity (BMI 30.0-34.9) Assessment & Plan: Patient would like to discuss weight loss options, noting that her daughter-in-law 40 pounds while taking Wegovy Review my weight manage plan with the patient and encouraged to implement lifestyle changes for at least 3 months Also advised the patient to verify with her insurance if [facility] is covered   Hyperlipidemia associated with type 2 diabetes mellitus (HCC) -     Lipid panel -     CMP14+EGFR -     CBC with Differential/Platelet  Bilateral lower extremity edema Assessment & Plan: Mild swelling noted of the ankles bilaterally Reports taking furosemide 20 mg as needed for symptom relief She does no prolonged standing Encourage daily elevation of 20-30 minutes above level of heart, daily compression stocking use, exercise, refraining from prolonged sitting or standing.   I also recommend diet changes to reduce the amount of salt in the food that eaten   IFG (impaired fasting glucose) -     Hemoglobin A1c  Vitamin D deficiency -  VITAMIN D 25 Hydroxy (Vit-D Deficiency, Fractures)  Other specified hypothyroidism -     TSH + free T4   Note: This chart has been completed using Engineer, civil (consulting) software, and while attempts have been made to ensure accuracy, certain words and phrases may not be transcribed as intended.   Follow-up: Return in about 3 months  (around 12/07/2022).   Gilmore Laroche, FNP

## 2022-09-06 NOTE — Assessment & Plan Note (Signed)
Controlled She takes in the enalapril 20 mg daily, furosemide 20 mg as needed, hydrochlorothiazide 25 mg daily and diltiazem 360 mg daily She denies headaches, dizziness, blurred vision She is compliant with treatment regimen BP Readings from Last 3 Encounters:  09/06/22 138/89  06/01/22 129/78  03/02/22 138/88

## 2022-09-06 NOTE — Assessment & Plan Note (Addendum)
Mild swelling noted of the ankles bilaterally Reports taking furosemide 20 mg as needed for symptom relief She does no prolonged standing Encourage daily elevation of 20-30 minutes above level of heart, daily compression stocking use, exercise, refraining from prolonged sitting or standing.   I also recommend diet changes to reduce the amount of salt in the food that eaten

## 2022-09-07 DIAGNOSIS — E038 Other specified hypothyroidism: Secondary | ICD-10-CM | POA: Diagnosis not present

## 2022-09-07 DIAGNOSIS — E1169 Type 2 diabetes mellitus with other specified complication: Secondary | ICD-10-CM | POA: Diagnosis not present

## 2022-09-07 DIAGNOSIS — E785 Hyperlipidemia, unspecified: Secondary | ICD-10-CM | POA: Diagnosis not present

## 2022-09-07 DIAGNOSIS — E559 Vitamin D deficiency, unspecified: Secondary | ICD-10-CM | POA: Diagnosis not present

## 2022-09-07 DIAGNOSIS — R7301 Impaired fasting glucose: Secondary | ICD-10-CM | POA: Diagnosis not present

## 2022-09-08 LAB — LIPID PANEL
Chol/HDL Ratio: 2.1 ratio (ref 0.0–4.4)
Cholesterol, Total: 150 mg/dL (ref 100–199)
HDL: 72 mg/dL (ref >39)
LDL Chol Calc (NIH): 67 mg/dL (ref 0–99)
Triglycerides: 48 mg/dL (ref 0–149)
VLDL Cholesterol Cal: 11 mg/dL (ref 5–40)

## 2022-09-08 LAB — HEMOGLOBIN A1C
Est. average glucose Bld gHb Est-mCnc: 126 mg/dL
Hgb A1c MFr Bld: 6 % — ABNORMAL HIGH (ref 4.8–5.6)

## 2022-09-08 LAB — CBC WITH DIFFERENTIAL/PLATELET
Basophils Absolute: 0.1 10*3/uL (ref 0.0–0.2)
Basos: 1 %
EOS (ABSOLUTE): 0.1 10*3/uL (ref 0.0–0.4)
Eos: 2 %
Hematocrit: 37.6 % (ref 34.0–46.6)
Hemoglobin: 13.3 g/dL (ref 11.1–15.9)
Immature Grans (Abs): 0 10*3/uL (ref 0.0–0.1)
Immature Granulocytes: 0 %
Lymphocytes Absolute: 2 10*3/uL (ref 0.7–3.1)
Lymphs: 36 %
MCH: 33.8 pg — ABNORMAL HIGH (ref 26.6–33.0)
MCHC: 35.4 g/dL (ref 31.5–35.7)
MCV: 95 fL (ref 79–97)
Monocytes Absolute: 0.5 10*3/uL (ref 0.1–0.9)
Monocytes: 8 %
Neutrophils Absolute: 3 10*3/uL (ref 1.4–7.0)
Neutrophils: 53 %
Platelets: 285 10*3/uL (ref 150–450)
RBC: 3.94 x10E6/uL (ref 3.77–5.28)
RDW: 11.2 % — ABNORMAL LOW (ref 11.7–15.4)
WBC: 5.6 10*3/uL (ref 3.4–10.8)

## 2022-09-08 LAB — CMP14+EGFR
ALT: 16 IU/L (ref 0–32)
AST: 17 IU/L (ref 0–40)
Albumin: 4.1 g/dL (ref 3.8–4.8)
Alkaline Phosphatase: 77 IU/L (ref 44–121)
BUN/Creatinine Ratio: 19 (ref 12–28)
BUN: 12 mg/dL (ref 8–27)
Bilirubin Total: 0.4 mg/dL (ref 0.0–1.2)
CO2: 24 mmol/L (ref 20–29)
Calcium: 9.1 mg/dL (ref 8.7–10.3)
Chloride: 99 mmol/L (ref 96–106)
Creatinine, Ser: 0.63 mg/dL (ref 0.57–1.00)
Globulin, Total: 1.9 g/dL (ref 1.5–4.5)
Glucose: 110 mg/dL — ABNORMAL HIGH (ref 70–99)
Potassium: 4.6 mmol/L (ref 3.5–5.2)
Sodium: 140 mmol/L (ref 134–144)
Total Protein: 6 g/dL (ref 6.0–8.5)
eGFR: 92 mL/min/{1.73_m2} (ref >59)

## 2022-09-08 LAB — TSH+FREE T4
Free T4: 1.39 ng/dL (ref 0.82–1.77)
TSH: 1.99 u[IU]/mL (ref 0.450–4.500)

## 2022-09-08 LAB — VITAMIN D 25 HYDROXY (VIT D DEFICIENCY, FRACTURES): Vit D, 25-Hydroxy: 58.1 ng/mL (ref 30.0–100.0)

## 2022-09-08 NOTE — Progress Notes (Signed)
Please inform the patient that he hga1c is 6.0. this indicate that she is prediabetic.  I recommend decreasing her intake of high sugar foods and beverages with increased physical activity.

## 2022-09-11 ENCOUNTER — Encounter: Payer: Self-pay | Admitting: Family Medicine

## 2022-09-13 ENCOUNTER — Other Ambulatory Visit: Payer: Self-pay | Admitting: Family Medicine

## 2022-09-13 NOTE — Telephone Encounter (Signed)
Kindly complete the pre-certification

## 2022-10-06 DIAGNOSIS — R69 Illness, unspecified: Secondary | ICD-10-CM | POA: Diagnosis not present

## 2022-10-11 NOTE — Progress Notes (Unsigned)
Cardiology Office Note  Date: 10/12/2022   ID: Jorita, Staton [birth year], MRN [id number]  History of Present Illness: Sara Terry is a 77 y.o. female last seen in July by Ms. Strader PA-C, I reviewed the note.  She is here for a routine visit.  Reports no chest discomfort or sense of palpitations, no specific change in stamina.  She continues to work part-time at [employer] in the evenings.  I reviewed her medications.  She does not report any spontaneous bleeding problems on Eliquis, heart rate well-controlled on Cardizem CD 360 mg daily.  Leg swelling has been less of a concern, she rarely has to take as needed Lasix.  ECG today shows rate controlled atrial fibrillation.  I reviewed her recent lab work as well.  Physical Exam: VS:  BP 136/70   Pulse 94   Ht 5' 5.5" (1.664 m)   Wt 185 lb 12.8 oz (84.3 kg)   SpO2 97%   BMI 30.45 kg/m , BMI Body mass index is 30.45 kg/m.  Wt Readings from Last 3 Encounters:  10/12/22 185 lb 12.8 oz (84.3 kg)  09/06/22 184 lb (83.5 kg)  06/01/22 180 lb 0.6 oz (81.7 kg)    General: Patient appears comfortable at rest. HEENT: Conjunctiva and lids normal. Neck: Supple, no elevated JVP or carotid bruits. Lungs: Clear to auscultation, nonlabored breathing at rest. Cardiac: Irregularly irregular, 2/6 systolic murmur, no gallop. Extremities: No pitting edema.  ECG:  An ECG dated 06/25/2021 was personally reviewed today and demonstrated:  Atrial fibrillation with RVR.  Labwork: 09/07/2022: ALT 16; AST 17; BUN 12; Creatinine, Ser 0.63; Hemoglobin 13.3; Platelets 285; Potassium 4.6; Sodium 140; TSH 1.990     Component Value Date/Time   CHOL 150 09/07/2022 0811   TRIG 48 09/07/2022 0811   HDL 72 09/07/2022 0811   CHOLHDL 2.1 09/07/2022 0811   CHOLHDL 2.2 04/12/2018 0943   VLDL 7 04/12/2018 0943   LDLCALC 67 09/07/2022 0811   Other Studies Reviewed Today:  Echocardiogram 07/09/2021:  1. Left ventricular ejection fraction, by  estimation, is 60 to 65%. The  left ventricle has normal function. The left ventricle has no regional  wall motion abnormalities. There is mild concentric left ventricular  hypertrophy. Left ventricular diastolic  function could not be evaluated.   2. Right ventricular systolic function is normal. The right ventricular  size is mildly enlarged. There is normal pulmonary artery systolic  pressure. The estimated right ventricular systolic pressure is 29.6 mmHg.   3. Left atrial size was mildly dilated.   4. The mitral valve is normal in structure. Trivial mitral valve  regurgitation. No evidence of mitral stenosis.   5. Tricuspid valve regurgitation is mild to moderate.   6. The aortic valve is calcified. There is severe calcifcation of the  aortic valve. There is moderate thickening of the aortic valve. Aortic  valve regurgitation is not visualized. Aortic valve  sclerosis/calcification is present, without any evidence of  aortic stenosis. Aortic valve area, by VTI measures 1.70 cm. Aortic valve  mean gradient measures 7.0 mmHg. Aortic valve Vmax measures 1.83 m/s.   7. The inferior vena cava is normal in size with greater than 50%  respiratory variability, suggesting right atrial pressure of 3 mmHg.   Assessment and Plan:  1.  Permanent atrial fibrillation with CHA2DS2-VASc score of 4-5.  She is doing well without active palpitations, plan to continue Cardizem CD and Eliquis.  I reviewed her recent lab work.  2.  Essential hypertension.  Systolic in the 130s today.  Continue HCTZ and Vasotec with follow-up by PCP.  3.  Mixed hyperlipidemia.  LDL 67 in July.  Doing well on Lipitor.  4.  Intermittent lower extremity edema.  She has as needed Lasix available for use.  No specific change in relatively mild symptoms.  Disposition:  Follow up  1 year, sooner if needed.  Signed, Jonelle Sidle, M.D., F.A.C.C. [facility]

## 2022-10-12 ENCOUNTER — Encounter: Payer: Self-pay | Admitting: Cardiology

## 2022-10-12 ENCOUNTER — Ambulatory Visit: Payer: Medicare HMO | Attending: Cardiology | Admitting: Cardiology

## 2022-10-12 VITALS — BP 136/70 | HR 94 | Ht 65.5 in | Wt 185.8 lb

## 2022-10-12 DIAGNOSIS — I4821 Permanent atrial fibrillation: Secondary | ICD-10-CM

## 2022-10-12 DIAGNOSIS — E782 Mixed hyperlipidemia: Secondary | ICD-10-CM

## 2022-10-12 DIAGNOSIS — I10 Essential (primary) hypertension: Secondary | ICD-10-CM

## 2022-10-12 NOTE — Patient Instructions (Signed)
Medication Instructions:  Your physician recommends that you continue on your current medications as directed. Please refer to the Current Medication list given to you today.   Labwork: None today  Testing/Procedures: None today  Follow-Up: 1 year Dr.McDowell  Any Other Special Instructions Will Be Listed Below (If Applicable).  If you need a refill on your cardiac medications before your next appointment, please call your pharmacy.

## 2022-11-06 ENCOUNTER — Other Ambulatory Visit: Payer: Self-pay | Admitting: Cardiology

## 2022-11-06 DIAGNOSIS — R69 Illness, unspecified: Secondary | ICD-10-CM | POA: Diagnosis not present

## 2022-11-30 ENCOUNTER — Ambulatory Visit (INDEPENDENT_AMBULATORY_CARE_PROVIDER_SITE_OTHER): Payer: Medicare HMO

## 2022-11-30 VITALS — Ht 66.0 in | Wt 180.0 lb

## 2022-11-30 DIAGNOSIS — Z0000 Encounter for general adult medical examination without abnormal findings: Secondary | ICD-10-CM

## 2022-11-30 DIAGNOSIS — Z789 Other specified health status: Secondary | ICD-10-CM

## 2022-11-30 NOTE — Patient Instructions (Signed)
Sara Terry , Thank you for taking time to come for your Medicare Wellness Visit. I appreciate your ongoing commitment to your health goals. Please review the following plan we discussed and let me know if I can assist you in the future.   Referrals/Orders/Follow-Ups/Clinician Recommendations:  Next Medicare Annual Wellness Visit: March 06, 2024 @ 8:40am virtual visit   This is a list of the screening recommended for you and due dates:  Health Maintenance  Topic Date Due   Yearly kidney health urinalysis for diabetes  Never done   Flu Shot  10/06/2022   Mammogram  12/11/2022   Yearly kidney function blood test for diabetes  09/07/2023   Medicare Annual Wellness Visit  11/30/2023   DEXA scan (bone density measurement)  05/14/2024   DTaP/Tdap/Td vaccine (2 - Td or Tdap) 03/22/2031   Pneumonia Vaccine  Completed   Hepatitis C Screening  Completed   Zoster (Shingles) Vaccine  Completed   HPV Vaccine  Aged Out   Colon Cancer Screening  Discontinued   COVID-19 Vaccine  Discontinued    Advanced directives: (Declined) Advance directive discussed with you today. Even though you declined this today, please call our office should you change your mind, and we can give you the proper paperwork for you to fill out.  Next Medicare Annual Wellness Visit scheduled for next year: Yes  Preventive Care 11 Years and Older, Female Preventive care refers to lifestyle choices and visits with your health care provider that can promote health and wellness. Preventive care visits are also called wellness exams. What can I expect for my preventive care visit? Counseling Your health care provider may ask you questions about your: Medical history, including: Past medical problems. Family medical history. Pregnancy and menstrual history. History of falls. Current health, including: Memory and ability to understand (cognition). Emotional well-being. Home life and relationship well-being. Sexual activity  and sexual health. Lifestyle, including: Alcohol, nicotine or tobacco, and drug use. Access to firearms. Diet, exercise, and sleep habits. Work and work Astronomer. Sunscreen use. Safety issues such as seatbelt and bike helmet use. Physical exam Your health care provider will check your: Height and weight. These may be used to calculate your BMI (body mass index). BMI is a measurement that tells if you are at a healthy weight. Waist circumference. This measures the distance around your waistline. This measurement also tells if you are at a healthy weight and may help predict your risk of certain diseases, such as type 2 diabetes and high blood pressure. Heart rate and blood pressure. Body temperature. Skin for abnormal spots. What immunizations do I need?  Vaccines are usually given at various ages, according to a schedule. Your health care provider will recommend vaccines for you based on your age, medical history, and lifestyle or other factors, such as travel or where you work. What tests do I need? Screening Your health care provider may recommend screening tests for certain conditions. This may include: Lipid and cholesterol levels. Hepatitis C test. Hepatitis B test. HIV (human immunodeficiency virus) test. STI (sexually transmitted infection) testing, if you are at risk. Lung cancer screening. Colorectal cancer screening. Diabetes screening. This is done by checking your blood sugar (glucose) after you have not eaten for a while (fasting). Mammogram. Talk with your health care provider about how often you should have regular mammograms. BRCA-related cancer screening. This may be done if you have a family history of breast, ovarian, tubal, or peritoneal cancers. Bone density scan. This is done to  screen for osteoporosis. Talk with your health care provider about your test results, treatment options, and if necessary, the need for more tests. Follow these instructions at  home: Eating and drinking  Eat a diet that includes fresh fruits and vegetables, whole grains, lean protein, and low-fat dairy products. Limit your intake of foods with high amounts of sugar, saturated fats, and salt. Take vitamin and mineral supplements as recommended by your health care provider. Do not drink alcohol if your health care provider tells you not to drink. If you drink alcohol: Limit how much you have to 0-1 drink a day. Know how much alcohol is in your drink. In the [place] one drink equals one 12 oz bottle of beer (355 mL), one 5 oz glass of wine (148 mL), or one 1 oz glass of hard liquor (44 mL). Lifestyle Brush your teeth every morning and night with fluoride toothpaste. Floss one time each day. Exercise for at least 30 minutes 5 or more days each week. Do not use any products that contain nicotine or tobacco. These products include cigarettes, chewing tobacco, and vaping devices, such as e-cigarettes. If you need help quitting, ask your health care provider. Do not use drugs. If you are sexually active, practice safe sex. Use a condom or other form of protection in order to prevent STIs. Take aspirin only as told by your health care provider. Make sure that you understand how much to take and what form to take. Work with your health care provider to find out whether it is safe and beneficial for you to take aspirin daily. Ask your health care provider if you need to take a cholesterol-lowering medicine (statin). Find healthy ways to manage stress, such as: Meditation, yoga, or listening to music. Journaling. Talking to a trusted person. Spending time with friends and family. Minimize exposure to UV radiation to reduce your risk of skin cancer. Safety Always wear your seat belt while driving or riding in a vehicle. Do not drive: If you have been drinking alcohol. Do not ride with someone who has been drinking. When you are tired or distracted. While texting. If you have  been using any mind-altering substances or drugs. Wear a helmet and other protective equipment during sports activities. If you have firearms in your house, make sure you follow all gun safety procedures. What's next? Visit your health care provider once a year for an annual wellness visit. Ask your health care provider how often you should have your eyes and teeth checked. Stay up to date on all vaccines. This information is not intended to replace advice given to you by your health care provider. Make sure you discuss any questions you have with your health care provider. Document Revised: 08/19/2020 Document Reviewed: 08/19/2020 Elsevier Patient Education  2024 [employer]. Understanding Your Risk for Falls Millions of people have serious injuries from falls each year. It is important to understand your risk of falling. Talk with your health care provider about your risk and what you can do to lower it. If you do have a serious fall, make sure to tell your provider. Falling once raises your risk of falling again. How can falls affect me? Serious injuries from falls are common. These include: Broken bones, such as hip fractures. Head injuries, such as traumatic brain injuries (TBI) or concussions. A fear of falling can cause you to avoid activities and stay at home. This can make your muscles weaker and raise your risk for a fall. What can  increase my risk? There are a number of risk factors that increase your risk for falling. The more risk factors you have, the higher your risk of falling. Serious injuries from a fall happen most often to people who are older than 76 years old. Teenagers and young adults ages 22-29 are also at higher risk. Common risk factors include: Weakness in the lower body. Being generally weak or confused due to long-term (chronic) illness. Dizziness or balance problems. Poor vision. Medicines that cause dizziness or drowsiness. These may include: Medicines for  your blood pressure, heart, anxiety, insomnia, or swelling (edema). Pain medicines. Muscle relaxants. Other risk factors include: Drinking alcohol. Having had a fall in the past. Having foot pain or wearing improper footwear. Working at a dangerous job. Having any of the following in your home: Tripping hazards, such as floor clutter or loose rugs. Poor lighting. Pets. Having dementia or memory loss. What actions can I take to lower my risk of falling?     Physical activity Stay physically fit. Do strength and balance exercises. Consider taking a regular class to build strength and balance. Yoga and tai chi are good options. Vision Have your eyes checked every year and your prescription for glasses or contacts updated as needed. Shoes and walking aids Wear non-skid shoes. Wear shoes that have rubber soles and low heels. Do not wear high heels. Do not walk around the house in socks or slippers. Use a cane or walker as told by your provider. Home safety Attach secure railings on both sides of your stairs. Install grab bars for your bathtub, shower, and toilet. Use a non-skid mat in your bathtub or shower. Attach bath mats securely with double-sided, non-slip rug tape. Use good lighting in all rooms. Keep a flashlight near your bed. Make sure there is a clear path from your bed to the bathroom. Use night-lights. Do not use throw rugs. Make sure all carpeting is taped or tacked down securely. Remove all clutter from walkways and stairways, including extension cords. Repair uneven or broken steps and floors. Avoid walking on icy or slippery surfaces. Walk on the grass instead of on icy or slick sidewalks. Use ice melter to get rid of ice on walkways in the winter. Use a cordless phone. Questions to ask your health care provider Can you help me check my risk for a fall? Do any of my medicines make me more likely to fall? Should I take a vitamin D supplement? What exercises can I do  to improve my strength and balance? Should I make an appointment to have my vision checked? Do I need a bone density test to check for weak bones (osteoporosis)? Would it help to use a cane or a walker? Where to find more information Centers for Disease Control and Prevention, STEAD[employer]: TonerPromos.no Community-Based Fall Prevention Programs: TonerPromos.no [employer] on Aging: BaseRingTones.pl Contact a health care provider if: You fall at home. You are afraid of falling at home. You feel weak, drowsy, or dizzy. This information is not intended to replace advice given to you by your health care provider. Make sure you discuss any questions you have with your health care provider. Document Revised: 10/25/2021 Document Reviewed: 10/25/2021 Elsevier Patient Education  2024 [employer].

## 2022-11-30 NOTE — Progress Notes (Signed)
Because this visit was a virtual/telehealth visit,  certain criteria was not obtained, such a blood pressure, CBG if applicable, and timed get up and go. Any medications not marked as "taking" were not mentioned during the medication reconciliation part of the visit. Any vitals not documented were not able to be obtained due to this being a telehealth visit or patient was unable to self-report a recent blood pressure reading due to a lack of equipment at home via telehealth. Vitals that have been documented are verbally provided by the patient.   Subjective:   Sara Sara is a 77 y.o. female who presents for Medicare Annual (Subsequent) preventive examination.  Visit Complete: Virtual  I connected with  Sara Sara on 11/30/22 by a audio enabled telemedicine application and verified that I am speaking with the correct person using two identifiers.  Patient Location: Home  Provider Location: Home Office  I discussed the limitations of evaluation and management by telemedicine. The patient expressed understanding and agreed to proceed.  Patient Medicare AWV questionnaire was completed by the patient on 11/24/2022; I have confirmed that all information answered by patient is correct and no changes since this date.  Cardiac Risk Factors include: dyslipidemia;hypertension;advanced age (>51men, >29 women)     Objective:    Today's Vitals   11/30/22 1330  Weight: 180 lb (81.6 kg)  Height: 5\' 6"  (1.676 m)   Body mass index is 29.05 kg/m.     11/30/2022    1:29 PM 11/01/2021    1:41 PM 06/16/2020    1:11 PM 04/27/2016   12:15 PM 04/21/2016   10:11 AM 11/08/2015    8:04 AM 06/13/2013    8:37 AM  Advanced Directives  Does Patient Have a Medical Advance Directive? No No No No No No Patient does not have advance directive  Would patient like information on creating a medical advance directive? No - Patient declined Yes (ED - Information included in AVS) No - Patient declined No - Patient  declined  No - patient declined information   Pre-existing out of facility DNR order (yellow form or pink MOST form)       No    Current Medications (verified) Outpatient Encounter Medications as of 11/30/2022  Medication Sig   apixaban (ELIQUIS) 5 MG TABS tablet Take 1 tablet (5 mg total) by mouth 2 (two) times daily.   atorvastatin (LIPITOR) 40 MG tablet TAKE 1 TABLET DAILY AT 6PM   calcium carbonate (OS-CAL - DOSED IN MG OF ELEMENTAL CALCIUM) 1250 (500 Ca) MG tablet Take 1 tablet by mouth 2 (two) times daily with a meal.    diltiazem (CARDIZEM CD) 360 MG 24 hr capsule TAKE 1 CAPSULE DAILY (DOSE INCREASE 06/25/2021)   docusate sodium (COLACE) 100 MG capsule Take 100 mg by mouth daily.   enalapril (VASOTEC) 20 MG tablet TAKE 1 TABLET DAILY   furosemide (LASIX) 20 MG tablet Take 1 tablet (20 mg total) by mouth as needed for edema.   hydrochlorothiazide (HYDRODIURIL) 25 MG tablet TAKE 1 TABLET EVERY MORNING   zolpidem (AMBIEN) 10 MG tablet Take 0.5 tablets (5 mg total) by mouth at bedtime as needed for sleep.   No facility-administered encounter medications on file as of 11/30/2022.    Allergies (verified) Maxzide [triamterene-hctz] and Penicillins   History: Past Medical History:  Diagnosis Date   Arthritis    right hip and right knee (had right hip replacement)   Atrial fibrillation (HCC)    Essential hypertension  GERD (gastroesophageal reflux disease)    Glucose intolerance (impaired glucose tolerance)    Insomnia    Raynaud phenomenon    Skin cancer    Forehead   Past Surgical History:  Procedure Laterality Date   ABDOMINOPLASTY     BREAST REDUCTION SURGERY  2003   COLONOSCOPY     COLONOSCOPY N/A 06/13/2013   Procedure: COLONOSCOPY;  Surgeon: Malissa Hippo, MD;  [facility];  Service: Endoscopy;  Laterality: N/A;  930   COSMETIC SURGERY     FOOT SURGERY Left 02/09/2016   JOINT REPLACEMENT     TONSILLECTOMY     TOTAL HIP ARTHROPLASTY Right 04/27/2016    Procedure: RIGHT TOTAL HIP ARTHROPLASTY ANTERIOR APPROACH;  Surgeon: Ollen Gross, MD;  Location: [facility];  Service: Orthopedics;  Laterality: Right;   TUBAL LIGATION     Family History  Problem Relation Age of Onset   Hypertension Mother    Hypertension Father    Diabetes Father    Heart attack Father    Heart disease Father    Hypertension Brother    Social History   Socioeconomic History   Marital status: Married    Spouse name: Not on file   Number of children: 2   Years of education: Not on file   Highest education level: Associate degree: occupational, Scientist, product/process development, or vocational program  Occupational History   Occupation: [employer]- [employer] now    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 [employer]  Tobacco Use   Smoking status: Former    Current packs/day: 0.00    Types: Cigarettes    Start date: 03/07/1964    Quit date: 03/07/1978    Years since quitting: 44.7   Smokeless tobacco: Never  Vaping Use   Vaping status: Never Used  Substance and Sexual Activity   Alcohol use: Yes    Alcohol/week: 21.0 standard drinks of alcohol    Types: 21 Glasses of wine per week    Comment: 2.5 glasses of wine daily   Drug use: No   Sexual activity: Never    Birth control/protection: Surgical, Post-menopausal  Other Topics Concern   Not on file  Social History Narrative   1 child at [employer] and another in [place], [place]   Social Determinants of Health   Financial Resource Strain: Low Risk  (11/24/2022)   Overall Financial Resource Strain (CARDIA)    Difficulty of Paying Living Expenses: Not hard at all  Food Insecurity: No Food Insecurity (11/24/2022)   Hunger Vital Sign    Worried About Running Out of Food in the Last Year: Never true    Ran Out of Food in the Last Year: Never true  Transportation Needs: No Transportation Needs (11/24/2022)   PRAPARE - Administrator, Civil Service (Medical): No    Lack of Transportation (Non-Medical): No  Physical  Activity: Sufficiently Active (11/24/2022)   Exercise Vital Sign    Days of Exercise per Week: 3 days    Minutes of Exercise per Session: 60 min  Stress: No Stress Concern Present (11/24/2022)   [employer] of Occupational Health - Occupational Stress Questionnaire    Feeling of Stress : Not at all  Social Connections: Moderately Isolated (11/24/2022)   Social Connection and Isolation Panel [NHANES]    Frequency of Communication with Friends and Family: Once a week    Frequency of Social Gatherings with Friends and Family: Once a week    Attends Religious Services: More than  4 times per year    Active Member of Clubs or Organizations: No    Attends Banker Meetings: Never    Marital Status: Married    Tobacco Counseling Counseling given: Yes   Clinical Intake:  Pre-visit preparation completed: Yes  Pain : No/denies pain     BMI - recorded: 29.05 Nutritional Status: BMI 25 -29 Overweight Nutritional Risks: None Diabetes: No  How often do you need to have someone help you when you read instructions, pamphlets, or other written materials from your doctor or pharmacy?: 1 - Never  Interpreter Needed?: No  Information entered by :: Abby Jostin Rue, CMA   Activities of Daily Living    11/24/2022    3:45 PM  In your present state of health, do you have any difficulty performing the following activities:  Hearing? 0  Vision? 0  Difficulty concentrating or making decisions? 0  Walking or climbing stairs? 0  Dressing or bathing? 0  Doing errands, shopping? 0  Preparing Food and eating ? N  Using the Toilet? N  In the past six months, have you accidently leaked urine? N  Do you have problems with loss of bowel control? N  Managing your Medications? N  Managing your Finances? N  Housekeeping or managing your Housekeeping? N    Patient Care Team: Gilmore Laroche, FNP as PCP - General (Family Medicine) Jonelle Sidle, MD as PCP - Cardiology  (Cardiology)  Indicate any recent Medical Services you may have received from other than [facility] providers in the past year (date may be approximate).     Assessment:   This is a routine wellness examination for [employer].  Hearing/Vision screen Hearing Screening - Comments:: Patient denies any hearing difficulties.   Vision Screening - Comments:: Patient denies any vision difficulties and wears reading glasses only. UTD w/ yearly eye exams with Dr. Daisy Lazar   Goals Addressed             This Visit's Progress    Patient Stated       Lose some weight        Depression Screen    11/30/2022    1:33 PM 09/06/2022   10:27 AM 06/01/2022   10:02 AM 03/02/2022    8:43 AM 02/09/2022    8:59 AM 11/01/2021    1:41 PM 11/01/2021    1:39 PM  PHQ 2/9 Scores  PHQ - 2 Score 0 0 0 0 0 0 0  PHQ- 9 Score  0 0 0       Fall Risk    11/24/2022    3:45 PM 09/06/2022   10:27 AM 06/01/2022   10:02 AM 03/02/2022    8:43 AM 02/09/2022    8:59 AM  Fall Risk   Falls in the past year? 0 0 0 0 0  Number falls in past yr: 0 0 0 0 0  Injury with Fall? 0 0 0 0 0  Risk for fall due to : No Fall Risks No Fall Risks No Fall Risks No Fall Risks   Follow up Falls prevention discussed Falls evaluation completed Falls evaluation completed Falls evaluation completed     MEDICARE RISK AT HOME: Medicare Risk at Home Any stairs in or around the home?: Yes If so, are there any without handrails?: No Home free of loose throw rugs in walkways, pet beds, electrical cords, etc?: Yes Adequate lighting in your home to reduce risk of falls?: Yes Life alert?: No Use of a cane,  walker or w/c?: No Grab bars in the bathroom?: No Shower chair or bench in shower?: No Elevated toilet seat or a handicapped toilet?: Yes  TIMED UP AND GO:  Was the test performed?  No    Cognitive Function:    11/01/2021    1:42 PM  MMSE - Mini Mental State Exam  Not completed: Unable to complete        11/30/2022    1:32 PM  11/01/2021    1:42 PM  6CIT Screen  What Year? 0 points 0 points  What month? 0 points 0 points  What time? 0 points 0 points  Count back from 20 0 points 0 points  Months in reverse 0 points 0 points  Repeat phrase 0 points 0 points  Total Score 0 points 0 points    Immunizations Immunization History  Administered Date(s) Administered   Fluad Quad(high Dose 65+) 11/22/2020, 11/24/2021   Influenza,inj,Quad PF,6+ Mos 01/05/2015   Influenza-Unspecified 12/17/2013, 01/04/2016, 04/30/2017, 01/21/2018, 01/12/2019, 12/17/2019   Moderna Sars-Covid-2 Vaccination 04/20/2019, 05/19/2019, 01/11/2020   Pneumococcal Conjugate-13 06/12/2019   Pneumococcal Polysaccharide-23 12/26/2013   Tdap 03/21/2021   Zoster Recombinant(Shingrix) 07/29/2019, 11/08/2019   Zoster, Live 04/24/2009    TDAP status: Up to date  Flu Vaccine status: Due, Education has been provided regarding the importance of this vaccine. Advised may receive this vaccine at local pharmacy or Health Dept. Aware to provide a copy of the vaccination record if obtained from local pharmacy or Health Dept. Verbalized acceptance and understanding.  Pneumococcal vaccine status: Up to date  Covid-19 vaccine status: Completed vaccines  Qualifies for Shingles Vaccine? No   Zostavax completed Yes   Shingrix Completed?: Yes  Screening Tests Health Maintenance  Topic Date Due   Diabetic kidney evaluation - Urine ACR  Never done   INFLUENZA VACCINE  10/06/2022   Medicare Annual Wellness (AWV)  11/02/2022   MAMMOGRAM  12/11/2022   Diabetic kidney evaluation - eGFR measurement  09/07/2023   DTaP/Tdap/Td (2 - Td or Tdap) 03/22/2031   Pneumonia Vaccine 47+ Years old  Completed   DEXA SCAN  Completed   Hepatitis C Screening  Completed   Zoster Vaccines- Shingrix  Completed   HPV VACCINES  Aged Out   Colonoscopy  Discontinued   COVID-19 Vaccine  Discontinued    Health Maintenance  Health Maintenance Due  Topic Date Due   Diabetic  kidney evaluation - Urine ACR  Never done   INFLUENZA VACCINE  10/06/2022   Medicare Annual Wellness (AWV)  11/02/2022    Colorectal cancer screening: No longer required.   Mammogram Status: Scheduled for  December 23, 2022 at [facility]  Bone Density status: Completed 05/15/2019. Results reflect: Bone density results: NORMAL. Repeat every 5 years.  Lung Cancer Screening: (Low Dose CT Chest recommended if Age 81-80 years, 20 pack-year currently smoking OR have quit w/in 15years.) does not qualify.   Additional Screening:  Hepatitis C Screening: does not qualify; Completed 03/09/2021  Vision Screening: Recommended annual ophthalmology exams for early detection of glaucoma and other disorders of the eye. Is the patient up to date with their annual eye exam?  Yes  Who is the provider or what is the name of the office in which the patient attends annual eye exams? Daisy Lazar @ [facility] If pt is not established with a provider, would they like to be referred to a provider to establish care? No .   Dental Screening: Recommended annual dental exams for proper oral  hygiene  Diabetic Foot Exam: n/a  Community Resource Referral / Chronic Care Management: CRR required this visit?  No   CCM required this visit?  No     Plan:     I have personally reviewed and noted the following in the patient's chart:   Medical and social history Use of alcohol, tobacco or illicit drugs  Current medications and supplements including opioid prescriptions. Patient is not currently taking opioid prescriptions. Functional ability and status Nutritional status Physical activity Advanced directives List of other physicians Hospitalizations, surgeries, and ER visits in previous 12 months Vitals Screenings to include cognitive, depression, and falls Referrals and appointments  In addition, I have reviewed and discussed with patient certain preventive protocols, quality metrics, and best  practice recommendations. A written personalized care plan for preventive services as well as general preventive health recommendations were provided to patient.     Jordan Hawks Argil Mahl, CMA   11/30/2022   After Visit Summary: (MyChart) Due to this being a telephonic visit, the after visit summary with patients personalized plan was offered to patient via MyChart   Nurse Notes:

## 2022-12-06 DIAGNOSIS — R69 Illness, unspecified: Secondary | ICD-10-CM | POA: Diagnosis not present

## 2022-12-07 ENCOUNTER — Encounter: Payer: Self-pay | Admitting: Family Medicine

## 2022-12-07 ENCOUNTER — Ambulatory Visit (INDEPENDENT_AMBULATORY_CARE_PROVIDER_SITE_OTHER): Payer: Medicare HMO | Admitting: Family Medicine

## 2022-12-07 VITALS — BP 138/82 | HR 85 | Ht 65.5 in | Wt 185.0 lb

## 2022-12-07 DIAGNOSIS — R7303 Prediabetes: Secondary | ICD-10-CM

## 2022-12-07 DIAGNOSIS — E559 Vitamin D deficiency, unspecified: Secondary | ICD-10-CM

## 2022-12-07 DIAGNOSIS — I10 Essential (primary) hypertension: Secondary | ICD-10-CM | POA: Diagnosis not present

## 2022-12-07 DIAGNOSIS — E038 Other specified hypothyroidism: Secondary | ICD-10-CM

## 2022-12-07 DIAGNOSIS — R7301 Impaired fasting glucose: Secondary | ICD-10-CM

## 2022-12-07 DIAGNOSIS — E7849 Other hyperlipidemia: Secondary | ICD-10-CM

## 2022-12-07 NOTE — Progress Notes (Signed)
Established Patient Office Visit  Subjective:  Patient ID: Sara Terry, female    DOB: Jul 17, 1945  Age: 77 y.o. MRN: [id number]  CC:  Chief Complaint  Patient presents with   Care Management    3 month f/u    HPI Sara Terry is a 77 y.o. female with past medical history of hypertension, hyperlipidemia and prediabetes presents for f/u of  chronic medical conditions. For the details of today's visit, please refer to the assessment and plan.      Past Medical History:  Diagnosis Date   Arthritis    right hip and right knee (had right hip replacement)   Atrial fibrillation (HCC)    Essential hypertension    GERD (gastroesophageal reflux disease)    Glucose intolerance (impaired glucose tolerance)    Insomnia    Raynaud phenomenon    Skin cancer    Forehead    Past Surgical History:  Procedure Laterality Date   ABDOMINOPLASTY     BREAST REDUCTION SURGERY  2003   COLONOSCOPY     COLONOSCOPY N/A 06/13/2013   Procedure: COLONOSCOPY;  Surgeon: Malissa Hippo, MD;  [facility];  Service: Endoscopy;  Laterality: N/A;  930   COSMETIC SURGERY     FOOT SURGERY Left 02/09/2016   JOINT REPLACEMENT     TONSILLECTOMY     TOTAL HIP ARTHROPLASTY Right 04/27/2016   Procedure: RIGHT TOTAL HIP ARTHROPLASTY ANTERIOR APPROACH;  Surgeon: Ollen Gross, MD;  Location: [facility];  Service: Orthopedics;  Laterality: Right;   TUBAL LIGATION      Family History  Problem Relation Age of Onset   Hypertension Mother    Hypertension Father    Diabetes Father    Heart attack Father    Heart disease Father    Hypertension Brother     Social History   Socioeconomic History   Marital status: Married    Spouse name: Not on file   Number of children: 2   Years of education: Not on file   Highest education level: Associate degree: occupational, Scientist, product/process development, or vocational program  Occupational History   Occupation: [employer]- [employer] now    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 [employer]  Tobacco Use   Smoking status: Former    Current packs/day: 0.00    Types: Cigarettes    Start date: 03/07/1964    Quit date: 03/07/1978    Years since quitting: 44.7   Smokeless tobacco: Never  Vaping Use   Vaping status: Never Used  Substance and Sexual Activity   Alcohol use: Yes    Alcohol/week: 21.0 standard drinks of alcohol    Types: 21 Glasses of wine per week    Comment: 2.5 glasses of wine daily   Drug use: No   Sexual activity: Never    Birth control/protection: Surgical, Post-menopausal  Other Topics Concern   Not on file  Social History Narrative   1 child at [employer] and another in [place], [place]   Social Determinants of Health   Financial Resource Strain: Low Risk  (11/24/2022)   Overall Financial Resource Strain (CARDIA)    Difficulty of Paying Living Expenses: Not hard at all  Food Insecurity: No Food Insecurity (11/24/2022)   Hunger Vital Sign    Worried About Running Out of Food in the Last Year: Never true    Ran Out of Food in the Last Year: Never true  Transportation Needs: No Transportation Needs (11/24/2022)  PRAPARE - Administrator, Civil Service (Medical): No    Lack of Transportation (Non-Medical): No  Physical Activity: Sufficiently Active (11/24/2022)   Exercise Vital Sign    Days of Exercise per Week: 3 days    Minutes of Exercise per Session: 60 min  Stress: No Stress Concern Present (11/24/2022)   [employer] of Occupational Health - Occupational Stress Questionnaire    Feeling of Stress : Not at all  Social Connections: Moderately Isolated (11/24/2022)   Social Connection and Isolation Panel [NHANES]    Frequency of Communication with Friends and Family: Once a week    Frequency of Social Gatherings with Friends and Family: Once a week    Attends Religious Services: More than 4 times per year    Active Member of [employer] or Organizations: No    Attends Banker Meetings: Never     Marital Status: Married  Catering manager Violence: Not At Risk (11/30/2022)   Humiliation, Afraid, Rape, and Kick questionnaire    Fear of Current or Ex-Partner: No    Emotionally Abused: No    Physically Abused: No    Sexually Abused: No    Outpatient Medications Prior to Visit  Medication Sig Dispense Refill   apixaban (ELIQUIS) 5 MG TABS tablet Take 1 tablet (5 mg total) by mouth 2 (two) times daily. 180 tablet 1   atorvastatin (LIPITOR) 40 MG tablet TAKE 1 TABLET DAILY AT 6PM 90 tablet 3   calcium carbonate (OS-CAL - DOSED IN MG OF ELEMENTAL CALCIUM) 1250 (500 Ca) MG tablet Take 1 tablet by mouth 2 (two) times daily with a meal.      diltiazem (CARDIZEM CD) 360 MG 24 hr capsule TAKE 1 CAPSULE DAILY (DOSE INCREASE 06/25/2021) 90 capsule 1   docusate sodium (COLACE) 100 MG capsule Take 100 mg by mouth daily.     enalapril (VASOTEC) 20 MG tablet TAKE 1 TABLET DAILY 90 tablet 3   furosemide (LASIX) 20 MG tablet Take 1 tablet (20 mg total) by mouth as needed for edema. 30 tablet 11   hydrochlorothiazide (HYDRODIURIL) 25 MG tablet TAKE 1 TABLET EVERY MORNING 90 tablet 3   zolpidem (AMBIEN) 10 MG tablet Take 0.5 tablets (5 mg total) by mouth at bedtime as needed for sleep. 30 tablet 0   No facility-administered medications prior to visit.    Allergies  Allergen Reactions   Maxzide [Triamterene-Hctz]    Penicillins Hives    Has patient had a PCN reaction causing immediate rash, facial/tongue/throat swelling, SOB or lightheadedness with hypotension: Yes Has patient had a PCN reaction causing severe rash involving mucus membranes or skin necrosis: No Has patient had a PCN reaction that required hospitalization No Has patient had a PCN reaction occurring within the last 10 years: No If all of the above answers are "NO", then may proceed with Cephalosporin use.     ROS Review of Systems  Constitutional:  Negative for chills and fever.  Eyes:  Negative for visual disturbance.   Respiratory:  Negative for chest tightness and shortness of breath.   Neurological:  Negative for dizziness and headaches.      Objective:    Physical Exam HENT:     Head: Normocephalic.     Mouth/Throat:     Mouth: Mucous membranes are moist.  Cardiovascular:     Rate and Rhythm: Normal rate.     Heart sounds: Normal heart sounds.  Pulmonary:     Effort: Pulmonary effort is normal.  Breath sounds: Normal breath sounds.  Neurological:     Mental Status: She is alert.     BP 138/82   Pulse 85   Ht 5' 5.5" (1.664 m)   Wt 185 lb (83.9 kg)   SpO2 98%   BMI 30.32 kg/m  Wt Readings from Last 3 Encounters:  12/07/22 185 lb (83.9 kg)  11/30/22 180 lb (81.6 kg)  10/12/22 185 lb 12.8 oz (84.3 kg)    Lab Results  Component Value Date   TSH 1.990 09/07/2022   Lab Results  Component Value Date   WBC 5.6 09/07/2022   HGB 13.3 09/07/2022   HCT 37.6 09/07/2022   MCV 95 09/07/2022   PLT 285 09/07/2022   Lab Results  Component Value Date   NA 140 09/07/2022   K 4.6 09/07/2022   CO2 24 09/07/2022   GLUCOSE 110 (H) 09/07/2022   BUN 12 09/07/2022   CREATININE 0.63 09/07/2022   BILITOT 0.4 09/07/2022   ALKPHOS 77 09/07/2022   AST 17 09/07/2022   ALT 16 09/07/2022   PROT 6.0 09/07/2022   ALBUMIN 4.1 09/07/2022   CALCIUM 9.1 09/07/2022   ANIONGAP 8 04/12/2018   EGFR 92 09/07/2022   Lab Results  Component Value Date   CHOL 150 09/07/2022   Lab Results  Component Value Date   HDL 72 09/07/2022   Lab Results  Component Value Date   LDLCALC 67 09/07/2022   Lab Results  Component Value Date   TRIG 48 09/07/2022   Lab Results  Component Value Date   CHOLHDL 2.1 09/07/2022   Lab Results  Component Value Date   HGBA1C 6.0 (H) 09/07/2022      Assessment & Plan:  Essential hypertension, benign Assessment & Plan: Controlled She takes in the enalapril 20 mg daily, furosemide 20 mg as needed, hydrochlorothiazide 25 mg daily and diltiazem 360 mg  daily She denies headaches, dizziness, blurred vision Encouraged low-sodium diet with increased physical activity BP Readings from Last 3 Encounters:  12/07/22 138/82  10/12/22 136/70  09/06/22 138/89      Other hyperlipidemia Assessment & Plan: She takes atorvastatin 40 mg daily Denies muscle aches and pain Reports compliant with treatment regimen Recommended decreasing her intake of greasy, starchy, and fatty foods with increased physical activity Pending lipid panel Lab Results  Component Value Date   CHOL 150 09/07/2022   HDL 72 09/07/2022   LDLCALC 67 09/07/2022   TRIG 48 09/07/2022   CHOLHDL 2.1 09/07/2022     Orders: -     Lipid panel -     CMP14+EGFR -     CBC with Differential/Platelet  Prediabetes Assessment & Plan: For managing prediabetes, I recommend the following lifestyle changes:  Reduce Intake of High-Sugar Foods and Beverages: Limit foods and drinks high in sugar to help regulate blood sugar levels. Increase Consumption of Nutrient-Rich Foods: Focus on incorporating more fruits, vegetables, and whole grains into your diet. Choose Lean Proteins: Opt for lean proteins such as chicken, fish, beans, and legumes. Select Low-Fat Dairy Products: Choose low-fat or non-fat dairy options. Minimize Saturated Fats, Trans Fats, and Cholesterol: Reduce intake of foods high in saturated fats, trans fatty acids, and cholesterol. Engage in Regular Physical Activity: Aim for at least 30 minutes of brisk walking or other moderate activity at least 5 days a week.    Other specified hypothyroidism -     TSH + free T4  Vitamin D deficiency -     VITAMIN D 25  Hydroxy (Vit-D Deficiency, Fractures)  IFG (impaired fasting glucose) -     Microalbumin / creatinine urine ratio -     Hemoglobin A1c  Note: This chart has been completed using Engineer, civil (consulting) software, and while attempts have been made to ensure accuracy, certain words and phrases may not be transcribed  as intended.    Follow-up: Return in about 4 months (around 04/09/2023).   Gilmore Laroche, FNP

## 2022-12-07 NOTE — Assessment & Plan Note (Signed)
Controlled She takes in the enalapril 20 mg daily, furosemide 20 mg as needed, hydrochlorothiazide 25 mg daily and diltiazem 360 mg daily She denies headaches, dizziness, blurred vision Encouraged low-sodium diet with increased physical activity BP Readings from Last 3 Encounters:  12/07/22 138/82  10/12/22 136/70  09/06/22 138/89

## 2022-12-07 NOTE — Assessment & Plan Note (Signed)
She takes atorvastatin 40 mg daily Denies muscle aches and pain Reports compliant with treatment regimen Recommended decreasing her intake of greasy, starchy, and fatty foods with increased physical activity Pending lipid panel Lab Results  Component Value Date   CHOL 150 09/07/2022   HDL 72 09/07/2022   LDLCALC 67 09/07/2022   TRIG 48 09/07/2022   CHOLHDL 2.1 09/07/2022

## 2022-12-07 NOTE — Assessment & Plan Note (Signed)
For managing prediabetes, I recommend the following lifestyle changes:  Reduce Intake of High-Sugar Foods and Beverages: Limit foods and drinks high in sugar to help regulate blood sugar levels. Increase Consumption of Nutrient-Rich Foods: Focus on incorporating more fruits, vegetables, and whole grains into your diet. Choose Lean Proteins: Opt for lean proteins such as chicken, fish, beans, and legumes. Select Low-Fat Dairy Products: Choose low-fat or non-fat dairy options. Minimize Saturated Fats, Trans Fats, and Cholesterol: Reduce intake of foods high in saturated fats, trans fatty acids, and cholesterol. Engage in Regular Physical Activity: Aim for at least 30 minutes of brisk walking or other moderate activity at least 5 days a week.

## 2022-12-07 NOTE — Patient Instructions (Signed)
I appreciate the opportunity to provide care to you today!    Follow up:  4 months  Labs: please stop by the lab today to get your blood drawn (CBC, CMP, TSH, Lipid profile, HgA1c, Vit D)   Attached with your AVS, you will find valuable resources for self-education. I highly recommend dedicating some time to thoroughly examine them.   Please continue to a heart-healthy diet and increase your physical activities. Try to exercise for 30mins at least five days a week.    It was a pleasure to see you and I look forward to continuing to work together on your health and well-being. Please do not hesitate to call the office if you need care or have questions about your care.  In case of emergency, please visit the Emergency Department for urgent care, or contact our clinic at [phone] to schedule an appointment. We're here to help you!   Have a wonderful day and week. With Gratitude, Kylan Veach MSN, FNP-BC  

## 2022-12-08 LAB — CMP14+EGFR
ALT: 16 [IU]/L (ref 0–32)
AST: 20 [IU]/L (ref 0–40)
Albumin: 4.2 g/dL (ref 3.8–4.8)
Alkaline Phosphatase: 73 [IU]/L (ref 44–121)
BUN/Creatinine Ratio: 20 (ref 12–28)
BUN: 13 mg/dL (ref 8–27)
Bilirubin Total: 0.5 mg/dL (ref 0.0–1.2)
CO2: 26 mmol/L (ref 20–29)
Calcium: 9.7 mg/dL (ref 8.7–10.3)
Chloride: 95 mmol/L — ABNORMAL LOW (ref 96–106)
Creatinine, Ser: 0.66 mg/dL (ref 0.57–1.00)
Globulin, Total: 1.8 g/dL (ref 1.5–4.5)
Glucose: 107 mg/dL — ABNORMAL HIGH (ref 70–99)
Potassium: 4.5 mmol/L (ref 3.5–5.2)
Sodium: 136 mmol/L (ref 134–144)
Total Protein: 6 g/dL (ref 6.0–8.5)
eGFR: 91 mL/min/{1.73_m2} (ref >59)

## 2022-12-08 LAB — CBC WITH DIFFERENTIAL/PLATELET
Basophils Absolute: 0.1 10*3/uL (ref 0.0–0.2)
Basos: 1 %
EOS (ABSOLUTE): 0.1 10*3/uL (ref 0.0–0.4)
Eos: 2 %
Hematocrit: 41.3 % (ref 34.0–46.6)
Hemoglobin: 13.6 g/dL (ref 11.1–15.9)
Immature Grans (Abs): 0 10*3/uL (ref 0.0–0.1)
Immature Granulocytes: 0 %
Lymphocytes Absolute: 1.7 10*3/uL (ref 0.7–3.1)
Lymphs: 32 %
MCH: 32.6 pg (ref 26.6–33.0)
MCHC: 32.9 g/dL (ref 31.5–35.7)
MCV: 99 fL — ABNORMAL HIGH (ref 79–97)
Monocytes Absolute: 0.5 10*3/uL (ref 0.1–0.9)
Monocytes: 10 %
Neutrophils Absolute: 2.9 10*3/uL (ref 1.4–7.0)
Neutrophils: 55 %
Platelets: 306 10*3/uL (ref 150–450)
RBC: 4.17 x10E6/uL (ref 3.77–5.28)
RDW: 11.4 % — ABNORMAL LOW (ref 11.7–15.4)
WBC: 5.2 10*3/uL (ref 3.4–10.8)

## 2022-12-08 LAB — HEMOGLOBIN A1C
Est. average glucose Bld gHb Est-mCnc: 123 mg/dL
Hgb A1c MFr Bld: 5.9 % — ABNORMAL HIGH (ref 4.8–5.6)

## 2022-12-08 LAB — LIPID PANEL
Chol/HDL Ratio: 2 {ratio} (ref 0.0–4.4)
Cholesterol, Total: 149 mg/dL (ref 100–199)
HDL: 74 mg/dL (ref >39)
LDL Chol Calc (NIH): 66 mg/dL (ref 0–99)
Triglycerides: 40 mg/dL (ref 0–149)
VLDL Cholesterol Cal: 9 mg/dL (ref 5–40)

## 2022-12-08 LAB — TSH+FREE T4
Free T4: 1.46 ng/dL (ref 0.82–1.77)
TSH: 1.83 u[IU]/mL (ref 0.450–4.500)

## 2022-12-08 LAB — VITAMIN D 25 HYDROXY (VIT D DEFICIENCY, FRACTURES): Vit D, 25-Hydroxy: 52.8 ng/mL (ref 30.0–100.0)

## 2022-12-23 DIAGNOSIS — Z1231 Encounter for screening mammogram for malignant neoplasm of breast: Secondary | ICD-10-CM | POA: Diagnosis not present

## 2022-12-23 LAB — HM MAMMOGRAPHY

## 2023-01-06 DIAGNOSIS — R69 Illness, unspecified: Secondary | ICD-10-CM | POA: Diagnosis not present

## 2023-01-09 ENCOUNTER — Encounter: Payer: Self-pay | Admitting: Family Medicine

## 2023-01-28 DIAGNOSIS — R69 Illness, unspecified: Secondary | ICD-10-CM | POA: Diagnosis not present

## 2023-02-05 DIAGNOSIS — R69 Illness, unspecified: Secondary | ICD-10-CM | POA: Diagnosis not present

## 2023-04-12 ENCOUNTER — Ambulatory Visit: Payer: Medicare HMO | Admitting: Family Medicine

## 2023-04-26 ENCOUNTER — Other Ambulatory Visit: Payer: Self-pay | Admitting: *Deleted

## 2023-04-26 ENCOUNTER — Encounter: Payer: Self-pay | Admitting: Cardiology

## 2023-04-26 DIAGNOSIS — I4821 Permanent atrial fibrillation: Secondary | ICD-10-CM

## 2023-04-26 MED ORDER — APIXABAN 5 MG PO TABS: 5.0000 mg | ORAL_TABLET | Freq: Two times a day (BID) | ORAL | 1 refills | Status: DC

## 2023-04-26 MED ORDER — APIXABAN 5 MG PO TABS
5.0000 mg | ORAL_TABLET | Freq: Two times a day (BID) | ORAL | 1 refills | Status: DC
Start: 2023-04-26 — End: 2023-04-26

## 2023-04-26 NOTE — Telephone Encounter (Signed)
Prescription refill request for Eliquis received. Indication: Afib  Last office visit: 10/12/22 Diona Browner)  Scr: 0.66 (12/07/22)  Age: 78 Weight: 83.9kg  Appropriate dose. Refill sent.

## 2023-04-26 NOTE — Addendum Note (Signed)
Addended by: Betsy Coder B on: 04/26/2023 10:40 AM   Modules accepted: Orders

## 2023-05-02 ENCOUNTER — Ambulatory Visit: Payer: Medicare HMO | Admitting: Family Medicine

## 2023-05-04 ENCOUNTER — Encounter: Payer: Self-pay | Admitting: Family Medicine

## 2023-05-04 ENCOUNTER — Ambulatory Visit (INDEPENDENT_AMBULATORY_CARE_PROVIDER_SITE_OTHER): Payer: Medicare HMO | Admitting: Family Medicine

## 2023-05-04 VITALS — BP 125/84 | HR 89 | Ht 65.0 in | Wt 187.0 lb

## 2023-05-04 DIAGNOSIS — R7301 Impaired fasting glucose: Secondary | ICD-10-CM | POA: Diagnosis not present

## 2023-05-04 DIAGNOSIS — R7303 Prediabetes: Secondary | ICD-10-CM | POA: Diagnosis not present

## 2023-05-04 DIAGNOSIS — E785 Hyperlipidemia, unspecified: Secondary | ICD-10-CM

## 2023-05-04 DIAGNOSIS — E038 Other specified hypothyroidism: Secondary | ICD-10-CM | POA: Diagnosis not present

## 2023-05-04 DIAGNOSIS — E7849 Other hyperlipidemia: Secondary | ICD-10-CM | POA: Diagnosis not present

## 2023-05-04 DIAGNOSIS — E559 Vitamin D deficiency, unspecified: Secondary | ICD-10-CM | POA: Diagnosis not present

## 2023-05-04 DIAGNOSIS — I10 Essential (primary) hypertension: Secondary | ICD-10-CM

## 2023-05-04 NOTE — Progress Notes (Signed)
 Established Patient Office Visit  Subjective:  Patient ID: Sara Terry, female    DOB: [birth year]  Age: 78 y.o. MRN: [id number]  CC:  Chief Complaint  Patient presents with   Hypertension    Four month follow up    HPI Sara Terry is a 78 y.o. female with past medical history of HTN, and HLP presents for f/u of  chronic medical conditions. For the details of today's visit, please refer to the assessment and plan.     Past Medical History:  Diagnosis Date   Arthritis    right hip and right knee (had right hip replacement)   Atrial fibrillation (HCC)    Essential hypertension    GERD (gastroesophageal reflux disease)    Glucose intolerance (impaired glucose tolerance)    Insomnia    Raynaud phenomenon    Skin cancer    Forehead    Past Surgical History:  Procedure Laterality Date   ABDOMINOPLASTY     BREAST REDUCTION SURGERY  2003   COLONOSCOPY     COLONOSCOPY N/A 06/13/2013   Procedure: COLONOSCOPY;  Surgeon: Malissa Hippo, MD;  Location: [facility];  Service: Endoscopy;  Laterality: N/A;  930   COSMETIC SURGERY     FOOT SURGERY Left 02/09/2016   JOINT REPLACEMENT     TONSILLECTOMY     TOTAL HIP ARTHROPLASTY Right 04/27/2016   Procedure: RIGHT TOTAL HIP ARTHROPLASTY ANTERIOR APPROACH;  Surgeon: Ollen Gross, MD;  Location: [facility];  Service: Orthopedics;  Laterality: Right;   TUBAL LIGATION      Family History  Problem Relation Age of Onset   Hypertension Mother    Hypertension Father    Diabetes Father    Heart attack Father    Heart disease Father    Hypertension Brother     Social History   Socioeconomic History   Marital status: Married    Spouse name: Not on file   Number of children: 2   Years of education: Not on file   Highest education level: Associate degree: occupational, Scientist, product/process development, or vocational program  Occupational History   Occupation: [employer] now    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 [employer]   Tobacco Use   Smoking status: Former    Current packs/day: 0.00    Types: Cigarettes    Start date: 03/07/1964    Quit date: 03/07/1978    Years since quitting: 45.1   Smokeless tobacco: Never  Vaping Use   Vaping status: Never Used  Substance and Sexual Activity   Alcohol use: Yes    Alcohol/week: 21.0 standard drinks of alcohol    Types: 21 Glasses of wine per week    Comment: 2.5 glasses of wine daily   Drug use: No   Sexual activity: Never    Birth control/protection: Surgical, Post-menopausal  Other Topics Concern   Not on file  Social History Narrative   1 child at [employer] and another in [place], [place]   Social Drivers of Health   Financial Resource Strain: Low Risk  (11/24/2022)   Overall Financial Resource Strain (CARDIA)    Difficulty of Paying Living Expenses: Not hard at all  Food Insecurity: No Food Insecurity (11/24/2022)   Hunger Vital Sign    Worried About Running Out of Food in the Last Year: Never true    Ran Out of Food in the Last Year: Never true  Transportation Needs: No Transportation Needs (11/24/2022)   PRAPARE -  Administrator, Civil Service (Medical): No    Lack of Transportation (Non-Medical): No  Physical Activity: Sufficiently Active (11/24/2022)   Exercise Vital Sign    Days of Exercise per Week: 3 days    Minutes of Exercise per Session: 60 min  Stress: No Stress Concern Present (11/24/2022)   [employer] of Occupational Health - Occupational Stress Questionnaire    Feeling of Stress : Not at all  Social Connections: Moderately Isolated (11/24/2022)   Social Connection and Isolation Panel [NHANES]    Frequency of Communication with Friends and Family: Once a week    Frequency of Social Gatherings with Friends and Family: Once a week    Attends Religious Services: More than 4 times per year    Active Member of [employer] or Organizations: No    Attends Banker Meetings: Never    Marital Status: Married  Careers information officer Violence: Not At Risk (11/30/2022)   Humiliation, Afraid, Rape, and Kick questionnaire    Fear of Current or Ex-Partner: No    Emotionally Abused: No    Physically Abused: No    Sexually Abused: No    Outpatient Medications Prior to Visit  Medication Sig Dispense Refill   apixaban (ELIQUIS) 5 MG TABS tablet Take 1 tablet (5 mg total) by mouth 2 (two) times daily. 180 tablet 1   atorvastatin (LIPITOR) 40 MG tablet TAKE 1 TABLET DAILY AT 6PM 90 tablet 3   calcium carbonate (OS-CAL - DOSED IN MG OF ELEMENTAL CALCIUM) 1250 (500 Ca) MG tablet Take 1 tablet by mouth 2 (two) times daily with a meal.      diltiazem (CARDIZEM CD) 360 MG 24 hr capsule TAKE 1 CAPSULE DAILY (DOSE INCREASE 06/25/2021) 90 capsule 1   docusate sodium (COLACE) 100 MG capsule Take 100 mg by mouth daily.     enalapril (VASOTEC) 20 MG tablet TAKE 1 TABLET DAILY 90 tablet 3   furosemide (LASIX) 20 MG tablet Take 1 tablet (20 mg total) by mouth as needed for edema. 30 tablet 11   hydrochlorothiazide (HYDRODIURIL) 25 MG tablet TAKE 1 TABLET EVERY MORNING 90 tablet 3   zolpidem (AMBIEN) 10 MG tablet Take 0.5 tablets (5 mg total) by mouth at bedtime as needed for sleep. 30 tablet 0   No facility-administered medications prior to visit.    Allergies  Allergen Reactions   Maxzide [Triamterene-Hctz]    Penicillins Hives    Has patient had a PCN reaction causing immediate rash, facial/tongue/throat swelling, SOB or lightheadedness with hypotension: Yes Has patient had a PCN reaction causing severe rash involving mucus membranes or skin necrosis: No Has patient had a PCN reaction that required hospitalization No Has patient had a PCN reaction occurring within the last 10 years: No If all of the above answers are "NO", then may proceed with Cephalosporin use.     ROS Review of Systems  Constitutional:  Negative for chills and fever.  Eyes:  Negative for visual disturbance.  Respiratory:  Negative for chest tightness  and shortness of breath.   Neurological:  Negative for dizziness and headaches.      Objective:    Physical Exam HENT:     Head: Normocephalic.     Mouth/Throat:     Mouth: Mucous membranes are moist.  Cardiovascular:     Rate and Rhythm: Normal rate.     Heart sounds: Normal heart sounds.  Pulmonary:     Effort: Pulmonary effort is normal.  Breath sounds: Normal breath sounds.  Neurological:     Mental Status: She is alert.     BP 125/84 (BP Location: Left Arm, Patient Position: Sitting, Cuff Size: Normal)   Pulse 89   Ht 5\' 5"  (1.651 m)   Wt 187 lb (84.8 kg)   SpO2 98%   BMI 31.12 kg/m  Wt Readings from Last 3 Encounters:  05/04/23 187 lb (84.8 kg)  12/07/22 185 lb (83.9 kg)  11/30/22 180 lb (81.6 kg)    Lab Results  Component Value Date   TSH 1.830 12/07/2022   Lab Results  Component Value Date   WBC 5.2 12/07/2022   HGB 13.6 12/07/2022   HCT 41.3 12/07/2022   MCV 99 (H) 12/07/2022   PLT 306 12/07/2022   Lab Results  Component Value Date   NA 136 12/07/2022   K 4.5 12/07/2022   CO2 26 12/07/2022   GLUCOSE 107 (H) 12/07/2022   BUN 13 12/07/2022   CREATININE 0.66 12/07/2022   BILITOT 0.5 12/07/2022   ALKPHOS 73 12/07/2022   AST 20 12/07/2022   ALT 16 12/07/2022   PROT 6.0 12/07/2022   ALBUMIN 4.2 12/07/2022   CALCIUM 9.7 12/07/2022   ANIONGAP 8 04/12/2018   EGFR 91 12/07/2022   Lab Results  Component Value Date   CHOL 149 12/07/2022   Lab Results  Component Value Date   HDL 74 12/07/2022   Lab Results  Component Value Date   LDLCALC 66 12/07/2022   Lab Results  Component Value Date   TRIG 40 12/07/2022   Lab Results  Component Value Date   CHOLHDL 2.0 12/07/2022   Lab Results  Component Value Date   HGBA1C 5.9 (H) 12/07/2022      Assessment & Plan:  Essential hypertension, benign Assessment & Plan: Controlled She takes in the enalapril 20 mg daily, furosemide 20 mg as needed, hydrochlorothiazide 25 mg daily and  diltiazem 360 mg daily She denies headaches, dizziness, blurred vision Encouraged low-sodium diet with increased physical activity BP Readings from Last 3 Encounters:  05/04/23 125/84  12/07/22 138/82  10/12/22 136/70      Hyperlipidemia LDL goal <55 Assessment & Plan: The patient was encouraged to continue taking Lipitor 40 mg daily for cholesterol management. Lifestyle modifications were also discussed, including avoiding simple carbohydrates such as cakes, sweet desserts, ice cream, soda (diet or regular), sweet tea, candies, chips, cookies, store-bought juices, excessive alcohol (more than 1-2 drinks per day), lemonade, artificial sweeteners, donuts, coffee creamers, and sugar-free products. Additionally, the patient was advised to reduce the consumption of greasy, fatty foods and increase physical activity to support cardiovascular health. The patient verbalized understanding and is aware of the plan of care.   Lab Results  Component Value Date   CHOL 149 12/07/2022   HDL 74 12/07/2022   LDLCALC 66 12/07/2022   TRIG 40 12/07/2022   CHOLHDL 2.0 12/07/2022     Orders: -     Lipid panel -     CMP14+EGFR -     CBC with Differential/Platelet  Prediabetes Assessment & Plan: For managing prediabetes, I recommend the following lifestyle changes:  Reduce Intake of High-Sugar Foods and Beverages: Limit foods and drinks high in sugar to help regulate blood sugar levels. Increase Consumption of Nutrient-Rich Foods: Focus on incorporating more fruits, vegetables, and whole grains into your diet. Choose Lean Proteins: Opt for lean proteins such as chicken, fish, beans, and legumes. Select Low-Fat Dairy Products: Choose low-fat or non-fat dairy options.  Minimize Saturated Fats, Trans Fats, and Cholesterol: Reduce intake of foods high in saturated fats, trans fatty acids, and cholesterol. Engage in Regular Physical Activity: Aim for at least 30 minutes of brisk walking or other moderate  activity at least 5 days a week.  Lifestyle modifications for prediabetes were discussed, including adopting a heart-healthy diet and increasing physical activity. The patient was encouraged to decrease her intake of high-sugar foods and beverages. She verbalized understanding and is aware of the plan of care.    IFG (impaired fasting glucose) -     Hemoglobin A1c  Vitamin D deficiency -     VITAMIN D 25 Hydroxy (Vit-D Deficiency, Fractures)  TSH (thyroid-stimulating hormone deficiency) -     TSH + free T4  Note: This chart has been completed using Engineer, civil (consulting) software, and while attempts have been made to ensure accuracy, certain words and phrases may not be transcribed as intended.    Follow-up: Return in about 3 months (around 08/01/2023).   Gilmore Laroche, FNP

## 2023-05-04 NOTE — Patient Instructions (Signed)

## 2023-05-04 NOTE — Assessment & Plan Note (Signed)
 For managing prediabetes, I recommend the following lifestyle changes:  Reduce Intake of High-Sugar Foods and Beverages: Limit foods and drinks high in sugar to help regulate blood sugar levels. Increase Consumption of Nutrient-Rich Foods: Focus on incorporating more fruits, vegetables, and whole grains into your diet. Choose Lean Proteins: Opt for lean proteins such as chicken, fish, beans, and legumes. Select Low-Fat Dairy Products: Choose low-fat or non-fat dairy options. Minimize Saturated Fats, Trans Fats, and Cholesterol: Reduce intake of foods high in saturated fats, trans fatty acids, and cholesterol. Engage in Regular Physical Activity: Aim for at least 30 minutes of brisk walking or other moderate activity at least 5 days a week.  Lifestyle modifications for prediabetes were discussed, including adopting a heart-healthy diet and increasing physical activity. The patient was encouraged to decrease her intake of high-sugar foods and beverages. She verbalized understanding and is aware of the plan of care.

## 2023-05-04 NOTE — Assessment & Plan Note (Signed)
 The patient was encouraged to continue taking Lipitor 40 mg daily for cholesterol management. Lifestyle modifications were also discussed, including avoiding simple carbohydrates such as cakes, sweet desserts, ice cream, soda (diet or regular), sweet tea, candies, chips, cookies, store-bought juices, excessive alcohol (more than 1-2 drinks per day), lemonade, artificial sweeteners, donuts, coffee creamers, and sugar-free products. Additionally, the patient was advised to reduce the consumption of greasy, fatty foods and increase physical activity to support cardiovascular health. The patient verbalized understanding and is aware of the plan of care.   Lab Results  Component Value Date   CHOL 149 12/07/2022   HDL 74 12/07/2022   LDLCALC 66 12/07/2022   TRIG 40 12/07/2022   CHOLHDL 2.0 12/07/2022

## 2023-05-04 NOTE — Assessment & Plan Note (Signed)
 Controlled She takes in the enalapril 20 mg daily, furosemide 20 mg as needed, hydrochlorothiazide 25 mg daily and diltiazem 360 mg daily She denies headaches, dizziness, blurred vision Encouraged low-sodium diet with increased physical activity BP Readings from Last 3 Encounters:  05/04/23 125/84  12/07/22 138/82  10/12/22 136/70

## 2023-05-05 LAB — CMP14+EGFR
ALT: 18 [IU]/L (ref 0–32)
AST: 20 [IU]/L (ref 0–40)
Albumin: 4.2 g/dL (ref 3.8–4.8)
Alkaline Phosphatase: 79 [IU]/L (ref 44–121)
BUN/Creatinine Ratio: 24 (ref 12–28)
BUN: 17 mg/dL (ref 8–27)
Bilirubin Total: 0.5 mg/dL (ref 0.0–1.2)
CO2: 25 mmol/L (ref 20–29)
Calcium: 9.4 mg/dL (ref 8.7–10.3)
Chloride: 97 mmol/L (ref 96–106)
Creatinine, Ser: 0.72 mg/dL (ref 0.57–1.00)
Globulin, Total: 2.2 g/dL (ref 1.5–4.5)
Glucose: 116 mg/dL — ABNORMAL HIGH (ref 70–99)
Potassium: 4.4 mmol/L (ref 3.5–5.2)
Sodium: 140 mmol/L (ref 134–144)
Total Protein: 6.4 g/dL (ref 6.0–8.5)
eGFR: 87 mL/min/{1.73_m2} (ref >59)

## 2023-05-05 LAB — HEMOGLOBIN A1C
Est. average glucose Bld gHb Est-mCnc: 123 mg/dL
Hgb A1c MFr Bld: 5.9 % — ABNORMAL HIGH (ref 4.8–5.6)

## 2023-05-05 LAB — LIPID PANEL
Chol/HDL Ratio: 2.1 {ratio} (ref 0.0–4.4)
Cholesterol, Total: 158 mg/dL (ref 100–199)
HDL: 74 mg/dL (ref >39)
LDL Chol Calc (NIH): 73 mg/dL (ref 0–99)
Triglycerides: 53 mg/dL (ref 0–149)
VLDL Cholesterol Cal: 11 mg/dL (ref 5–40)

## 2023-05-05 LAB — TSH+FREE T4
Free T4: 1.35 ng/dL (ref 0.82–1.77)
TSH: 1.74 u[IU]/mL (ref 0.450–4.500)

## 2023-05-05 LAB — CBC WITH DIFFERENTIAL/PLATELET
Basophils Absolute: 0.1 10*3/uL (ref 0.0–0.2)
Basos: 1 %
EOS (ABSOLUTE): 0.1 10*3/uL (ref 0.0–0.4)
Eos: 2 %
Hematocrit: 38.7 % (ref 34.0–46.6)
Hemoglobin: 13.5 g/dL (ref 11.1–15.9)
Immature Grans (Abs): 0 10*3/uL (ref 0.0–0.1)
Immature Granulocytes: 0 %
Lymphocytes Absolute: 1.9 10*3/uL (ref 0.7–3.1)
Lymphs: 40 %
MCH: 33 pg (ref 26.6–33.0)
MCHC: 34.9 g/dL (ref 31.5–35.7)
MCV: 95 fL (ref 79–97)
Monocytes Absolute: 0.5 10*3/uL (ref 0.1–0.9)
Monocytes: 11 %
Neutrophils Absolute: 2.2 10*3/uL (ref 1.4–7.0)
Neutrophils: 46 %
Platelets: 311 10*3/uL (ref 150–450)
RBC: 4.09 x10E6/uL (ref 3.77–5.28)
RDW: 11 % — ABNORMAL LOW (ref 11.7–15.4)
WBC: 4.7 10*3/uL (ref 3.4–10.8)

## 2023-05-05 LAB — VITAMIN D 25 HYDROXY (VIT D DEFICIENCY, FRACTURES): Vit D, 25-Hydroxy: 60.4 ng/mL (ref 30.0–100.0)

## 2023-05-12 ENCOUNTER — Encounter: Payer: Self-pay | Admitting: Family Medicine

## 2023-05-16 ENCOUNTER — Other Ambulatory Visit: Payer: Self-pay

## 2023-05-16 MED ORDER — DILTIAZEM HCL ER COATED BEADS 360 MG PO CP24: 360.0000 mg | ORAL_CAPSULE | Freq: Every day | ORAL | 3 refills | Status: AC

## 2023-05-26 ENCOUNTER — Encounter (INDEPENDENT_AMBULATORY_CARE_PROVIDER_SITE_OTHER): Payer: Self-pay | Admitting: *Deleted

## 2023-06-02 ENCOUNTER — Other Ambulatory Visit: Payer: Self-pay

## 2023-06-02 MED ORDER — ENALAPRIL MALEATE 20 MG PO TABS: 20.0000 mg | ORAL_TABLET | Freq: Every day | ORAL | 3 refills | Status: AC

## 2023-06-14 DIAGNOSIS — Z1283 Encounter for screening for malignant neoplasm of skin: Secondary | ICD-10-CM | POA: Diagnosis not present

## 2023-06-14 DIAGNOSIS — D225 Melanocytic nevi of trunk: Secondary | ICD-10-CM | POA: Diagnosis not present

## 2023-06-14 DIAGNOSIS — Z85828 Personal history of other malignant neoplasm of skin: Secondary | ICD-10-CM | POA: Diagnosis not present

## 2023-06-14 DIAGNOSIS — Z08 Encounter for follow-up examination after completed treatment for malignant neoplasm: Secondary | ICD-10-CM | POA: Diagnosis not present

## 2023-06-16 ENCOUNTER — Other Ambulatory Visit: Payer: Self-pay

## 2023-06-16 MED ORDER — HYDROCHLOROTHIAZIDE 25 MG PO TABS: 25.0000 mg | ORAL_TABLET | Freq: Every morning | ORAL | 3 refills | Status: AC

## 2023-07-18 ENCOUNTER — Encounter: Payer: Self-pay | Admitting: Cardiology

## 2023-07-18 DIAGNOSIS — E782 Mixed hyperlipidemia: Secondary | ICD-10-CM

## 2023-07-19 MED ORDER — ATORVASTATIN CALCIUM 40 MG PO TABS: ORAL_TABLET | ORAL | 3 refills | Status: DC

## 2023-07-27 ENCOUNTER — Encounter: Payer: Self-pay | Admitting: Family Medicine

## 2023-08-01 NOTE — Telephone Encounter (Signed)
 Called patient she will keep appt for 06.27.2025

## 2023-08-18 NOTE — Progress Notes (Signed)
   08/18/2023  Patient ID: Sara Terry, female   DOB: [birth year], 78 y.o.   MRN: [id number]  Pharmacy Quality Measure Review  This patient is appearing on a report for being at risk of failing the adherence measure for cholesterol (statin) medications this calendar year.   Medication: Atorvastatin  40mg , last filled 07/19/2023 for 90 DS.  Insurance report was not up to date. No action needed at this time.   Rolando Cliche, PharmD, BCGP Clinical Pharmacist  [phone]

## 2023-09-01 ENCOUNTER — Encounter: Payer: Self-pay | Admitting: Family Medicine

## 2023-09-01 ENCOUNTER — Ambulatory Visit (INDEPENDENT_AMBULATORY_CARE_PROVIDER_SITE_OTHER): Payer: Medicare HMO | Admitting: Family Medicine

## 2023-09-01 VITALS — BP 138/72 | HR 99 | Wt 178.1 lb

## 2023-09-01 DIAGNOSIS — H6123 Impacted cerumen, bilateral: Secondary | ICD-10-CM | POA: Diagnosis not present

## 2023-09-01 DIAGNOSIS — R7301 Impaired fasting glucose: Secondary | ICD-10-CM | POA: Diagnosis not present

## 2023-09-01 DIAGNOSIS — I10 Essential (primary) hypertension: Secondary | ICD-10-CM | POA: Diagnosis not present

## 2023-09-01 DIAGNOSIS — E7849 Other hyperlipidemia: Secondary | ICD-10-CM | POA: Diagnosis not present

## 2023-09-01 DIAGNOSIS — E559 Vitamin D deficiency, unspecified: Secondary | ICD-10-CM

## 2023-09-01 DIAGNOSIS — E038 Other specified hypothyroidism: Secondary | ICD-10-CM | POA: Diagnosis not present

## 2023-09-01 NOTE — Assessment & Plan Note (Addendum)
 Impacted Cerumen - Bilateral  The patient may use an over-the-counter Debrox ear wax removal kit as directed to help soften and clear the ear wax. Follow package instructions, and if symptoms persist or worsen, follow up for further evaluation and manual removal if needed.

## 2023-09-01 NOTE — Assessment & Plan Note (Signed)
 Controlled She takes in the enalapril  20 mg daily, furosemide  20 mg as needed, hydrochlorothiazide  25 mg daily and diltiazem  360 mg daily She denies headaches, dizziness, blurred vision Encouraged low-sodium diet with increased physical activity BP Readings from Last 3 Encounters:  09/01/23 138/72  05/04/23 125/84  12/07/22 138/82

## 2023-09-01 NOTE — Assessment & Plan Note (Addendum)
 SABRA

## 2023-09-01 NOTE — Progress Notes (Signed)
 Established Patient Office Visit  Subjective:  Patient ID: Sara Terry, female    DOB: October 15, 1945  Age: 78 y.o. MRN: [id number]  CC:  Chief Complaint  Patient presents with   Follow-up   Ear Fullness    Symptoms now include muffled sound in bilateral ears, right over left, no prior treatment    Hypertension    HPI Sara Terry is a 78 y.o. female with past medical history of hypertension presents for f/u the above complaints. No fever, ear pain, hearing loss or drainage reported.   Past Medical History:  Diagnosis Date   Arthritis    right hip and right knee (had right hip replacement)   Atrial fibrillation (HCC)    Essential hypertension    GERD (gastroesophageal reflux disease)    Glucose intolerance (impaired glucose tolerance)    Insomnia    Raynaud phenomenon    Skin cancer    Forehead    Past Surgical History:  Procedure Laterality Date   ABDOMINOPLASTY     BREAST REDUCTION SURGERY  2003   COLONOSCOPY     COLONOSCOPY N/A 06/13/2013   Procedure: COLONOSCOPY;  Surgeon: Claudis RAYMOND Rivet, MD;  Location: [facility];  Service: Endoscopy;  Laterality: N/A;  930   COSMETIC SURGERY     FOOT SURGERY Left 02/09/2016   JOINT REPLACEMENT     TONSILLECTOMY     TOTAL HIP ARTHROPLASTY Right 04/27/2016   Procedure: RIGHT TOTAL HIP ARTHROPLASTY ANTERIOR APPROACH;  Surgeon: Dempsey Moan, MD;  Location: [facility];  Service: Orthopedics;  Laterality: Right;   TUBAL LIGATION      Family History  Problem Relation Age of Onset   Hypertension Mother    Hypertension Father    Diabetes Father    Heart attack Father    Heart disease Father    Hypertension Brother     Social History   Socioeconomic History   Marital status: Married    Spouse name: Not on file   Number of children: 2   Years of education: Not on file   Highest education level: Associate degree: occupational, Scientist, product/process development, or vocational program  Occupational History   Occupation: [employer] PT now     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 [employer]  Tobacco Use   Smoking status: Former    Current packs/day: 0.00    Types: Cigarettes    Start date: 03/07/1964    Quit date: 03/07/1978    Years since quitting: 45.5   Smokeless tobacco: Never  Vaping Use   Vaping status: Never Used  Substance and Sexual Activity   Alcohol use: Yes    Alcohol/week: 21.0 standard drinks of alcohol    Types: 21 Glasses of wine per week    Comment: 2.5 glasses of wine daily   Drug use: No   Sexual activity: Never    Birth control/protection: Surgical, Post-menopausal  Other Topics Concern   Not on file  Social History Narrative   1 child at [employer] and another in [place], [place]   Social Drivers of Health   Financial Resource Strain: Low Risk  (08/28/2023)   Overall Financial Resource Strain (CARDIA)    Difficulty of Paying Living Expenses: Not very hard  Food Insecurity: No Food Insecurity (08/28/2023)   Hunger Vital Sign    Worried About Running Out of Food in the Last Year: Never true    Ran Out of Food in the Last Year: Never true  Transportation Needs: No  Transportation Needs (08/28/2023)   PRAPARE - Administrator, Civil Service (Medical): No    Lack of Transportation (Non-Medical): No  Physical Activity: Sufficiently Active (08/28/2023)   Exercise Vital Sign    Days of Exercise per Week: 3 days    Minutes of Exercise per Session: 50 min  Stress: No Stress Concern Present (08/28/2023)   [employer] of Occupational Health - Occupational Stress Questionnaire    Feeling of Stress: Not at all  Social Connections: Moderately Isolated (08/28/2023)   Social Connection and Isolation Panel    Frequency of Communication with Friends and Family: Three times a week    Frequency of Social Gatherings with Friends and Family: Once a week    Attends Religious Services: More than 4 times per year    Active Member of [employer] or Organizations: No    Attends Banker  Meetings: Not on file    Marital Status: Widowed  Intimate Partner Violence: Not At Risk (11/30/2022)   Humiliation, Afraid, Rape, and Kick questionnaire    Fear of Current or Ex-Partner: No    Emotionally Abused: No    Physically Abused: No    Sexually Abused: No    Outpatient Medications Prior to Visit  Medication Sig Dispense Refill   apixaban  (ELIQUIS ) 5 MG TABS tablet Take 1 tablet (5 mg total) by mouth 2 (two) times daily. 180 tablet 1   atorvastatin  (LIPITOR) 40 MG tablet TAKE 1 TABLET DAILY AT 6PM 90 tablet 3   calcium  carbonate (OS-CAL - DOSED IN MG OF ELEMENTAL CALCIUM ) 1250 (500 Ca) MG tablet Take 1 tablet by mouth 2 (two) times daily with a meal.      diltiazem  (CARDIZEM  CD) 360 MG 24 hr capsule Take 1 capsule (360 mg total) by mouth daily. 90 capsule 3   docusate sodium  (COLACE) 100 MG capsule Take 100 mg by mouth daily.     enalapril  (VASOTEC ) 20 MG tablet Take 1 tablet (20 mg total) by mouth daily. 90 tablet 3   furosemide  (LASIX ) 20 MG tablet Take 1 tablet (20 mg total) by mouth as needed for edema. 30 tablet 11   hydrochlorothiazide  (HYDRODIURIL ) 25 MG tablet Take 1 tablet (25 mg total) by mouth every morning. 90 tablet 3   zolpidem  (AMBIEN ) 10 MG tablet Take 0.5 tablets (5 mg total) by mouth at bedtime as needed for sleep. 30 tablet 0   No facility-administered medications prior to visit.    Allergies  Allergen Reactions   Maxzide [Triamterene-Hctz]    Penicillins Hives    Has patient had a PCN reaction causing immediate rash, facial/tongue/throat swelling, SOB or lightheadedness with hypotension: Yes Has patient had a PCN reaction causing severe rash involving mucus membranes or skin necrosis: No Has patient had a PCN reaction that required hospitalization No Has patient had a PCN reaction occurring within the last 10 years: No If all of the above answers are NO, then may proceed with Cephalosporin use.     ROS Review of Systems  Constitutional:  Negative  for chills and fever.  HENT:         Bil ear fullness  Eyes:  Negative for visual disturbance.  Respiratory:  Negative for chest tightness and shortness of breath.   Neurological:  Negative for dizziness and headaches.      Objective:    Physical Exam HENT:     Head: Normocephalic.     Right Ear: There is impacted cerumen.     Left  Ear: There is impacted cerumen.     Mouth/Throat:     Mouth: Mucous membranes are moist.   Cardiovascular:     Rate and Rhythm: Normal rate.     Heart sounds: Normal heart sounds.  Pulmonary:     Effort: Pulmonary effort is normal.     Breath sounds: Normal breath sounds.   Neurological:     Mental Status: She is alert.     BP 138/72   Pulse 99   Wt 178 lb 1.3 oz (80.8 kg)   SpO2 96%   BMI 29.63 kg/m  Wt Readings from Last 3 Encounters:  09/01/23 178 lb 1.3 oz (80.8 kg)  05/04/23 187 lb (84.8 kg)  12/07/22 185 lb (83.9 kg)    Lab Results  Component Value Date   TSH 1.740 05/04/2023   Lab Results  Component Value Date   WBC 4.7 05/04/2023   HGB 13.5 05/04/2023   HCT 38.7 05/04/2023   MCV 95 05/04/2023   PLT 311 05/04/2023   Lab Results  Component Value Date   NA 140 05/04/2023   K 4.4 05/04/2023   CO2 25 05/04/2023   GLUCOSE 116 (H) 05/04/2023   BUN 17 05/04/2023   CREATININE 0.72 05/04/2023   BILITOT 0.5 05/04/2023   ALKPHOS 79 05/04/2023   AST 20 05/04/2023   ALT 18 05/04/2023   PROT 6.4 05/04/2023   ALBUMIN 4.2 05/04/2023   CALCIUM  9.4 05/04/2023   ANIONGAP 8 04/12/2018   EGFR 87 05/04/2023   Lab Results  Component Value Date   CHOL 158 05/04/2023   Lab Results  Component Value Date   HDL 74 05/04/2023   Lab Results  Component Value Date   LDLCALC 73 05/04/2023   Lab Results  Component Value Date   TRIG 53 05/04/2023   Lab Results  Component Value Date   CHOLHDL 2.1 05/04/2023   Lab Results  Component Value Date   HGBA1C 5.9 (H) 05/04/2023      Assessment & Plan:  Essential hypertension,  benign Assessment & Plan: Controlled She takes in the enalapril  20 mg daily, furosemide  20 mg as needed, hydrochlorothiazide  25 mg daily and diltiazem  360 mg daily She denies headaches, dizziness, blurred vision Encouraged low-sodium diet with increased physical activity BP Readings from Last 3 Encounters:  09/01/23 138/72  05/04/23 125/84  12/07/22 138/82      Impacted cerumen, bilateral Assessment & Plan: Impacted Cerumen - Bilateral  The patient may use an over-the-counter Debrox ear wax removal kit as directed to help soften and clear the ear wax. Follow package instructions, and if symptoms persist or worsen, follow up for further evaluation and manual removal if needed.   IFG (impaired fasting glucose) -     Hemoglobin A1c  Vitamin D  deficiency -     VITAMIN D  25 Hydroxy (Vit-D Deficiency, Fractures)  TSH (thyroid-stimulating hormone deficiency) -     TSH + free T4  Other hyperlipidemia -     Lipid panel -     CMP14+EGFR -     CBC with Differential/Platelet  Note: This chart has been completed using Engineer, civil (consulting) software, and while attempts have been made to ensure accuracy, certain words and phrases may not be transcribed as intended.    Follow-up: Return in about 4 months (around 01/01/2024).   Joseandres Mazer, FNP

## 2023-09-01 NOTE — Patient Instructions (Addendum)
 I appreciate the opportunity to provide care to you today!    Follow up:  4 months  Labs: please stop by the lab today to get your blood drawn (CBC, CMP, TSH, Lipid profile, HgA1c, Vit D)  Impacted Cerumen - Bilateral  Encouraged to use an over-the-counter Debrox ear wax removal kit as directed to help soften and clear the ear wax. Follow package instructions, and if symptoms persist or worsen, follow up for further evaluation and manual removal if needed.  For a Healthier YOU, I Recommend: Reducing your intake of sugar, sodium, carbohydrates, and saturated fats. Increasing your fiber intake by incorporating more whole grains, fruits, and vegetables into your meals. Setting healthy goals with a focus on lowering your consumption of carbs, sugar, and unhealthy fats. Adding variety to your diet by including a wide range of fruits and vegetables. Cutting back on soda and limiting processed foods as much as possible. Staying active: In addition to taking your weight loss medication, aim for at least 150 minutes of moderate-intensity physical activity each week for optimal results.    Please continue to a heart-healthy diet and increase your physical activities. Try to exercise for at least five days a week.    It was a pleasure to see you and I look forward to continuing to work together on your health and well-being. Please do not hesitate to call the office if you need care or have questions about your care.  In case of emergency, please visit the Emergency Department for urgent care, or contact our clinic at [phone] to schedule an appointment. We're here to help you!   Have a wonderful day and week. With Gratitude, Yair Dusza MSN, FNP-BC

## 2023-09-02 LAB — TSH+FREE T4
Free T4: 1.48 ng/dL (ref 0.82–1.77)
TSH: 1.96 u[IU]/mL (ref 0.450–4.500)

## 2023-09-02 LAB — CMP14+EGFR
ALT: 16 IU/L (ref 0–32)
AST: 19 IU/L (ref 0–40)
Albumin: 4.3 g/dL (ref 3.8–4.8)
Alkaline Phosphatase: 73 IU/L (ref 44–121)
BUN/Creatinine Ratio: 23 (ref 12–28)
BUN: 15 mg/dL (ref 8–27)
Bilirubin Total: 0.5 mg/dL (ref 0.0–1.2)
CO2: 22 mmol/L (ref 20–29)
Calcium: 9.6 mg/dL (ref 8.7–10.3)
Chloride: 99 mmol/L (ref 96–106)
Creatinine, Ser: 0.65 mg/dL (ref 0.57–1.00)
Globulin, Total: 2 g/dL (ref 1.5–4.5)
Glucose: 97 mg/dL (ref 70–99)
Potassium: 4.3 mmol/L (ref 3.5–5.2)
Sodium: 138 mmol/L (ref 134–144)
Total Protein: 6.3 g/dL (ref 6.0–8.5)
eGFR: 91 mL/min/{1.73_m2} (ref >59)

## 2023-09-02 LAB — CBC WITH DIFFERENTIAL/PLATELET
Basophils Absolute: 0 10*3/uL (ref 0.0–0.2)
Basos: 1 %
EOS (ABSOLUTE): 0.1 10*3/uL (ref 0.0–0.4)
Eos: 2 %
Hematocrit: 37.7 % (ref 34.0–46.6)
Hemoglobin: 12.5 g/dL (ref 11.1–15.9)
Immature Grans (Abs): 0 10*3/uL (ref 0.0–0.1)
Immature Granulocytes: 0 %
Lymphocytes Absolute: 1.6 10*3/uL (ref 0.7–3.1)
Lymphs: 30 %
MCH: 32.6 pg (ref 26.6–33.0)
MCHC: 33.2 g/dL (ref 31.5–35.7)
MCV: 98 fL — ABNORMAL HIGH (ref 79–97)
Monocytes Absolute: 0.5 10*3/uL (ref 0.1–0.9)
Monocytes: 10 %
Neutrophils Absolute: 3 10*3/uL (ref 1.4–7.0)
Neutrophils: 57 %
Platelets: 279 10*3/uL (ref 150–450)
RBC: 3.83 x10E6/uL (ref 3.77–5.28)
RDW: 12.1 % (ref 11.7–15.4)
WBC: 5.3 10*3/uL (ref 3.4–10.8)

## 2023-09-02 LAB — LIPID PANEL
Chol/HDL Ratio: 2.5 ratio (ref 0.0–4.4)
Cholesterol, Total: 138 mg/dL (ref 100–199)
HDL: 55 mg/dL
LDL Chol Calc (NIH): 73 mg/dL (ref 0–99)
Triglycerides: 45 mg/dL (ref 0–149)
VLDL Cholesterol Cal: 10 mg/dL (ref 5–40)

## 2023-09-02 LAB — HEMOGLOBIN A1C
Est. average glucose Bld gHb Est-mCnc: 120 mg/dL
Hgb A1c MFr Bld: 5.8 % — ABNORMAL HIGH (ref 4.8–5.6)

## 2023-09-02 LAB — VITAMIN D 25 HYDROXY (VIT D DEFICIENCY, FRACTURES): Vit D, 25-Hydroxy: 65.9 ng/mL (ref 30.0–100.0)

## 2023-09-08 ENCOUNTER — Ambulatory Visit: Payer: Self-pay | Admitting: Family Medicine

## 2023-10-02 DIAGNOSIS — M25561 Pain in right knee: Secondary | ICD-10-CM | POA: Diagnosis not present

## 2023-10-23 ENCOUNTER — Ambulatory Visit: Attending: Cardiology | Admitting: Cardiology

## 2023-10-23 ENCOUNTER — Encounter: Payer: Self-pay | Admitting: Cardiology

## 2023-10-23 ENCOUNTER — Other Ambulatory Visit: Payer: Self-pay | Admitting: *Deleted

## 2023-10-23 VITALS — BP 134/86 | HR 85 | Ht 65.5 in | Wt 173.0 lb

## 2023-10-23 DIAGNOSIS — I4821 Permanent atrial fibrillation: Secondary | ICD-10-CM | POA: Diagnosis not present

## 2023-10-23 DIAGNOSIS — E782 Mixed hyperlipidemia: Secondary | ICD-10-CM

## 2023-10-23 DIAGNOSIS — I10 Essential (primary) hypertension: Secondary | ICD-10-CM | POA: Diagnosis not present

## 2023-10-23 MED ORDER — APIXABAN 5 MG PO TABS
5.0000 mg | ORAL_TABLET | Freq: Two times a day (BID) | ORAL | 1 refills | Status: AC
Start: 2023-10-23 — End: ?

## 2023-10-23 NOTE — Progress Notes (Signed)
    Cardiology Office Note  Date: 10/23/2023   ID: Calia, Napp Jul 27, 1945, MRN [id number]  History of Present Illness: Sara Terry is a 78 y.o. female last seen in August 2024.  She is here for a routine visit.  Reports no palpitations, no unusual shortness of breath with typical activities.  Still working part-time at [employer].  She does not report any frank syncope, occasionally feels lightheaded, but no progressive pattern.  We went over her medications.  She does not report any spontaneous bleeding problems on Eliquis .  No interval Lasix  requirement for leg edema.  I reviewed her interval lab work from June.  I reviewed her ECG today which shows atrial fibrillation.  Physical Exam: VS:  BP 134/86 (BP Location: Left Arm, Cuff Size: Normal)   Pulse 85   Ht 5' 5.5 (1.664 m)   Wt 173 lb (78.5 kg)   SpO2 97%   BMI 28.35 kg/m , BMI Body mass index is 28.35 kg/m.  Wt Readings from Last 3 Encounters:  10/23/23 173 lb (78.5 kg)  09/01/23 178 lb 1.3 oz (80.8 kg)  05/04/23 187 lb (84.8 kg)    General: Patient appears comfortable at rest. HEENT: Conjunctiva and lids normal. Neck: Supple, no elevated JVP or carotid bruits. Lungs: Clear to auscultation, nonlabored breathing at rest. Cardiac: Irregular irregular, no S3, 3/6 systolic murmur, no pericardial rub.  Extremities: No pitting edema.  ECG:  An ECG dated 10/12/2022 was personally reviewed today and demonstrated:  Atrial fibrillation.  Labwork: 09/01/2023: ALT 16; AST 19; BUN 15; Creatinine, Ser 0.65; Hemoglobin 12.5; Platelets 279; Potassium 4.3; Sodium 138; TSH 1.960     Component Value Date/Time   CHOL 138 09/01/2023 1044   TRIG 45 09/01/2023 1044   HDL 55 09/01/2023 1044   CHOLHDL 2.5 09/01/2023 1044   CHOLHDL 2.2 04/12/2018 0943   VLDL 7 04/12/2018 0943   LDLCALC 73 09/01/2023 1044   Other Studies Reviewed Today:  No interval cardiac testing for review today.  Assessment and Plan:  1.  Permanent atrial  fibrillation with CHA2DS2-VASc score of 4.  Asymptomatic and with adequate heart rate control on Cardizem  CD 360 mg daily.  Continue Eliquis  5 mg twice daily for stroke prophylaxis.  I reviewed her interval lab work from June.   2.  Primary hypertension.  No change in current regimen which also includes HCTZ 25 mg daily and Vasotec  20 mg daily.  Keep follow-up with PCP.   3.  Mixed hyperlipidemia.  Recent LDL 73.  Continue Lipitor 40 mg daily.   4.  Intermittent lower extremity edema.  This has been quiescent over the last year, has not required any interval Lasix .  Disposition:  Follow up 1 year.  Signed, Jayson JUDITHANN Sierras, M.D., F.A.C.C. [facility]

## 2023-10-23 NOTE — Telephone Encounter (Signed)
 Prescription refill request for Eliquis  received. Indication: AF Last office visit: 10/23/23  GORMAN Sierras MD Scr: 0.65 on 09/01/23  Epic Age: 78 Weight: 78.5kg  Based on above findings Eliquis  5mg  twice daily is the appropriate dose.  Refill approved.

## 2023-10-23 NOTE — Patient Instructions (Signed)
 Medication Instructions:  Your physician recommends that you continue on your current medications as directed. Please refer to the Current Medication list given to you today.   Labwork: None today  Testing/Procedures: None today  Follow-Up: 1 year  Any Other Special Instructions Will Be Listed Below (If Applicable).  If you need a refill on your cardiac medications before your next appointment, please call your pharmacy.

## 2023-11-07 DIAGNOSIS — M25552 Pain in left hip: Secondary | ICD-10-CM | POA: Diagnosis not present

## 2023-11-20 DIAGNOSIS — M1612 Unilateral primary osteoarthritis, left hip: Secondary | ICD-10-CM | POA: Diagnosis not present

## 2023-11-21 ENCOUNTER — Telehealth: Payer: Self-pay | Admitting: Cardiology

## 2023-11-21 NOTE — Telephone Encounter (Signed)
   Pre-operative Risk Assessment    Patient Name: Sara Terry  DOB: 08-May-1945 MRN: [id number]      Request for Surgical Clearance    Procedure:  Left total hip arthroplasty  Date of Surgery:  Clearance 02/07/24                                 Surgeon:  Dr. Dempsey Moan Surgeon's Group or Practice Name:  [facility] Phone number:  [phone] - Kirke Amel Scheduler Fax number:  [phone]   Type of Clearance Requested:   - Medical  - Pharmacy:  Hold if applicable      Type of Anesthesia:  Choice   Additional requests/questions:    SignedAlan KANDICE Corona   11/21/2023, 4:07 PM

## 2023-11-27 ENCOUNTER — Telehealth (HOSPITAL_BASED_OUTPATIENT_CLINIC_OR_DEPARTMENT_OTHER): Payer: Self-pay

## 2023-11-27 NOTE — Telephone Encounter (Signed)
 Appointmetn scheduled for 01/29/2024 @ 9am

## 2023-11-27 NOTE — Telephone Encounter (Signed)
 Patient with diagnosis of atrial fibrillation on Eliquis  for anticoagulation.    Procedure:  Left total hip arthroplasty   Date of Surgery:  Clearance 02/07/24    CHA2DS2-VASc Score = 4   This indicates a 4.8% annual risk of stroke. The patient's score is based upon: CHF History: 0 HTN History: 1 Diabetes History: 0 Stroke History: 0 Vascular Disease History: 0 Age Score: 2 Gender Score: 1   CrCl 90 Platelet count 279  Patient has not had an Afib/aflutter ablation or Watchman within the last 3 months or DCCV within the last 30 days   Per office protocol, patient can hold Eliquis  for 3 days prior to procedure.   Patient will not need bridging with Lovenox (enoxaparin) around procedure.  **This guidance is not considered finalized until pre-operative APP has relayed final recommendations.**

## 2023-11-27 NOTE — Telephone Encounter (Signed)
   Name: Sara Terry  DOB: 15-Oct-1945  MRN: [id number]  Primary Cardiologist: Jayson Sierras, MD   Preoperative team, please contact this patient and set up a phone call appointment for further preoperative risk assessment. Procedure is not scheduled until 02/07/2024 so telehealth visit does not need to be until mid to late October/ early November. Please obtain consent and complete medication review. Thank you for your help.  I confirm that guidance regarding antiplatelet and oral anticoagulation therapy has been completed and, if necessary, noted below. Per office protocol, patient can hold Eliquis  for 3 days prior to procedure.   Patient will not need bridging with Lovenox (enoxaparin) around procedure.  I also confirmed the patient resides in the state of [place] . As per [facility] Medical Board telemedicine laws, the patient must reside in the state in which the provider is licensed.   Rhett Najera E Inmer Nix, PA-C 11/27/2023, 2:44 PM [facility]

## 2023-11-27 NOTE — Telephone Encounter (Signed)
  Patient Consent for Virtual Visit         Sara Terry has provided verbal consent on 11/27/2023 for a virtual visit (video or telephone). Appointment scheduled for 01/29/2024 @ 9am. Med req and consent are complete. Call patient at [phone]   CONSENT FOR VIRTUAL VISIT FOR:  Sara Terry  By participating in this virtual visit I agree to the following:  I hereby voluntarily request, consent and authorize [facility] and its employed or contracted physicians, physician assistants, nurse practitioners or other licensed health care professionals (the Practitioner), to provide me with telemedicine health care services (the "Services) as deemed necessary by the treating Practitioner. I acknowledge and consent to receive the Services by the Practitioner via telemedicine. I understand that the telemedicine visit will involve communicating with the Practitioner through live audiovisual communication technology and the disclosure of certain medical information by electronic transmission. I acknowledge that I have been given the opportunity to request an in-person assessment or other available alternative prior to the telemedicine visit and am voluntarily participating in the telemedicine visit.  I understand that I have the right to withhold or withdraw my consent to the use of telemedicine in the course of my care at any time, without affecting my right to future care or treatment, and that the Practitioner or I may terminate the telemedicine visit at any time. I understand that I have the right to inspect all information obtained and/or recorded in the course of the telemedicine visit and may receive copies of available information for a reasonable fee.  I understand that some of the potential risks of receiving the Services via telemedicine include:  Delay or interruption in medical evaluation due to technological equipment failure or disruption; Information transmitted may not be  sufficient (e.g. poor resolution of images) to allow for appropriate medical decision making by the Practitioner; and/or  In rare instances, security protocols could fail, causing a breach of personal health information.  Furthermore, I acknowledge that it is my responsibility to provide information about my medical history, conditions and care that is complete and accurate to the best of my ability. I acknowledge that Practitioner's advice, recommendations, and/or decision may be based on factors not within their control, such as incomplete or inaccurate data provided by me or distortions of diagnostic images or specimens that may result from electronic transmissions. I understand that the practice of medicine is not an exact science and that Practitioner makes no warranties or guarantees regarding treatment outcomes. I acknowledge that a copy of this consent can be made available to me via my patient portal [facility] MyChart), or I can request a printed copy by calling the office of [facility].    I understand that my insurance will be billed for this visit.   I have read or had this consent read to me. I understand the contents of this consent, which adequately explains the benefits and risks of the Services being provided via telemedicine.  I have been provided ample opportunity to ask questions regarding this consent and the Services and have had my questions answered to my satisfaction. I give my informed consent for the services to be provided through the use of telemedicine in my medical care

## 2023-12-07 DIAGNOSIS — M1612 Unilateral primary osteoarthritis, left hip: Secondary | ICD-10-CM | POA: Diagnosis not present

## 2023-12-25 ENCOUNTER — Encounter: Payer: Self-pay | Admitting: Family Medicine

## 2023-12-25 ENCOUNTER — Ambulatory Visit (INDEPENDENT_AMBULATORY_CARE_PROVIDER_SITE_OTHER): Admitting: Family Medicine

## 2023-12-25 VITALS — BP 137/86 | HR 102 | Resp 16 | Ht 66.0 in | Wt 171.8 lb

## 2023-12-25 DIAGNOSIS — Z01818 Encounter for other preprocedural examination: Secondary | ICD-10-CM | POA: Diagnosis not present

## 2023-12-25 NOTE — Assessment & Plan Note (Signed)
 Negative urinalysis pending CBC BMP

## 2023-12-25 NOTE — Patient Instructions (Addendum)
 I appreciate the opportunity to provide care to you today!   Labs: please stop by the lab today to get your blood drawn (CBC, BMP)   For a Healthier YOU, I Recommend: Reducing your intake of sugar, sodium, carbohydrates, and saturated fats. Increasing your fiber intake by incorporating more whole grains, fruits, and vegetables into your meals. Setting healthy goals with a focus on lowering your consumption of carbs, sugar, and unhealthy fats. Adding variety to your diet by including a wide range of fruits and vegetables. Cutting back on soda and limiting processed foods as much as possible. Staying active: In addition to taking your weight loss medication, aim for at least 150 minutes of moderate-intensity physical activity each week for optimal results.    Please follow up if your symptoms worsen or fail to improve.     Please continue to a heart-healthy diet and increase your physical activities. Try to exercise for at least five days a week.    It was a pleasure to see you and I look forward to continuing to work together on your health and well-being. Please do not hesitate to call the office if you need care or have questions about your care.  In case of emergency, please visit the Emergency Department for urgent care, or contact our clinic at [phone] to schedule an appointment. We're here to help you!   Have a wonderful day and week. With Gratitude, Meade JENEANE Gerlach MSN, FNP-BC, PMHNP-BC

## 2023-12-25 NOTE — Progress Notes (Signed)
 Established Patient Office Visit  Subjective:  Patient ID: Sara Terry, female    DOB: [birth year]  Age: 78 y.o. MRN: [id number]  CC:  Chief Complaint  Patient presents with   Medical Clearance    For left hip surgery scheduled for 12/3    HPI Sara Terry is a 78 y.o. female with past medical history of pretension, hyperlipidemia, osteoarthritis of the hip presents for medical clearance.  For the details of today's visit, please refer to the assessment and plan.     Past Medical History:  Diagnosis Date   Arthritis    right hip and right knee (had right hip replacement)   Atrial fibrillation (HCC)    Essential hypertension    GERD (gastroesophageal reflux disease)    Glucose intolerance (impaired glucose tolerance)    Insomnia    Raynaud phenomenon    Skin cancer    Forehead    Past Surgical History:  Procedure Laterality Date   ABDOMINOPLASTY     BREAST REDUCTION SURGERY  2003   COLONOSCOPY     COLONOSCOPY N/A 06/13/2013   Procedure: COLONOSCOPY;  Surgeon: Claudis RAYMOND Rivet, MD;  Location: [facility];  Service: Endoscopy;  Laterality: N/A;  930   COSMETIC SURGERY     FOOT SURGERY Left 02/09/2016   JOINT REPLACEMENT     TONSILLECTOMY     TOTAL HIP ARTHROPLASTY Right 04/27/2016   Procedure: RIGHT TOTAL HIP ARTHROPLASTY ANTERIOR APPROACH;  Surgeon: Dempsey Moan, MD;  Location: [facility];  Service: Orthopedics;  Laterality: Right;   TUBAL LIGATION      Family History  Problem Relation Age of Onset   Hypertension Mother    Hypertension Father    Diabetes Father    Heart attack Father    Heart disease Father    Hypertension Brother     Social History   Socioeconomic History   Marital status: Married    Spouse name: Not on file   Number of children: 2   Years of education: Not on file   Highest education level: Associate degree: occupational, Scientist, product/process development, or vocational program  Occupational History   Occupation: [employer] now    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 [employer]  Tobacco Use   Smoking status: Former    Current packs/day: 0.00    Types: Cigarettes    Start date: 03/07/1964    Quit date: 03/07/1978    Years since quitting: 45.8   Smokeless tobacco: Never  Vaping Use   Vaping status: Never Used  Substance and Sexual Activity   Alcohol use: Yes    Alcohol/week: 21.0 standard drinks of alcohol    Types: 21 Glasses of wine per week    Comment: 2.5 glasses of wine daily   Drug use: No   Sexual activity: Never    Birth control/protection: Surgical, Post-menopausal  Other Topics Concern   Not on file  Social History Narrative   1 child at [employer] and another in [place], [place]   Social Drivers of Health   Financial Resource Strain: Low Risk  (12/21/2023)   Overall Financial Resource Strain (CARDIA)    Difficulty of Paying Living Expenses: Not hard at all  Food Insecurity: No Food Insecurity (12/21/2023)   Hunger Vital Sign    Worried About Running Out of Food in the Last Year: Never true    Ran Out of Food in the Last Year: Never true  Transportation Needs: No Transportation Needs (12/21/2023)  PRAPARE - Administrator, Civil Service (Medical): No    Lack of Transportation (Non-Medical): No  Physical Activity: Insufficiently Active (12/21/2023)   Exercise Vital Sign    Days of Exercise per Week: 2 days    Minutes of Exercise per Session: 50 min  Stress: No Stress Concern Present (12/21/2023)   [employer] of Occupational Health - Occupational Stress Questionnaire    Feeling of Stress: Not at all  Social Connections: Socially Isolated (12/21/2023)   Social Connection and Isolation Panel    Frequency of Communication with Friends and Family: Once a week    Frequency of Social Gatherings with Friends and Family: Once a week    Attends Religious Services: More than 4 times per year    Active Member of [employer] or Organizations: No    Attends Banker Meetings: Not on  file    Marital Status: Widowed  Intimate Partner Violence: Not At Risk (11/30/2022)   Humiliation, Afraid, Rape, and Kick questionnaire    Fear of Current or Ex-Partner: No    Emotionally Abused: No    Physically Abused: No    Sexually Abused: No    Outpatient Medications Prior to Visit  Medication Sig Dispense Refill   apixaban  (ELIQUIS ) 5 MG TABS tablet Take 1 tablet (5 mg total) by mouth 2 (two) times daily. 180 tablet 1   atorvastatin  (LIPITOR) 40 MG tablet TAKE 1 TABLET DAILY AT 6PM 90 tablet 3   calcium  carbonate (OS-CAL - DOSED IN MG OF ELEMENTAL CALCIUM ) 1250 (500 Ca) MG tablet Take 1 tablet by mouth 2 (two) times daily with a meal.      diltiazem  (CARDIZEM  CD) 360 MG 24 hr capsule Take 1 capsule (360 mg total) by mouth daily. 90 capsule 3   docusate sodium  (COLACE) 100 MG capsule Take 100 mg by mouth daily.     enalapril  (VASOTEC ) 20 MG tablet Take 1 tablet (20 mg total) by mouth daily. 90 tablet 3   furosemide  (LASIX ) 20 MG tablet Take 1 tablet (20 mg total) by mouth as needed for edema. 30 tablet 11   hydrochlorothiazide  (HYDRODIURIL ) 25 MG tablet Take 1 tablet (25 mg total) by mouth every morning. 90 tablet 3   zolpidem  (AMBIEN ) 10 MG tablet Take 0.5 tablets (5 mg total) by mouth at bedtime as needed for sleep. 30 tablet 0   No facility-administered medications prior to visit.    Allergies  Allergen Reactions   Maxzide [Triamterene-Hctz]    Penicillins Hives    Has patient had a PCN reaction causing immediate rash, facial/tongue/throat swelling, SOB or lightheadedness with hypotension: Yes Has patient had a PCN reaction causing severe rash involving mucus membranes or skin necrosis: No Has patient had a PCN reaction that required hospitalization No Has patient had a PCN reaction occurring within the last 10 years: No If all of the above answers are NO, then may proceed with Cephalosporin use.     ROS Review of Systems  Constitutional:  Negative for chills and  fever.  Eyes:  Negative for visual disturbance.  Respiratory:  Negative for chest tightness and shortness of breath.   Neurological:  Negative for dizziness and headaches.      Objective:    Physical Exam HENT:     Head: Normocephalic.     Mouth/Throat:     Mouth: Mucous membranes are moist.  Cardiovascular:     Rate and Rhythm: Normal rate.     Heart sounds: Normal heart sounds.  Pulmonary:     Effort: Pulmonary effort is normal.     Breath sounds: Normal breath sounds.  Neurological:     Mental Status: She is alert.     BP 137/86   Pulse (!) 102   Resp 16   Ht 5' 6 (1.676 m)   Wt 171 lb 12.8 oz (77.9 kg)   SpO2 97%   BMI 27.73 kg/m  Wt Readings from Last 3 Encounters:  12/25/23 171 lb 12.8 oz (77.9 kg)  10/23/23 173 lb (78.5 kg)  09/01/23 178 lb 1.3 oz (80.8 kg)    Lab Results  Component Value Date   TSH 1.960 09/01/2023   Lab Results  Component Value Date   WBC 5.3 09/01/2023   HGB 12.5 09/01/2023   HCT 37.7 09/01/2023   MCV 98 (H) 09/01/2023   PLT 279 09/01/2023   Lab Results  Component Value Date   NA 138 09/01/2023   K 4.3 09/01/2023   CO2 22 09/01/2023   GLUCOSE 97 09/01/2023   BUN 15 09/01/2023   CREATININE 0.65 09/01/2023   BILITOT 0.5 09/01/2023   ALKPHOS 73 09/01/2023   AST 19 09/01/2023   ALT 16 09/01/2023   PROT 6.3 09/01/2023   ALBUMIN 4.3 09/01/2023   CALCIUM  9.6 09/01/2023   ANIONGAP 8 04/12/2018   EGFR 91 09/01/2023   Lab Results  Component Value Date   CHOL 138 09/01/2023   Lab Results  Component Value Date   HDL 55 09/01/2023   Lab Results  Component Value Date   LDLCALC 73 09/01/2023   Lab Results  Component Value Date   TRIG 45 09/01/2023   Lab Results  Component Value Date   CHOLHDL 2.5 09/01/2023   Lab Results  Component Value Date   HGBA1C 5.8 (H) 09/01/2023      Assessment & Plan:  Preoperative clearance Assessment & Plan: Negative urinalysis pending CBC BMP  Orders: -     CBC with  Differential/Platelet -     BMP8+EGFR -     Urinalysis   Note: This chart has been completed using Engineer, civil (consulting) software, and while attempts have been made to ensure accuracy, certain words and phrases may not be transcribed as intended.   Follow-up: No follow-ups on file.   Dorea Duff  Z Bacchus, FNP

## 2023-12-26 LAB — CBC WITH DIFFERENTIAL/PLATELET
Basophils Absolute: 0 x10E3/uL (ref 0.0–0.2)
Basos: 1 %
EOS (ABSOLUTE): 0 x10E3/uL (ref 0.0–0.4)
Eos: 1 %
Hematocrit: 38.3 % (ref 34.0–46.6)
Hemoglobin: 13.4 g/dL (ref 11.1–15.9)
Immature Grans (Abs): 0 x10E3/uL (ref 0.0–0.1)
Immature Granulocytes: 0 %
Lymphocytes Absolute: 2.2 x10E3/uL (ref 0.7–3.1)
Lymphs: 27 %
MCH: 34.1 pg — ABNORMAL HIGH (ref 26.6–33.0)
MCHC: 35 g/dL (ref 31.5–35.7)
MCV: 98 fL — ABNORMAL HIGH (ref 79–97)
Monocytes Absolute: 0.6 x10E3/uL (ref 0.1–0.9)
Monocytes: 8 %
Neutrophils Absolute: 5.3 x10E3/uL (ref 1.4–7.0)
Neutrophils: 63 %
Platelets: 357 x10E3/uL (ref 150–450)
RBC: 3.93 x10E6/uL (ref 3.77–5.28)
RDW: 11.8 % (ref 11.7–15.4)
WBC: 8.2 x10E3/uL (ref 3.4–10.8)

## 2023-12-26 LAB — BMP8+EGFR
BUN/Creatinine Ratio: 22 (ref 12–28)
BUN: 15 mg/dL (ref 8–27)
CO2: 27 mmol/L (ref 20–29)
Calcium: 10.1 mg/dL (ref 8.7–10.3)
Chloride: 95 mmol/L — ABNORMAL LOW (ref 96–106)
Creatinine, Ser: 0.68 mg/dL (ref 0.57–1.00)
Glucose: 94 mg/dL (ref 70–99)
Potassium: 3.9 mmol/L (ref 3.5–5.2)
Sodium: 138 mmol/L (ref 134–144)
eGFR: 90 mL/min/1.73 (ref >59)

## 2023-12-26 LAB — URINALYSIS
Bilirubin, UA: NEGATIVE
Glucose, UA: NEGATIVE
Ketones, UA: NEGATIVE
Leukocytes,UA: NEGATIVE
Nitrite, UA: NEGATIVE
Protein,UA: NEGATIVE
RBC, UA: NEGATIVE
Specific Gravity, UA: 1.02 (ref 1.005–1.030)
Urobilinogen, Ur: 0.2 mg/dL (ref 0.2–1.0)
pH, UA: 6.5 (ref 5.0–7.5)

## 2023-12-29 DIAGNOSIS — Z1231 Encounter for screening mammogram for malignant neoplasm of breast: Secondary | ICD-10-CM | POA: Diagnosis not present

## 2023-12-29 LAB — HM MAMMOGRAPHY

## 2023-12-31 ENCOUNTER — Ambulatory Visit: Payer: Self-pay | Admitting: Family Medicine

## 2024-01-01 ENCOUNTER — Ambulatory Visit: Admitting: Family Medicine

## 2024-01-01 ENCOUNTER — Encounter: Payer: Self-pay | Admitting: Family Medicine

## 2024-01-18 NOTE — H&P (Signed)
 TOTAL HIP ADMISSION H&P  Patient is admitted for left total hip arthroplasty.  Subjective:  Chief Complaint: Left hip pain  HPI: Sara Terry, 78 y.o. female, has a history of pain and functional disability in the left hip due to arthritis and patient has failed non-surgical conservative treatments for greater than 12 weeks to include NSAID's and/or analgesics and activity modification. Onset of symptoms was abrupt, starting a few months ago with rapidlly worsening course since that time. The patient noted no past surgery on the left hip. Patient currently rates pain in the left hip at 9 out of 10 with activity. Patient has night pain, worsening of pain with activity and weight bearing, pain that interfers with activities of daily living, and pain with passive range of motion. Patient has evidence of severe bone-on-bone arthritis with massive osteophyte formation by imaging studies. This condition presents safety issues increasing the risk of falls. There is no current active infection.  Patient Active Problem List   Diagnosis Date Noted   Preoperative clearance 12/25/2023   Impacted cerumen, bilateral 09/01/2023   Obesity (BMI 30.0-34.9) 09/06/2022   Encounter for annual general medical examination with abnormal findings in adult 02/09/2022   Bilateral lower extremity edema 08/10/2021   Parotitis, acute 06/22/2021   Prediabetes 03/11/2021   History of revision of total replacement of right hip joint 05/05/2017   OA (osteoarthritis) of knee 04/27/2016   OA (osteoarthritis) of hip 04/27/2016   New onset a-fib (HCC) 11/08/2015   Hyperlipidemia LDL goal <55 10/26/2012   Essential hypertension, benign 10/26/2012   Impaired fasting glucose 10/26/2012   Insomnia 10/26/2012    Past Medical History:  Diagnosis Date   Arthritis    right hip and right knee (had right hip replacement)   Atrial fibrillation (HCC)    Essential hypertension    GERD (gastroesophageal reflux disease)    Glucose  intolerance (impaired glucose tolerance)    Insomnia    Raynaud phenomenon    Skin cancer    Forehead    Past Surgical History:  Procedure Laterality Date   ABDOMINOPLASTY     BREAST REDUCTION SURGERY  2003   COLONOSCOPY     COLONOSCOPY N/A 06/13/2013   Procedure: COLONOSCOPY;  Surgeon: Claudis RAYMOND Rivet, MD;  Location: [facility];  Service: Endoscopy;  Laterality: N/A;  930   COSMETIC SURGERY     FOOT SURGERY Left 02/09/2016   JOINT REPLACEMENT     TONSILLECTOMY     TOTAL HIP ARTHROPLASTY Right 04/27/2016   Procedure: RIGHT TOTAL HIP ARTHROPLASTY ANTERIOR APPROACH;  Surgeon: Dempsey Moan, MD;  Location: [facility];  Service: Orthopedics;  Laterality: Right;   TUBAL LIGATION      Prior to Admission medications   Medication Sig Start Date End Date Taking? Authorizing Provider  apixaban  (ELIQUIS ) 5 MG TABS tablet Take 1 tablet (5 mg total) by mouth 2 (two) times daily. 10/23/23   Debera Jayson MATSU, MD  atorvastatin  (LIPITOR) 40 MG tablet TAKE 1 TABLET DAILY AT JEREL 07/19/23   Debera Jayson MATSU, MD  calcium  carbonate (OS-CAL - DOSED IN MG OF ELEMENTAL CALCIUM ) 1250 (500 Ca) MG tablet Take 1 tablet by mouth 2 (two) times daily with a meal.     [provider]  diltiazem  (CARDIZEM  CD) 360 MG 24 hr capsule Take 1 capsule (360 mg total) by mouth daily. 05/16/23   Debera Jayson MATSU, MD  docusate sodium  (COLACE) 100 MG capsule Take 100 mg by mouth daily.    [provider]  enalapril  (VASOTEC ) 20 MG tablet Take 1 tablet (20 mg total) by mouth daily. 06/02/23   Debera Jayson MATSU, MD  furosemide  (LASIX ) 20 MG tablet Take 1 tablet (20 mg total) by mouth as needed for edema. 06/25/21   Debera Jayson MATSU, MD  hydrochlorothiazide  (HYDRODIURIL ) 25 MG tablet Take 1 tablet (25 mg total) by mouth every morning. 06/16/23   Debera Jayson MATSU, MD  zolpidem  (AMBIEN ) 10 MG tablet Take 0.5 tablets (5 mg total) by mouth at bedtime as needed for sleep. 05/05/20   Waddell Simone HERO, DO     Allergies  Allergen Reactions   Maxzide [Triamterene-Hctz]    Penicillins Hives    Has patient had a PCN reaction causing immediate rash, facial/tongue/throat swelling, SOB or lightheadedness with hypotension: Yes Has patient had a PCN reaction causing severe rash involving mucus membranes or skin necrosis: No Has patient had a PCN reaction that required hospitalization No Has patient had a PCN reaction occurring within the last 10 years: No If all of the above answers are NO, then may proceed with Cephalosporin use.     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 [employer]  Tobacco Use   Smoking status: Former    Current packs/day: 0.00    Types: Cigarettes    Start date: 03/07/1964    Quit date: 03/07/1978    Years since quitting: 45.8   Smokeless tobacco: Never  Vaping Use   Vaping status: Never Used  Substance and Sexual Activity   Alcohol use: Yes    Alcohol/week: 21.0 standard drinks of alcohol    Types: 21 Glasses of wine per week    Comment: 2.5 glasses of wine daily   Drug use: No   Sexual activity: Never    Birth control/protection: Surgical, Post-menopausal  Other Topics Concern   Not on file  Social History Narrative   1 child at [employer] and another in [place], [place]   Social Drivers of Health   Financial Resource Strain: Low Risk  (12/21/2023)   Overall Financial Resource Strain (CARDIA)    Difficulty of Paying Living Expenses: Not hard at all  Food Insecurity: No Food Insecurity (12/21/2023)   Hunger Vital Sign    Worried About Running Out of Food in the Last Year: Never true    Ran Out of Food in the Last Year: Never true   Transportation Needs: No Transportation Needs (12/21/2023)   PRAPARE - Administrator, Civil Service (Medical): No    Lack of Transportation (Non-Medical): No  Physical Activity: Insufficiently Active (12/21/2023)   Exercise Vital Sign    Days of Exercise per Week: 2 days    Minutes of Exercise per Session: 50 min  Stress: No Stress Concern Present (12/21/2023)   [employer] of Occupational Health - Occupational Stress Questionnaire    Feeling of Stress: Not at all  Social Connections: Socially Isolated (12/21/2023)   Social Connection and Isolation Panel    Frequency of Communication with Friends and Family: Once a week    Frequency of Social Gatherings with Friends and Family: Once a week    Attends Religious Services:  More than 4 times per year    Active Member of Clubs or Organizations: No    Attends Banker Meetings: Not on file    Marital Status: Widowed  Intimate Partner Violence: Not At Risk (11/30/2022)   Humiliation, Afraid, Rape, and Kick questionnaire    Fear of Current or Ex-Partner: No    Emotionally Abused: No    Physically Abused: No    Sexually Abused: No    Tobacco Use: Medium Risk (12/25/2023)   Patient History    Smoking Tobacco Use: Former    Smokeless Tobacco Use: Never    Passive Exposure: Not on file   Social History   Substance and Sexual Activity  Alcohol Use Yes   Alcohol/week: 21.0 standard drinks of alcohol   Types: 21 Glasses of wine per week   Comment: 2.5 glasses of wine daily    Family History  Problem Relation Age of Onset   Hypertension Mother    Hypertension Father    Diabetes Father    Heart attack Father    Heart disease Father    Hypertension Brother     Review of Systems  Constitutional:  Negative for chills and fever.  HENT:  Negative for congestion, sore throat and tinnitus.   Eyes:  Negative for double vision, photophobia and pain.  Respiratory:  Negative for cough, shortness of breath  and wheezing.   Cardiovascular:  Negative for chest pain, palpitations and orthopnea.  Gastrointestinal:  Negative for heartburn, nausea and vomiting.  Genitourinary:  Negative for dysuria, frequency and urgency.  Musculoskeletal:  Positive for joint pain.  Neurological:  Negative for dizziness, weakness and headaches.     Objective:  Physical Exam: Well nourished and well developed.  General: Alert and oriented x3, cooperative and pleasant, no acute distress.  Head: normocephalic, atraumatic, neck supple.  Eyes: EOMI.  Musculoskeletal:  Left hip: Flexion 100, no internal or external rotation, abduction limited to approximately 20.  Calves soft and nontender. Motor function intact in LE. Strength 5/5 LE bilaterally. Neuro: Distal pulses 2+. Sensation to light touch intact in LE.   Imaging Review Plain radiographs demonstrate severe degenerative joint disease of the left hip. The bone quality appears to be adequate for age and reported activity level.  Assessment/Plan:  End stage arthritis, left hip  The patient history, physical examination, clinical judgement of the provider and imaging studies are consistent with end stage degenerative joint disease of the left hip and total hip arthroplasty is deemed medically necessary. The treatment options including medical management, injection therapy, arthroscopy and arthroplasty were discussed at length. The risks and benefits of total hip arthroplasty were presented and reviewed. The risks due to aseptic loosening, infection, stiffness, dislocation/subluxation, thromboembolic complications and other imponderables were discussed. The patient acknowledged the explanation, agreed to proceed with the plan and consent was signed. Patient is being admitted for inpatient treatment for surgery, pain control, PT, OT, prophylactic antibiotics, VTE prophylaxis, progressive ambulation and ADLs and discharge planning.The patient is planning to be  discharged home.   Patient's anticipated LOS is less than 2 midnights, meeting these requirements: - Younger than 20 - Lives within 1 hour of care - Has a competent adult at home to recover with post-op recover - NO history of  - Chronic pain requiring opiods  - Diabetes  - Coronary Artery Disease  - Heart failure  - Heart attack  - Stroke  - DVT/VTE  - Cardiac arrhythmia  - Respiratory Failure/COPD  - Renal failure  -  Anemia  - Advanced Liver disease  Therapy Plans: HEP Disposition: Home with daughter Planned DVT Prophylaxis: Eliquis  5 mg BID DME Needed: None PCP: Meade Gerlach, NP (clearance received) Cardiologist: Jayson Sierras, MD (telehealth call on 11/24) TXA: IV Allergies: PCN (rash) Anesthesia Concerns: None BMI: 28 Last HgbA1c: Not diabetic Pain Regimen: Hydrocodone, tramadol  [facility]  Other: - Did have nausea with last THA, per discussion with patient did not seem to be medication related. Will have low threshold for switching if needed however  - Patient was instructed on what medications to stop prior to surgery. - Follow-up visit in 2 weeks with Dr. Melodi - Begin physical therapy following surgery - Pre-operative lab work as pre-surgical testing - Prescriptions will be provided in hospital at time of discharge  Roxie Mess, PA-C Orthopedic Surgery [facility] [place]

## 2024-01-23 NOTE — Patient Instructions (Signed)
 SURGICAL WAITING ROOM VISITATION  Patients having surgery or a procedure may have no more than 2 support people in the waiting area - these visitors may rotate.    Children under the age of 47 must have an adult with them who is not the patient.  Visitors with respiratory illnesses are discouraged from visiting and should remain at home.  If the patient needs to stay at the hospital during part of their recovery, the visitor guidelines for inpatient rooms apply. Pre-op nurse will coordinate an appropriate time for 1 support person to accompany patient in pre-op.  This support person may not rotate.    Please refer to the [facility] website for the visitor guidelines for Inpatients (after your surgery is over and you are in a regular room).       Your procedure is scheduled on: 02/07/24   Report to [facility] Main Entrance    Report to admitting at 8:15 AM   Call this number if you have problems the morning of surgery [phone]   Do not eat food :After Midnight.   After Midnight you may have the following liquids until 7:45 AM DAY OF SURGERY  Water  Non-Citrus Juices (without pulp, NO RED-Apple, White grape, White cranberry) Black Coffee (NO MILK/CREAM OR CREAMERS, sugar ok)  Clear Tea (NO MILK/CREAM OR CREAMERS, sugar ok) regular and decaf                             Plain Jell-O (NO RED)                                           Fruit ices (not with fruit pulp, NO RED)                                     Popsicles (NO RED)                                                               Sports drinks like Gatorade (NO RED)                   The day of surgery:  Drink ONE (1) Pre-Surgery  G2 at 7:45 AM the morning of surgery. Drink in one sitting. Do not sip.  This drink was given to you during your hospital  pre-op appointment visit. Nothing else to drink after completing the  Pre-Surgery  G2.         Oral Hygiene is also important to reduce your risk of  infection.                                    Remember - BRUSH YOUR TEETH THE MORNING OF SURGERY WITH YOUR REGULAR TOOTHPASTE  DENTURES WILL BE REMOVED PRIOR TO SURGERY PLEASE DO NOT APPLY Poly grip OR ADHESIVES!!!   Stop all vitamins and herbal supplements 7 days before surgery.   Take these medicines the morning of surgery with A SIP OF  WATER : Atorvastatin , diltiazem   Do not take hydrochlorothiazide  the morning of surgery.             You may not have any metal on your body including hair pins, jewelry, and body piercing             Do not wear make-up, lotions, powders, perfumes/cologne, or deodorant  Do not wear nail polish including gel and S&S, artificial/acrylic nails, or any other type of covering on natural nails including finger and toenails. If you have artificial nails, gel coating, etc. that needs to be removed by a nail salon please have this removed prior to surgery or surgery may need to be canceled/ delayed if the surgeon/ anesthesia feels like they are unable to be safely monitored.   Do not shave  48 hours prior to surgery.              Do not bring valuables to the hospital. [place] IS NOT             RESPONSIBLE   FOR VALUABLES.   Contacts, glasses, dentures or bridgework may not be worn into surgery.   Bring small overnight bag day of surgery.   DO NOT BRING YOUR HOME MEDICATIONS TO THE HOSPITAL. PHARMACY WILL DISPENSE MEDICATIONS LISTED ON YOUR MEDICATION LIST TO YOU DURING YOUR ADMISSION IN THE HOSPITAL!    Patients discharged on the day of surgery will not be allowed to drive home.  Someone NEEDS to stay with you for the first 24 hours after anesthesia.   Special Instructions: Bring a copy of your healthcare power of attorney and living will documents the day of surgery if you haven't scanned them before.              Please read over the following fact sheets you were given: IF YOU HAVE QUESTIONS ABOUT YOUR PRE-OP INSTRUCTIONS PLEASE CALL [phone]  Sara Terry   If you received a COVID test during your pre-op visit  it is requested that you wear a mask when out in public, stay away from anyone that may not be feeling well and notify your surgeon if you develop symptoms. If you test positive for Covid or have been in contact with anyone that has tested positive in the last 10 days please notify you surgeon.      Pre-operative 4 CHG Bath Instructions  DYNA-Hex 4 Chlorhexidine  Gluconate 4% Solution Antiseptic 4 fl. oz   You can play a key role in reducing the risk of infection after surgery. Your skin needs to be as free of germs as possible. You can reduce the number of germs on your skin by washing with CHG (chlorhexidine  gluconate) soap before surgery. CHG is an antiseptic soap that kills germs and continues to kill germs even after washing.   DO NOT use if you have an allergy to chlorhexidine /CHG or antibacterial soaps. If your skin becomes reddened or irritated, stop using the CHG and notify one of our RNs at   Please shower with the CHG soap starting 4 days before surgery using the following schedule:     Please keep in mind the following:  DO NOT shave, including legs and underarms, starting the day of your first shower.   You may shave your face at any point before/day of surgery.  Place clean sheets on your bed the day you start using CHG soap. Use a clean washcloth (not used since being washed) for each shower. DO NOT sleep with pets once you  start using the CHG.  CHG Shower Instructions:  If you choose to wash your hair and private area, wash first with your normal shampoo/soap.  After you use shampoo/soap, rinse your hair and body thoroughly to remove shampoo/soap residue.  Turn the water  OFF and apply about 3 tablespoons (45 ml) of CHG soap to a CLEAN washcloth.  Apply CHG soap ONLY FROM YOUR NECK DOWN TO YOUR TOES (washing for 3-5 minutes)  DO NOT use CHG soap on face, private areas, open wounds, or sores.  Pay special  attention to the area where your surgery is being performed.  If you are having back surgery, having someone wash your back for you may be helpful. Wait 2 minutes after CHG soap is applied, then you may rinse off the CHG soap.  Pat dry with a clean towel  Put on clean clothes/pajamas   If you choose to wear lotion, please use ONLY the CHG-compatible lotions on the back of this paper.     Additional instructions for the day of surgery: DO NOT APPLY any lotions, deodorants, cologne, or perfumes.   Put on clean/comfortable clothes.  Brush your teeth.  Ask your nurse before applying any prescription medications to the skin.   CHG Compatible Lotions   Aveeno Moisturizing lotion  Cetaphil Moisturizing Cream  Cetaphil Moisturizing Lotion  Clairol Herbal Essence Moisturizing Lotion, Dry Skin  Clairol Herbal Essence Moisturizing Lotion, Extra Dry Skin  Clairol Herbal Essence Moisturizing Lotion, Normal Skin  Curel Age Defying Therapeutic Moisturizing Lotion with Alpha Hydroxy  Curel Extreme Care Body Lotion  Curel Soothing Hands Moisturizing Hand Lotion  Curel Therapeutic Moisturizing Cream, Fragrance-Free  Curel Therapeutic Moisturizing Lotion, Fragrance-Free  Curel Therapeutic Moisturizing Lotion, Original Formula  Eucerin Daily Replenishing Lotion  Eucerin Dry Skin Therapy Plus Alpha Hydroxy Crme  Eucerin Dry Skin Therapy Plus Alpha Hydroxy Lotion  Eucerin Original Crme  Eucerin Original Lotion  Eucerin Plus Crme Eucerin Plus Lotion  Eucerin TriLipid Replenishing Lotion  Keri Anti-Bacterial Hand Lotion  Keri Deep Conditioning Original Lotion Dry Skin Formula Softly Scented  Keri Deep Conditioning Original Lotion, Fragrance Free Sensitive Skin Formula  Keri Lotion Fast Absorbing Fragrance Free Sensitive Skin Formula  Keri Lotion Fast Absorbing Softly Scented Dry Skin Formula  Keri Original Lotion  Keri Skin Renewal Lotion Keri Silky Smooth Lotion  Keri Silky Smooth Sensitive  Skin Lotion  Nivea Body Creamy Conditioning Oil  Nivea Body Extra Enriched Lotion  Nivea Body Original Lotion  Nivea Body Sheer Moisturizing Lotion Nivea Crme  Nivea Skin Firming Lotion  NutraDerm 30 Skin Lotion  NutraDerm Skin Lotion  NutraDerm Therapeutic Skin Cream  NutraDerm Therapeutic Skin Lotion  ProShield Protective Hand Cream  Incentive Spirometer  An incentive spirometer is a tool that can help keep your lungs clear and active. This tool measures how well you are filling your lungs with each breath. Taking long deep breaths may help reverse or decrease the chance of developing breathing (pulmonary) problems (especially infection) following: A long period of time when you are unable to move or be active. BEFORE THE PROCEDURE  If the spirometer includes an indicator to show your best effort, your nurse or respiratory therapist will set it to a desired goal. If possible, sit up straight or lean slightly forward. Try not to slouch. Hold the incentive spirometer in an upright position. INSTRUCTIONS FOR USE  Sit on the edge of your bed if possible, or sit up as far as you can in bed or on a chair.  Hold the incentive spirometer in an upright position. Breathe out normally. Place the mouthpiece in your mouth and seal your lips tightly around it. Breathe in slowly and as deeply as possible, raising the piston or the ball toward the top of the column. Hold your breath for 3-5 seconds or for as long as possible. Allow the piston or ball to fall to the bottom of the column. Remove the mouthpiece from your mouth and breathe out normally. Rest for a few seconds and repeat Steps 1 through 7 at least 10 times every 1-2 hours when you are awake. Take your time and take a few normal breaths between deep breaths. The spirometer may include an indicator to show your best effort. Use the indicator as a goal to work toward during each repetition. After each set of 10 deep breaths, practice  coughing to be sure your lungs are clear. If you have an incision (the cut made at the time of surgery), support your incision when coughing by placing a pillow or rolled up towels firmly against it. Once you are able to get out of bed, walk around indoors and cough well. You may stop using the incentive spirometer when instructed by your caregiver.  RISKS AND COMPLICATIONS Take your time so you do not get dizzy or light-headed. If you are in pain, you may need to take or ask for pain medication before doing incentive spirometry. It is harder to take a deep breath if you are having pain. AFTER USE Rest and breathe slowly and easily. It can be helpful to keep track of a log of your progress. Your caregiver can provide you with a simple table to help with this. If you are using the spirometer at home, follow these instructions: SEEK MEDICAL CARE IF:  You are having difficultly using the spirometer. You have trouble using the spirometer as often as instructed. Your pain medication is not giving enough relief while using the spirometer. You develop fever of 100.5 F (38.1 C) or higher. SEEK IMMEDIATE MEDICAL CARE IF:  You cough up bloody sputum that had not been present before. You develop fever of 102 F (38.9 C) or greater. You develop worsening pain at or near the incision site. MAKE SURE YOU:  Understand these instructions. Will watch your condition. Will get help right away if you are not doing well or get worse. Document Released: 07/04/2006 Document Revised: 05/16/2011 Document Reviewed: 09/04/2006  . WHAT IS A BLOOD TRANSFUSION? Blood Transfusion Information  A transfusion is the replacement of blood or some of its parts. Blood is made up of multiple cells which provide different functions. Red blood cells carry oxygen and are used for blood loss replacement. White blood cells fight against infection. Platelets control bleeding. Plasma helps clot blood. Other blood products are  available for specialized needs, such as hemophilia or other clotting disorders. BEFORE THE TRANSFUSION  Who gives blood for transfusions?  Healthy volunteers who are fully evaluated to make sure their blood is safe. This is blood bank blood. Transfusion therapy is the safest it has ever been in the practice of medicine. Before blood is taken from a donor, a complete history is taken to make sure that person has no history of diseases nor engages in risky social behavior (examples are intravenous drug use or sexual activity with multiple partners). The donor's travel history is screened to minimize risk of transmitting infections, such as malaria. The donated blood is tested for signs of infectious diseases, such as HIV  and hepatitis. The blood is then tested to be sure it is compatible with you in order to minimize the chance of a transfusion reaction. If you or a relative donates blood, this is often done in anticipation of surgery and is not appropriate for emergency situations. It takes many days to process the donated blood. RISKS AND COMPLICATIONS Although transfusion therapy is very safe and saves many lives, the main dangers of transfusion include:  Getting an infectious disease. Developing a transfusion reaction. This is an allergic reaction to something in the blood you were given. Every precaution is taken to prevent this. The decision to have a blood transfusion has been considered carefully by your caregiver before blood is given. Blood is not given unless the benefits outweigh the risks. AFTER THE TRANSFUSION Right after receiving a blood transfusion, you will usually feel much better and more energetic. This is especially true if your red blood cells have gotten low (anemic). The transfusion raises the level of the red blood cells which carry oxygen, and this usually causes an energy increase. The nurse administering the transfusion will monitor you carefully for complications. HOME CARE  INSTRUCTIONS  No special instructions are needed after a transfusion. You may find your energy is better. Speak with your caregiver about any limitations on activity for underlying diseases you may have. SEEK MEDICAL CARE IF:  Your condition is not improving after your transfusion. You develop redness or irritation at the intravenous (IV) site. SEEK IMMEDIATE MEDICAL CARE IF:  Any of the following symptoms occur over the next 12 hours: Shaking chills. You have a temperature by mouth above 102 F (38.9 C), not controlled by medicine. Chest, back, or muscle pain. People around you feel you are not acting correctly or are confused. Shortness of breath or difficulty breathing. Dizziness and fainting. You get a rash or develop hives. You have a decrease in urine output. Your urine turns a dark color or changes to pink, red, or brown. Any of the following symptoms occur over the next 10 days: You have a temperature by mouth above 102 F (38.9 C), not controlled by medicine. Shortness of breath. Weakness after normal activity. The white part of the eye turns yellow (jaundice). You have a decrease in the amount of urine or are urinating less often. Your urine turns a dark color or changes to pink, red, or brown. Document Released: 02/19/2000 Document Revised: 05/16/2011 Document Reviewed: 10/08/2007 [facility] Patient Information 2014 [place], [place].

## 2024-01-23 NOTE — Progress Notes (Addendum)
 COVID Vaccine received:  []  No [x]  Yes Date of any COVID positive Test in last 90 days: no PCP - Meade Gerlach FNP Cardiologist - Jayson Sierras MD  Chest x-ray -  EKG -  10/23/23 Epic Stress Test -  ECHO - 07/09/21 Epic Cardiac Cath -   Bowel Prep - [x]  No  []   Yes ______  Pacemaker / ICD device [x]  No []  Yes   Spinal Cord Stimulator:[x]  No []  Yes       History of Sleep Apnea? [x]  No []  Yes   CPAP used?- [x]  No []  Yes    Does the patient monitor blood sugar?          [x]  No []  Yes  []  N/A  Patient has: [x]  NO Hx DM   []  Pre-DM                 []  DM1  []   DM2 Does patient have a [employer] or Dexacom? []  No []  Yes   Fasting Blood Sugar Ranges-  Checks Blood Sugar _____ times a day  GLP1 agonist / usual dose - no GLP1 instructions:  SGLT-2 inhibitors / usual dose - no SGLT-2 instructions:   Blood Thinner / Instructions:Eliquis -hold x3 days. Last dose to be 02/03/24 PM Aspirin  Instructions:no  Comments:   Activity level: Patient is able to climb a flight of stairs without difficulty; [x]  No CP  [x]  No SOB,    Patient can perform ADLs without assistance.   Anesthesia review: a-fib, on Eliquis   Patient denies shortness of breath, fever, cough and chest pain at PAT appointment.  Patient verbalized understanding and agreement to the Pre-Surgical Instructions that were given to them at this PAT appointment. Patient was also educated of the need to review these PAT instructions again prior to his/her surgery.I reviewed the appropriate phone numbers to call if they have any and questions or concerns.

## 2024-01-25 ENCOUNTER — Encounter (HOSPITAL_COMMUNITY)
Admission: RE | Admit: 2024-01-25 | Discharge: 2024-01-25 | Disposition: A | Discharge status: Home / Self Care | Source: Ambulatory Visit | Attending: Orthopedic Surgery | Admitting: Orthopedic Surgery

## 2024-01-25 ENCOUNTER — Other Ambulatory Visit: Payer: Self-pay

## 2024-01-25 ENCOUNTER — Encounter (HOSPITAL_COMMUNITY): Payer: Self-pay

## 2024-01-25 VITALS — BP 139/84 | HR 86 | Temp 97.7°F | Resp 16 | Ht 65.0 in | Wt 175.0 lb

## 2024-01-25 DIAGNOSIS — I7300 Raynaud's syndrome without gangrene: Secondary | ICD-10-CM | POA: Diagnosis not present

## 2024-01-25 DIAGNOSIS — I4891 Unspecified atrial fibrillation: Secondary | ICD-10-CM | POA: Insufficient documentation

## 2024-01-25 DIAGNOSIS — Z87891 Personal history of nicotine dependence: Secondary | ICD-10-CM | POA: Insufficient documentation

## 2024-01-25 DIAGNOSIS — M1612 Unilateral primary osteoarthritis, left hip: Secondary | ICD-10-CM | POA: Diagnosis not present

## 2024-01-25 DIAGNOSIS — Z01818 Encounter for other preprocedural examination: Secondary | ICD-10-CM

## 2024-01-25 DIAGNOSIS — I10 Essential (primary) hypertension: Secondary | ICD-10-CM | POA: Insufficient documentation

## 2024-01-25 DIAGNOSIS — Z7901 Long term (current) use of anticoagulants: Secondary | ICD-10-CM | POA: Diagnosis not present

## 2024-01-25 DIAGNOSIS — Z01812 Encounter for preprocedural laboratory examination: Secondary | ICD-10-CM | POA: Insufficient documentation

## 2024-01-25 HISTORY — DX: Cardiac arrhythmia, unspecified: I49.9

## 2024-01-25 LAB — SURGICAL PCR SCREEN
MRSA, PCR: NEGATIVE
Staphylococcus aureus: NEGATIVE

## 2024-01-25 LAB — CBC
HCT: 38.2 % (ref 36.0–46.0)
Hemoglobin: 12.8 g/dL (ref 12.0–15.0)
MCH: 32.8 pg (ref 26.0–34.0)
MCHC: 33.5 g/dL (ref 30.0–36.0)
MCV: 97.9 fL (ref 80.0–100.0)
Platelets: 343 K/uL (ref 150–400)
RBC: 3.9 MIL/uL (ref 3.87–5.11)
RDW: 12.3 % (ref 11.5–15.5)
WBC: 8.5 K/uL (ref 4.0–10.5)
nRBC: 0 % (ref 0.0–0.2)

## 2024-01-25 LAB — BASIC METABOLIC PANEL WITH GFR
Anion gap: 10 (ref 5–15)
BUN: 17 mg/dL (ref 8–23)
CO2: 29 mmol/L (ref 22–32)
Calcium: 9.9 mg/dL (ref 8.9–10.3)
Chloride: 97 mmol/L — ABNORMAL LOW (ref 98–111)
Creatinine, Ser: 0.73 mg/dL (ref 0.44–1.00)
GFR, Estimated: >60 mL/min (ref >60)
Glucose, Bld: 140 mg/dL — ABNORMAL HIGH (ref 70–99)
Potassium: 4 mmol/L (ref 3.5–5.1)
Sodium: 136 mmol/L (ref 135–145)

## 2024-01-25 LAB — TYPE AND SCREEN
ABO/RH(D): A POS
Antibody Screen: NEGATIVE

## 2024-01-29 ENCOUNTER — Ambulatory Visit: Attending: Cardiology | Admitting: Cardiology

## 2024-01-29 DIAGNOSIS — Z01810 Encounter for preprocedural cardiovascular examination: Secondary | ICD-10-CM

## 2024-01-29 DIAGNOSIS — Z01818 Encounter for other preprocedural examination: Secondary | ICD-10-CM

## 2024-01-29 NOTE — Progress Notes (Signed)
 Anesthesia Chart Review   Case: [id number] Date/Time: 02/07/24 1030   Procedure: ARTHROPLASTY, HIP, TOTAL, ANTERIOR APPROACH (Left: Hip)   Anesthesia type: Choice   Pre-op diagnosis: Left hip osteoarthritis   Location: [facility] ROOM 10 / [facility]   Surgeons: Melodi Lerner, MD       DISCUSSION:78 y.o. former smoker with h/o GERD, atrial fibrillation on Eliquis , Raynaud's, left hip OA scheduled for above procedure 02/07/2024 with Dr. Lerner Melodi.   Per cardiology preoperative evaluation 11/24/425, Ms. Nowack's perioperative risk of a major cardiac event is 0.4% according to the Revised Cardiac Risk Index (RCRI).  Therefore, she is at low risk for perioperative complications.   Her functional capacity is good at   METs according to the Duke Activity Status Index (DASI). Recommendations: According to ACC/AHA guidelines, no further cardiovascular testing needed.  The patient may proceed to surgery at acceptable risk.   Antiplatelet and/or Anticoagulation Recommendations:   Eliquis  (Apixaban ) can be held for 3 days prior to surgery.  Please resume post op when felt to be safe.  Pt reports last dose of Eliquis  02/03/2024 PM dose.  VS: BP 139/84   Pulse 86   Temp 36.5 C (Oral)   Resp 16   Ht 5' 5 (1.651 m)   Wt 79.4 kg   SpO2 96%   BMI 29.12 kg/m   PROVIDERS: Bacchus, Meade PEDLAR, FNP is PCP   Cardiologist - Jayson Sierras MD  LABS: Labs reviewed: Acceptable for surgery. (all labs ordered are listed, but only abnormal results are displayed)  Labs Reviewed  BASIC METABOLIC PANEL WITH GFR - Abnormal; Notable for the following components:      Result Value   Chloride 97 (*)    Glucose, Bld 140 (*)    All other components within normal limits  SURGICAL PCR SCREEN  CBC  TYPE AND SCREEN     IMAGES:   EKG:   CV: Echo 07/09/2021 1. Left ventricular ejection fraction, by estimation, is 60 to 65%. The  left ventricle has normal function. The left ventricle has no regional  wall  motion abnormalities. There is mild concentric left ventricular  hypertrophy. Left ventricular diastolic  function could not be evaluated.   2. Right ventricular systolic function is normal. The right ventricular  size is mildly enlarged. There is normal pulmonary artery systolic  pressure. The estimated right ventricular systolic pressure is 29.6 mmHg.   3. Left atrial size was mildly dilated.   4. The mitral valve is normal in structure. Trivial mitral valve  regurgitation. No evidence of mitral stenosis.   5. Tricuspid valve regurgitation is mild to moderate.   6. The aortic valve is calcified. There is severe calcifcation of the  aortic valve. There is moderate thickening of the aortic valve. Aortic  valve regurgitation is not visualized. Aortic valve  sclerosis/calcification is present, without any evidence of  aortic stenosis. Aortic valve area, by VTI measures 1.70 cm. Aortic valve  mean gradient measures 7.0 mmHg. Aortic valve Vmax measures 1.83 m/s.   7. The inferior vena cava is normal in size with greater than 50%  respiratory variability, suggesting right atrial pressure of 3 mmHg.   Past Medical History:  Diagnosis Date   Arthritis    right hip and right knee (had right hip replacement)   Atrial fibrillation (HCC)    Dysrhythmia    Essential hypertension    GERD (gastroesophageal reflux disease)    Glucose intolerance (impaired glucose tolerance)    Insomnia  Raynaud phenomenon    Skin cancer    Forehead    Past Surgical History:  Procedure Laterality Date   ABDOMINOPLASTY     BREAST REDUCTION SURGERY  2003   COLONOSCOPY     COLONOSCOPY N/A 06/13/2013   Procedure: COLONOSCOPY;  Surgeon: Claudis RAYMOND Rivet, MD;  Location[facility];  Service: Endoscopy;  Laterality: N/A;  930   COSMETIC SURGERY     FOOT SURGERY Left 02/09/2016   JOINT REPLACEMENT     TONSILLECTOMY     TOTAL HIP ARTHROPLASTY Right 04/27/2016   Procedure: RIGHT TOTAL HIP ARTHROPLASTY  ANTERIOR APPROACH;  Surgeon: Dempsey Moan, MD;  Location: [facility];  Service: Orthopedics;  Laterality: Right;   TUBAL LIGATION      MEDICATIONS:  apixaban  (ELIQUIS ) 5 MG TABS tablet   atorvastatin  (LIPITOR) 40 MG tablet   Calcium  Carb-Cholecalciferol (CALCIUM  500 + D3 PO)   diltiazem  (CARDIZEM  CD) 360 MG 24 hr capsule   docusate sodium  (COLACE) 100 MG capsule   enalapril  (VASOTEC ) 20 MG tablet   hydrochlorothiazide  (HYDRODIURIL ) 25 MG tablet   Multiple Vitamin (MULTIVITAMIN WITH MINERALS) TABS tablet   No current facility-administered medications for this encounter.   Harlene Hoots Ward, PA-C [facility] Pre-Surgical Testing [phone]

## 2024-01-29 NOTE — Anesthesia Preprocedure Evaluation (Addendum)
 Anesthesia Evaluation  Patient identified by MRN, date of birth, ID band Patient awake    Reviewed: Allergy & Precautions, NPO status , Patient's Chart, lab work & pertinent test results  Airway Mallampati: II  TM Distance: >3 FB Neck ROM: Full    Dental   Pulmonary former smoker   breath sounds clear to auscultation       Cardiovascular hypertension, Pt. on medications + dysrhythmias Atrial Fibrillation + Valvular Problems/Murmurs AS  Rhythm:Regular Rate:Normal + Systolic murmurs Echo 07/09/2021 1. Left ventricular ejection fraction, by estimation, is 60 to 65%. The left  ventricle has normal function. The left ventricle has no regional wall  motion abnormalities. There is mild concentric left ventricular hypertrophy.  Left ventricular diastolic function could not be evaluated.   2. Right ventricular systolic function is normal. The right ventricular  size is mildly enlarged. There is normal pulmonary artery systolic  pressure. The estimated right ventricular systolic pressure is 29.6 mmHg.   3. Left atrial size was mildly dilated.   4. The mitral valve is normal in structure. Trivial mitral valve regurgitation.  No evidence of mitral stenosis.   5. Tricuspid valve regurgitation is mild to moderate.   6. The aortic valve is calcified. There is severe calcifcation of the  aortic valve. There is moderate thickening of the aortic valve. Aortic  valve regurgitation is not visualized. Aortic valve sclerosis/calcification  is present, without any evidence of aortic stenosis. Aortic valve area,  by VTI measures 1.70 cm. Aortic valve mean gradient measures  7.0 mmHg. Aortic valve Vmax measures 1.83 m/s.   7. The inferior vena cava is normal in size with greater than 50%  respiratory variability, suggesting right atrial pressure of 3 mmHg.     Left ventricle: The cavity size was normal. Wall thickness was   normal. Systolic function was  normal. The estimated ejection   fraction was in the range of 60% to 65%. Wall motion was normal;   there were no regional wall motion abnormalities. The study is   not technically sufficient to allow evaluation of LV diastolic   function. - Mitral valve: Mildly calcified annulus. There was moderate   regurgitation. - Left atrium: The atrium was moderately dilated. - Right ventricle: Systolic function was mildly reduced. - Right atrium: The atrium was moderately dilated. - Tricuspid valve: There was moderate regurgitation.   Neuro/Psych negative neurological ROS     GI/Hepatic Neg liver ROS,GERD  Controlled,,  Endo/Other  Hypothyroidism    Renal/GU negative Renal ROS     Musculoskeletal  (+) Arthritis ,    Abdominal   Peds  Hematology negative hematology ROS (+)   Anesthesia Other Findings   Reproductive/Obstetrics                              Lab Results  Component Value Date   WBC 8.5 01/25/2024   HGB 12.8 01/25/2024   HCT 38.2 01/25/2024   MCV 97.9 01/25/2024   PLT 343 01/25/2024   Lab Results  Component Value Date   CREATININE 0.73 01/25/2024   BUN 17 01/25/2024   NA 136 01/25/2024   K 4.0 01/25/2024   CL 97 (L) 01/25/2024   CO2 29 01/25/2024   Lab Results  Component Value Date   INR 0.94 04/27/2016   INR 1.22 04/21/2016   INR 1.01 11/08/2015    Anesthesia Physical Anesthesia Plan  ASA: 3  Anesthesia Plan: Spinal  Post-op Pain Management:    Induction: Intravenous  PONV Risk Score and Plan: Ondansetron , Dexamethasone  and Treatment may vary due to age or medical condition  Airway Management Planned: Natural Airway and Simple Face Mask  Additional Equipment: None  Intra-op Plan:   Post-operative Plan:   Informed Consent: I have reviewed the patients History and Physical, chart, labs and discussed the procedure including the risks, benefits and alternatives for the proposed anesthesia with the patient or  authorized representative who has indicated his/her understanding and acceptance.     Dental advisory given  Plan Discussed with: CRNA  Anesthesia Plan Comments: (See PAT note 01/25/2024  Risks of anesthesia explained at length. This includes, but is not limited to, sore throat, damage to teeth, lips gums, tongue and vocal cords, nausea and vomiting, reactions to medications, stroke, heart attack, and death. All patient questions were answered and the patient wishes to proceed. Risks of spinal anesthesia explained at length. This includes, but is not limited to, bleeding, infection, reactions to the medications, seizures, headache, damage to surrounding structures, damage to nerves, permanent weakness, numbness, tingling and pain. All patient questions were answered and patient wishes to proceed with spinal anesthesia.   Discussed aortic valve sclerosis, which may have progressed to stenosis. Pt acknowledges the risks and wishes to proceedl)         Anesthesia Quick Evaluation

## 2024-01-29 NOTE — Progress Notes (Signed)
 Virtual Visit via Telephone Note   Because of Sara Terry co-morbid illnesses, she is at least at moderate risk for complications without adequate follow up.  This format is felt to be most appropriate for this patient at this time.  Due to technical limitations with video connection (technology), today's appointment will be conducted as an audio only telehealth visit, and Sara Terry verbally agreed to proceed in this manner.   All issues noted in this document were discussed and addressed.  No physical exam could be performed with this format.  Evaluation Performed:  Preoperative cardiovascular risk assessment _____________   Date:  01/29/2024   Patient ID:  Sara Terry, DOB 1945/12/02, MRN [id number] Patient Location:  Home Provider location:   Office  Primary Care Provider:  Edman Meade PEDLAR, FNP Primary Cardiologist:  Jayson Sierras, MD  Chief Complaint / Patient Profile   78 y.o. y/o female with a h/o permanent atrial fibrillation on Eliquis , hypertension, dyslipidemia, pedal edema, who is pending left total hip and presents today for telephonic preoperative cardiovascular risk assessment.  History of Present Illness    Sara Terry is a 78 y.o. female who presents via audio/video conferencing for a telehealth visit today.  Pt was last seen in cardiology clinic on 10/23/2023 by Dr. Sierras.  At that time Sara Terry was doing well overall.  The patient is now pending procedure as outlined above. Since her last visit, she has been doing well, she lives alone, independent and is able to take care of all of her own ADLs. She denies chest pain, palpitations, dyspnea, pnd, orthopnea, n, v, dizziness, syncope, edema, weight gain, or early satiety.     Past Medical History    Past Medical History:  Diagnosis Date   Arthritis    right hip and right knee (had right hip replacement)   Atrial fibrillation (HCC)    Dysrhythmia    Essential hypertension    GERD  (gastroesophageal reflux disease)    Glucose intolerance (impaired glucose tolerance)    Insomnia    Raynaud phenomenon    Skin cancer    Forehead   Past Surgical History:  Procedure Laterality Date   ABDOMINOPLASTY     BREAST REDUCTION SURGERY  2003   COLONOSCOPY     COLONOSCOPY N/A 06/13/2013   Procedure: COLONOSCOPY;  Surgeon: Claudis RAYMOND Rivet, MD;  Location: [facility];  Service: Endoscopy;  Laterality: N/A;  930   COSMETIC SURGERY     FOOT SURGERY Left 02/09/2016   JOINT REPLACEMENT     TONSILLECTOMY     TOTAL HIP ARTHROPLASTY Right 04/27/2016   Procedure: RIGHT TOTAL HIP ARTHROPLASTY ANTERIOR APPROACH;  Surgeon: Dempsey Moan, MD;  Location: [facility];  Service: Orthopedics;  Laterality: Right;   TUBAL LIGATION      Allergies  Allergies  Allergen Reactions   Maxzide [Triamterene-Hctz]     unknown   Penicillins Hives and Rash    Home Medications    Prior to Admission medications   Medication Sig Start Date End Date Taking? Authorizing Provider  apixaban  (ELIQUIS ) 5 MG TABS tablet Take 1 tablet (5 mg total) by mouth 2 (two) times daily. 10/23/23   Sierras Jayson MATSU, MD  atorvastatin  (LIPITOR) 40 MG tablet TAKE 1 TABLET DAILY AT JEREL 07/19/23   Sierras Jayson MATSU, MD  Calcium  Carb-Cholecalciferol (CALCIUM  500 + D3 PO) Take 1 tablet by mouth in the morning and at bedtime.    [provider]  diltiazem  (  CARDIZEM  CD) 360 MG 24 hr capsule Take 1 capsule (360 mg total) by mouth daily. 05/16/23   Debera Jayson MATSU, MD  docusate sodium  (COLACE) 100 MG capsule Take 100 mg by mouth in the morning.    [provider]  enalapril  (VASOTEC ) 20 MG tablet Take 1 tablet (20 mg total) by mouth daily. Patient taking differently: Take 20 mg by mouth every evening. 06/02/23   Debera Jayson MATSU, MD  hydrochlorothiazide  (HYDRODIURIL ) 25 MG tablet Take 1 tablet (25 mg total) by mouth every morning. 06/16/23   Debera Jayson MATSU, MD  Multiple Vitamin (MULTIVITAMIN WITH MINERALS)  TABS tablet Take 1 tablet by mouth in the morning. Women's Multivitamin    [provider]    Physical Exam    Vital Signs:  Sara Terry does not have vital signs available for review today.  Given telephonic nature of communication, physical exam is limited. AAOx3. NAD. Normal affect.  Speech and respirations are unlabored.  Accessory Clinical Findings    None  Assessment & Plan    1.  Preoperative Cardiovascular Risk Assessment:    [name]'s perioperative risk of a major cardiac event is 0.4% according to the Revised Cardiac Risk Index (RCRI).  Therefore, she is at low risk for perioperative complications.   Her functional capacity is good at   METs according to the Duke Activity Status Index (DASI). Recommendations: According to ACC/AHA guidelines, no further cardiovascular testing needed.  The patient may proceed to surgery at acceptable risk.   Antiplatelet and/or Anticoagulation Recommendations:  Eliquis  (Apixaban ) can be held for 3 days prior to surgery.  Please resume post op when felt to be safe.      The patient was advised that if she develops new symptoms prior to surgery to contact our office to arrange for a follow-up visit, and she verbalized understanding.  (Reminder: Include SBE prophylaxis/Antiplatelet/Anticoag Instructions)  A copy of this note will be routed to requesting surgeon.  Time:   Today, I have spent 10 minutes with the patient with telehealth technology discussing medical history, symptoms, and management plan.     Delon JAYSON Hoover, NP  01/29/2024, 7:47 AM

## 2024-02-07 ENCOUNTER — Ambulatory Visit (HOSPITAL_COMMUNITY): Payer: Self-pay | Admitting: Anesthesiology

## 2024-02-07 ENCOUNTER — Encounter (HOSPITAL_COMMUNITY): Payer: Self-pay | Admitting: Orthopedic Surgery

## 2024-02-07 ENCOUNTER — Observation Stay (HOSPITAL_COMMUNITY)

## 2024-02-07 ENCOUNTER — Ambulatory Visit (HOSPITAL_COMMUNITY): Payer: Self-pay | Admitting: Physician Assistant

## 2024-02-07 ENCOUNTER — Observation Stay (HOSPITAL_COMMUNITY)
Admission: RE | Admit: 2024-02-07 | Discharge: 2024-02-08 | Disposition: A | Source: Ambulatory Visit | Attending: Orthopedic Surgery | Admitting: Orthopedic Surgery

## 2024-02-07 ENCOUNTER — Encounter (HOSPITAL_COMMUNITY): Admission: RE | Disposition: A | Payer: Self-pay | Source: Ambulatory Visit | Attending: Orthopedic Surgery

## 2024-02-07 ENCOUNTER — Other Ambulatory Visit: Payer: Self-pay

## 2024-02-07 ENCOUNTER — Ambulatory Visit (HOSPITAL_COMMUNITY)

## 2024-02-07 DIAGNOSIS — Z87891 Personal history of nicotine dependence: Secondary | ICD-10-CM | POA: Insufficient documentation

## 2024-02-07 DIAGNOSIS — E039 Hypothyroidism, unspecified: Secondary | ICD-10-CM | POA: Insufficient documentation

## 2024-02-07 DIAGNOSIS — M1612 Unilateral primary osteoarthritis, left hip: Secondary | ICD-10-CM

## 2024-02-07 DIAGNOSIS — I4891 Unspecified atrial fibrillation: Secondary | ICD-10-CM

## 2024-02-07 DIAGNOSIS — I10 Essential (primary) hypertension: Secondary | ICD-10-CM | POA: Insufficient documentation

## 2024-02-07 DIAGNOSIS — Z96643 Presence of artificial hip joint, bilateral: Secondary | ICD-10-CM | POA: Diagnosis not present

## 2024-02-07 DIAGNOSIS — M169 Osteoarthritis of hip, unspecified: Principal | ICD-10-CM | POA: Diagnosis present

## 2024-02-07 DIAGNOSIS — Z471 Aftercare following joint replacement surgery: Secondary | ICD-10-CM | POA: Diagnosis not present

## 2024-02-07 DIAGNOSIS — Z79899 Other long term (current) drug therapy: Secondary | ICD-10-CM | POA: Insufficient documentation

## 2024-02-07 HISTORY — PX: TOTAL HIP ARTHROPLASTY: SHX124

## 2024-02-07 SURGERY — ARTHROPLASTY, HIP, TOTAL, ANTERIOR APPROACH
Anesthesia: Spinal | Site: Hip | Laterality: Left

## 2024-02-07 MED ORDER — ONDANSETRON HCL 4 MG/2ML IJ SOLN
INTRAMUSCULAR | Status: DC | PRN
  Administered 2024-02-07: 4 mg via INTRAVENOUS

## 2024-02-07 MED ORDER — ACETAMINOPHEN 325 MG PO TABS: 325.0000 mg | ORAL_TABLET | Freq: Four times a day (QID) | ORAL | Status: DC | PRN

## 2024-02-07 MED ORDER — HYDROCHLOROTHIAZIDE 25 MG PO TABS
25.0000 mg | ORAL_TABLET | Freq: Every morning | ORAL | Status: DC
  Administered 2024-02-08: 25 mg via ORAL
  Filled 2024-02-07: qty 1

## 2024-02-07 MED ORDER — LACTATED RINGERS IV SOLN: INTRAVENOUS | Status: DC

## 2024-02-07 MED ORDER — SODIUM CHLORIDE 0.9 % IV SOLN: INTRAVENOUS | Status: DC

## 2024-02-07 MED ORDER — FENTANYL CITRATE (PF) 100 MCG/2ML IJ SOLN
INTRAMUSCULAR | Status: DC | PRN
  Administered 2024-02-07 (×2): 25 ug via INTRAVENOUS

## 2024-02-07 MED ORDER — ATORVASTATIN CALCIUM 40 MG PO TABS
40.0000 mg | ORAL_TABLET | Freq: Every day | ORAL | Status: DC
  Administered 2024-02-08: 40 mg via ORAL
  Filled 2024-02-07: qty 1

## 2024-02-07 MED ORDER — DEXAMETHASONE SOD PHOSPHATE PF 10 MG/ML IJ SOLN
10.0000 mg | Freq: Once | INTRAMUSCULAR | Status: AC
  Administered 2024-02-08: 10 mg via INTRAVENOUS

## 2024-02-07 MED ORDER — FENTANYL CITRATE (PF) 100 MCG/2ML IJ SOLN
INTRAMUSCULAR | Status: AC
  Filled 2024-02-07: qty 2

## 2024-02-07 MED ORDER — TRAMADOL HCL 50 MG PO TABS
50.0000 mg | ORAL_TABLET | Freq: Four times a day (QID) | ORAL | Status: DC | PRN
  Administered 2024-02-07 (×2): 100 mg via ORAL
  Filled 2024-02-07 (×2): qty 2

## 2024-02-07 MED ORDER — HYDROCODONE-ACETAMINOPHEN 5-325 MG PO TABS
1.0000 | ORAL_TABLET | ORAL | Status: DC | PRN
  Administered 2024-02-07 – 2024-02-08 (×3): 2 via ORAL
  Filled 2024-02-07 (×4): qty 2

## 2024-02-07 MED ORDER — PHENYLEPHRINE HCL-NACL 20-0.9 MG/250ML-% IV SOLN
INTRAVENOUS | Status: DC | PRN
  Administered 2024-02-07: 15 ug/min via INTRAVENOUS

## 2024-02-07 MED ORDER — WATER FOR IRRIGATION, STERILE IR SOLN
Status: DC | PRN
  Administered 2024-02-07: 2000 mL

## 2024-02-07 MED ORDER — BUPIVACAINE-EPINEPHRINE (PF) 0.25% -1:200000 IJ SOLN
INTRAMUSCULAR | Status: AC
  Filled 2024-02-07: qty 30

## 2024-02-07 MED ORDER — METOCLOPRAMIDE HCL 5 MG/ML IJ SOLN: 5.0000 mg | Freq: Three times a day (TID) | INTRAMUSCULAR | Status: DC | PRN

## 2024-02-07 MED ORDER — BUPIVACAINE IN DEXTROSE 0.75-8.25 % IT SOLN
INTRATHECAL | Status: DC | PRN
  Administered 2024-02-07: 1.5 mL via INTRATHECAL

## 2024-02-07 MED ORDER — ACETAMINOPHEN 10 MG/ML IV SOLN
1000.0000 mg | Freq: Four times a day (QID) | INTRAVENOUS | Status: DC
  Administered 2024-02-07: 1000 mg via INTRAVENOUS
  Filled 2024-02-07: qty 100

## 2024-02-07 MED ORDER — CHLORHEXIDINE GLUCONATE 0.12 % MT SOLN
15.0000 mL | Freq: Once | OROMUCOSAL | Status: AC
  Administered 2024-02-07: 15 mL via OROMUCOSAL

## 2024-02-07 MED ORDER — APIXABAN 2.5 MG PO TABS
2.5000 mg | ORAL_TABLET | Freq: Two times a day (BID) | ORAL | Status: DC
  Administered 2024-02-08: 2.5 mg via ORAL
  Filled 2024-02-07: qty 1

## 2024-02-07 MED ORDER — MAGNESIUM CITRATE PO SOLN: 1.0000 | Freq: Once | ORAL | Status: DC | PRN

## 2024-02-07 MED ORDER — TRANEXAMIC ACID-NACL 1000-0.7 MG/100ML-% IV SOLN
1000.0000 mg | INTRAVENOUS | Status: AC
  Administered 2024-02-07: 1000 mg via INTRAVENOUS
  Filled 2024-02-07: qty 100

## 2024-02-07 MED ORDER — ORAL CARE MOUTH RINSE: 15.0000 mL | Freq: Once | OROMUCOSAL | Status: AC

## 2024-02-07 MED ORDER — BISACODYL 10 MG RE SUPP: 10.0000 mg | Freq: Every day | RECTAL | Status: DC | PRN

## 2024-02-07 MED ORDER — CEFAZOLIN SODIUM-DEXTROSE 2-4 GM/100ML-% IV SOLN
2.0000 g | Freq: Four times a day (QID) | INTRAVENOUS | Status: AC
  Administered 2024-02-07 – 2024-02-08 (×2): 2 g via INTRAVENOUS
  Filled 2024-02-07 (×2): qty 100

## 2024-02-07 MED ORDER — METHOCARBAMOL 1000 MG/10ML IJ SOLN: 500.0000 mg | Freq: Four times a day (QID) | INTRAMUSCULAR | Status: DC | PRN

## 2024-02-07 MED ORDER — DEXAMETHASONE SOD PHOSPHATE PF 10 MG/ML IJ SOLN
8.0000 mg | Freq: Once | INTRAMUSCULAR | Status: AC
  Administered 2024-02-07: 10 mg via INTRAVENOUS

## 2024-02-07 MED ORDER — PROPOFOL 500 MG/50ML IV EMUL
INTRAVENOUS | Status: DC | PRN
  Administered 2024-02-07: 20 mg via INTRAVENOUS
  Administered 2024-02-07: 30 mg via INTRAVENOUS
  Administered 2024-02-07: 25 ug/kg/min via INTRAVENOUS

## 2024-02-07 MED ORDER — ONDANSETRON HCL 4 MG/2ML IJ SOLN: 4.0000 mg | Freq: Four times a day (QID) | INTRAMUSCULAR | Status: DC | PRN

## 2024-02-07 MED ORDER — METHOCARBAMOL 500 MG PO TABS
500.0000 mg | ORAL_TABLET | Freq: Four times a day (QID) | ORAL | Status: DC | PRN
  Administered 2024-02-07: 500 mg via ORAL
  Filled 2024-02-07: qty 1

## 2024-02-07 MED ORDER — METOCLOPRAMIDE HCL 5 MG PO TABS: 5.0000 mg | ORAL_TABLET | Freq: Three times a day (TID) | ORAL | Status: DC | PRN

## 2024-02-07 MED ORDER — BUPIVACAINE-EPINEPHRINE (PF) 0.25% -1:200000 IJ SOLN
INTRAMUSCULAR | Status: DC | PRN
  Administered 2024-02-07: 30 mL

## 2024-02-07 MED ORDER — POVIDONE-IODINE 10 % EX SWAB: 2.0000 | Freq: Once | CUTANEOUS | Status: DC

## 2024-02-07 MED ORDER — MENTHOL 3 MG MT LOZG: 1.0000 | LOZENGE | OROMUCOSAL | Status: DC | PRN

## 2024-02-07 MED ORDER — PHENYLEPHRINE 80 MCG/ML (10ML) SYRINGE FOR IV PUSH (FOR BLOOD PRESSURE SUPPORT)
PREFILLED_SYRINGE | INTRAVENOUS | Status: DC | PRN
  Administered 2024-02-07 (×2): 80 ug via INTRAVENOUS
  Administered 2024-02-07: 160 ug via INTRAVENOUS

## 2024-02-07 MED ORDER — DOCUSATE SODIUM 100 MG PO CAPS
100.0000 mg | ORAL_CAPSULE | Freq: Two times a day (BID) | ORAL | Status: DC
  Administered 2024-02-07 – 2024-02-08 (×2): 100 mg via ORAL
  Filled 2024-02-07 (×2): qty 1

## 2024-02-07 MED ORDER — CEFAZOLIN SODIUM-DEXTROSE 2-4 GM/100ML-% IV SOLN
2.0000 g | INTRAVENOUS | Status: AC
  Administered 2024-02-07: 2 g via INTRAVENOUS
  Filled 2024-02-07: qty 100

## 2024-02-07 MED ORDER — 0.9 % SODIUM CHLORIDE (POUR BTL) OPTIME
TOPICAL | Status: DC | PRN
  Administered 2024-02-07: 1000 mL

## 2024-02-07 MED ORDER — DILTIAZEM HCL ER COATED BEADS 180 MG PO CP24
360.0000 mg | ORAL_CAPSULE | Freq: Every day | ORAL | Status: DC
  Administered 2024-02-08: 360 mg via ORAL
  Filled 2024-02-07: qty 2

## 2024-02-07 MED ORDER — MORPHINE SULFATE (PF) 2 MG/ML IV SOLN: 0.5000 mg | INTRAVENOUS | Status: DC | PRN

## 2024-02-07 MED ORDER — PHENOL 1.4 % MT LIQD: 1.0000 | OROMUCOSAL | Status: DC | PRN

## 2024-02-07 MED ORDER — POLYETHYLENE GLYCOL 3350 17 G PO PACK: 17.0000 g | PACK | Freq: Every day | ORAL | Status: DC | PRN

## 2024-02-07 MED ORDER — ONDANSETRON HCL 4 MG PO TABS: 4.0000 mg | ORAL_TABLET | Freq: Four times a day (QID) | ORAL | Status: DC | PRN

## 2024-02-07 SURGICAL SUPPLY — 37 items
BAG COUNTER SPONGE SURGICOUNT (BAG) IMPLANT
BAG ZIPLOCK 12X15 (MISCELLANEOUS) IMPLANT
BLADE SAG 18X100X1.27 (BLADE) ×1 IMPLANT
COVER PERINEAL POST (MISCELLANEOUS) ×1 IMPLANT
COVER SURGICAL LIGHT HANDLE (MISCELLANEOUS) ×1 IMPLANT
CUP ACET PINNACLE SECTR 50MM (Hips) IMPLANT
DERMABOND ADVANCED .7 DNX12 (GAUZE/BANDAGES/DRESSINGS) ×1 IMPLANT
DRAPE FOOT SWITCH (DRAPES) ×1 IMPLANT
DRAPE STERI IOBAN 125X83 (DRAPES) ×1 IMPLANT
DRAPE U-SHAPE 47X51 STRL (DRAPES) ×2 IMPLANT
DRSG AQUACEL AG ADV 3.5X10 (GAUZE/BANDAGES/DRESSINGS) ×1 IMPLANT
DURAPREP 26ML APPLICATOR (WOUND CARE) ×1 IMPLANT
ELECT REM PT RETURN 15FT ADLT (MISCELLANEOUS) ×1 IMPLANT
GLOVE BIO SURGEON STRL SZ 6.5 (GLOVE) IMPLANT
GLOVE BIO SURGEON STRL SZ7 (GLOVE) IMPLANT
GLOVE BIO SURGEON STRL SZ8 (GLOVE) ×1 IMPLANT
GLOVE BIOGEL PI IND STRL 7.0 (GLOVE) IMPLANT
GLOVE BIOGEL PI IND STRL 8 (GLOVE) ×1 IMPLANT
GOWN STRL REUS W/ TWL LRG LVL3 (GOWN DISPOSABLE) ×2 IMPLANT
HEAD FEM STD 32X+1 STRL (Hips) IMPLANT
HOLDER FOLEY CATH W/STRAP (MISCELLANEOUS) ×1 IMPLANT
KIT TURNOVER KIT A (KITS) ×1 IMPLANT
LINER ACET PNNCL PLUS4 NEUTRAL (Hips) IMPLANT
MANIFOLD NEPTUNE II (INSTRUMENTS) ×1 IMPLANT
PACK ANTERIOR HIP CUSTOM (KITS) ×1 IMPLANT
PENCIL SMOKE EVACUATOR COATED (MISCELLANEOUS) ×1 IMPLANT
SPIKE FLUID TRANSFER (MISCELLANEOUS) ×1 IMPLANT
STEM FEM ACTIS STD SZ4 (Stem) IMPLANT
SUT ETHIBOND NAB CT1 #1 30IN (SUTURE) ×1 IMPLANT
SUT MNCRL AB 4-0 PS2 18 (SUTURE) ×1 IMPLANT
SUT STRATAFIX SPIRL PDS+ 45CM (SUTURE) IMPLANT
SUT VIC AB 2-0 CT1 TAPERPNT 27 (SUTURE) ×2 IMPLANT
SUTURE STRATFX 0 PDS 27 VIOLET (SUTURE) ×1 IMPLANT
TOWEL GREEN STERILE FF (TOWEL DISPOSABLE) ×1 IMPLANT
TRAY FOLEY MTR SLVR 14FR STAT (SET/KITS/TRAYS/PACK) IMPLANT
TRAY FOLEY MTR SLVR 16FR STAT (SET/KITS/TRAYS/PACK) ×1 IMPLANT
TUBE SUCTION HIGH CAP CLEAR NV (SUCTIONS) ×1 IMPLANT

## 2024-02-07 NOTE — Transfer of Care (Signed)
 Immediate Anesthesia Transfer of Care Note  Patient: Sara Terry  Procedure(s) Performed: ARTHROPLASTY, HIP, TOTAL, ANTERIOR APPROACH (Left: Hip)  Patient Location: PACU  Anesthesia Type:Spinal  Level of Consciousness: awake, oriented, and patient cooperative  Airway & Oxygen Therapy: Patient Spontanous Breathing and Patient connected to face mask oxygen  Post-op Assessment: Report given to RN and Post -op Vital signs reviewed and stable  Post vital signs: Reviewed and stable  Last Vitals:  Vitals Value Taken Time  BP 94/64 02/07/24 12:19  Temp    Pulse 73 02/07/24 12:21  Resp 14 02/07/24 12:21  SpO2 100 % 02/07/24 12:21  Vitals shown include unfiled device data.  Last Pain:  Vitals:   02/07/24 0839  TempSrc: Oral         Complications: No notable events documented.

## 2024-02-07 NOTE — Anesthesia Procedure Notes (Addendum)
 Spinal  Patient location during procedure: OR Start time: 02/07/2024 10:57 AM End time: 02/07/2024 11:03 AM Reason for block: surgical anesthesia Staffing Performed: anesthesiologist  Anesthesiologist: Darlyn Rush, MD Performed by: Darlyn Rush, MD Authorized by: Darlyn Rush, MD   Preanesthetic Checklist Completed: patient identified, IV checked, site marked, risks and benefits discussed, surgical consent, monitors and equipment checked, pre-op evaluation and timeout performed Spinal Block Patient position: sitting Prep: DuraPrep and site prepped and draped Patient monitoring: heart rate, continuous pulse ox and blood pressure Approach: right paramedian Location: L3-4 Injection technique: single-shot Needle Needle type: Spinocan  Needle gauge: 25 G Needle length: 9 cm Additional Notes Expiration date of kit checked and confirmed. Patient tolerated procedure well, without complications.

## 2024-02-07 NOTE — Evaluation (Signed)
 Physical Therapy Evaluation Patient Details Name: Sara Terry MRN: [id number] DOB: [birth year] Today's Date: 02/07/2024  History of Present Illness  78 yo female presents to therapy s/p L THA, anterior approach on 02/07/2024 due to failure of conservative measures. Pt PMH includes but is not limited to: B LE edema, R THA (2018) and revision (2019), HTN, HLD, OA, A-fib, GERD, and raynauds.  Clinical Impression  Sara Terry is a 78 y.o. female POD 0 s/p L THA. Patient reports mod I with mobility at baseline. Patient is now limited by functional impairments (see PT problem list below) and requires min A for bed mobility and CGA for transfers. Patient was able to ambulate 20 feet with RW and CGA level of assist. Patient instructed in exercise to facilitate ROM and circulation to manage edema.Patient will benefit from continued skilled PT interventions to address impairments and progress towards PLOF. Acute PT will follow to progress mobility and stair training in preparation for safe discharge home with daughter and HEP.       If plan is discharge home, recommend the following: A little help with walking and/or transfers;A little help with bathing/dressing/bathroom;Assistance with cooking/housework;Assist for transportation;Help with stairs or ramp for entrance   Can travel by private vehicle        Equipment Recommendations None recommended by PT  Recommendations for Other Services       Functional Status Assessment Patient has had a recent decline in their functional status and demonstrates the ability to make significant improvements in function in a reasonable and predictable amount of time.     Precautions / Restrictions Precautions Precautions: Fall Restrictions Weight Bearing Restrictions Per Provider Order: No LLE Weight Bearing Per Provider Order: Weight bearing as tolerated      Mobility  Bed Mobility Overal bed mobility: Needs Assistance Bed Mobility: Supine to Sit      Supine to sit: Min assist, HOB elevated, Used rails     General bed mobility comments: cues, increased time and pt reporting increased pain    Transfers Overall transfer level: Needs assistance Equipment used: Rolling walker (2 wheels) Transfers: Sit to/from Stand Sit to Stand: Contact guard assist           General transfer comment: min cues    Ambulation/Gait Ambulation/Gait assistance: Contact guard assist Gait Distance (Feet): 20 Feet Assistive device: Rolling walker (2 wheels) Gait Pattern/deviations: Step-to pattern, Decreased stance time - left, Antalgic, Trunk flexed Gait velocity: decreased     General Gait Details: slight trunk flexion with B UE support at RW to offload L LE in stance phase, min cues for safety  and RW mangement  Stairs            Wheelchair Mobility     Tilt Bed    Modified Rankin (Stroke Patients Only)       Balance Overall balance assessment: Needs assistance Sitting-balance support: Feet supported Sitting balance-Leahy Scale: Good     Standing balance support: Bilateral upper extremity supported, During functional activity, Reliant on assistive device for balance Standing balance-Leahy Scale: Poor                               Pertinent Vitals/Pain Pain Assessment Pain Assessment: 0-10 Pain Score: 10-Worst pain ever Pain Location: L hip and LE Pain Descriptors / Indicators: Aching, Burning, Constant, Discomfort, Grimacing, Operative site guarding Pain Intervention(s): Limited activity within patient's tolerance, Monitored during session, Repositioned, Premedicated before session,  Patient requesting pain meds-RN notified, Ice applied    Home Living Family/patient expects to be discharged to:: Private residence Living Arrangements: Alone Available Help at Discharge: Family Type of Home: House Home Access: Stairs to enter Entrance Stairs-Rails: None Entrance Stairs-Number of Steps: 2   Home Layout: One  level;Laundry or work area in [employer] Equipment: Agricultural Consultant (2 wheels);Cane - single point      Prior Function Prior Level of Function : Independent/Modified Independent;Working/employed;Driving             Mobility Comments: Mod I with SPC for all ADLs, self care tasks and IADLs       Extremity/Trunk Assessment        Lower Extremity Assessment Lower Extremity Assessment: LLE deficits/detail LLE Deficits / Details: ankle DF/PF 5/5 LLE Sensation: WNL    Cervical / Trunk Assessment Cervical / Trunk Assessment: Normal  Communication   Communication Communication: No apparent difficulties    Cognition Arousal: Alert Behavior During Therapy: WFL for tasks assessed/performed   PT - Cognitive impairments: No apparent impairments                         Following commands: Intact       Cueing       General Comments      Exercises Total Joint Exercises Ankle Circles/Pumps: AROM, Both, 10 reps   Assessment/Plan    PT Assessment Patient needs continued PT services  PT Problem List Decreased strength;Decreased range of motion;Decreased balance;Decreased activity tolerance;Decreased mobility;Decreased coordination;Pain       PT Treatment Interventions DME instruction;Gait training;Stair training;Functional mobility training;Therapeutic activities;Therapeutic exercise;Balance training;Neuromuscular re-education;Patient/family education;Modalities    PT Goals (Current goals can be found in the Care Plan section)  Acute Rehab PT Goals Patient Stated Goal: to be able to return to work, walk and no AD or pain PT Goal Formulation: With patient Time For Goal Achievement: 02/21/24 Potential to Achieve Goals: Good    Frequency 7X/week     Co-evaluation               AM-PAC PT 6 Clicks Mobility  Outcome Measure Help needed turning from your back to your side while in a flat bed without using bedrails?: None Help needed moving from  lying on your back to sitting on the side of a flat bed without using bedrails?: A Little Help needed moving to and from a bed to a chair (including a wheelchair)?: A Little Help needed standing up from a chair using your arms (e.g., wheelchair or bedside chair)?: A Little Help needed to walk in hospital room?: A Little Help needed climbing 3-5 steps with a railing? : A Lot 6 Click Score: 18    End of Session Equipment Utilized During Treatment: Gait belt Activity Tolerance: Patient limited by pain Patient left: in chair;with call bell/phone within reach Nurse Communication: Mobility status;Patient requests pain meds PT Visit Diagnosis: Unsteadiness on feet (R26.81);Other abnormalities of gait and mobility (R26.89);Muscle weakness (generalized) (M62.81);Difficulty in walking, not elsewhere classified (R26.2);Pain Pain - Right/Left: Left Pain - part of body: Hip;Leg    Time: 1711-1739 PT Time Calculation (min) (ACUTE ONLY): 28 min   Charges:   PT Evaluation $PT Eval Low Complexity: 1 Low PT Treatments $Gait Training: 8-22 mins PT General Charges $$ ACUTE PT VISIT: 1 Visit         [place], PT Acute Rehab   [place] VEAR Drone 02/07/2024, 6:42 PM

## 2024-02-07 NOTE — Op Note (Signed)
 OPERATIVE REPORT- TOTAL HIP ARTHROPLASTY   PREOPERATIVE DIAGNOSIS: Osteoarthritis of the Left hip.   POSTOPERATIVE DIAGNOSIS: Osteoarthritis of the Left  hip.   PROCEDURE: Left total hip arthroplasty, anterior approach.   SURGEON: Dempsey Moan, MD   ASSISTANT: Roxie Mess, PA-C  ANESTHESIA:  Spinal  ESTIMATED BLOOD LOSS:-150 mL    DRAINS: None  COMPLICATIONS: None   CONDITION: PACU - hemodynamically stable.   BRIEF CLINICAL NOTE: Sara Terry is a 78 y.o. female who has advanced end-  stage arthritis of their Left  hip with progressively worsening pain and  dysfunction.The patient has failed nonoperative management and presents for  total hip arthroplasty.   PROCEDURE IN DETAIL: After successful administration of spinal  anesthetic, the traction boots for the [facility] bed were placed on both  feet and the patient was placed onto the [facility] bed, boots placed into the leg  holders. The Left hip was then isolated from the perineum with plastic  drapes and prepped and draped in the usual sterile fashion. ASIS and  greater trochanter were marked and a oblique incision was made, starting  at about 1 cm lateral and 2 cm distal to the ASIS and coursing towards  the anterior cortex of the femur. The skin was cut with a 10 blade  through subcutaneous tissue to the level of the fascia overlying the  tensor fascia lata muscle. The fascia was then incised in line with the  incision at the junction of the anterior third and posterior 2/3rd. The  muscle was teased off the fascia and then the interval between the TFL  and the rectus was developed. The Hohmann retractor was then placed at  the top of the femoral neck over the capsule. The vessels overlying the  capsule were cauterized and the fat on top of the capsule was removed.  A Hohmann retractor was then placed anterior underneath the rectus  femoris to give exposure to the entire anterior capsule. A T-shaped  capsulotomy  was performed. The edges were tagged and the femoral head  was identified.       Osteophytes are removed off the superior acetabulum.  The femoral neck was then cut in situ with an oscillating saw. Traction  was then applied to the left lower extremity utilizing the [facility]  traction. The femoral head was then removed. Retractors were placed  around the acetabulum and then circumferential removal of the labrum was  performed. Osteophytes were also removed. Reaming starts at 47 mm to  medialize and  Increased in 2 mm increments to 49 mm. We reamed in  approximately 40 degrees of abduction, 20 degrees anteversion. A 50 mm  pinnacle acetabular shell was then impacted in anatomic position under  fluoroscopic guidance with excellent purchase. We did not need to place  any additional dome screws. A 32 mm neutral + 4 Altrrx liner was then  placed into the acetabular shell.       The femoral lift was then placed along the lateral aspect of the femur  just distal to the vastus ridge. The leg was  externally rotated and capsule  was stripped off the inferior aspect of the femoral neck down to the  level of the lesser trochanter, this was done with electrocautery. The femur was lifted after this was performed. The  leg was then placed in an extended and adducted position essentially delivering the femur. We also removed the capsule superiorly and the piriformis from the piriformis fossa to  gain excellent exposure of the  proximal femur. Rongeur was used to remove some cancellous bone to get  into the lateral portion of the proximal femur for placement of the  initial starter reamer. The starter broaches was placed  the starter broach  and was shown to go down the center of the canal. Broaching  with the Actis system was then performed starting at size 0  coursing  Up to size 4. A size 4 had excellent torsional and rotational  and axial stability. The trial standard offset neck was then placed  with a 32 +  1 trial head. The hip was then reduced. We confirmed that  the stem was in the canal both on AP and lateral x-rays. It also has excellent sizing. The hip was reduced with outstanding stability through full extension and full external rotation.. AP pelvis was taken and the leg lengths were measured and found to be equal. Hip was then dislocated again and the femoral head and neck removed. The  femoral broach was removed. Size 4 Actis stem with a standard offset  neck was then impacted into the femur following native anteversion. Has  excellent purchase in the canal. Excellent torsional and rotational and  axial stability. It is confirmed to be in the canal on AP and lateral  fluoroscopic views. The 32 + 1  metal head was placed and the hip  reduced with outstanding stability. Again AP pelvis was taken and it  confirmed that the leg lengths were equal. The wound was then copiously  irrigated with saline solution and the capsule reattached and repaired  with Ethibond suture. 30 ml of .25% Bupivicaine was  injected into the capsule and into the edge of the tensor fascia lata as well as subcutaneous tissue. The fascia overlying the tensor fascia lata was then closed with a running #1 V-Loc. Subcu was closed with interrupted 2-0 Vicryl and subcuticular running 4-0 Monocryl. Incision was cleaned  and dried. Steri-Strips and a bulky sterile dressing applied. The patient was awakened and transported to  recovery in stable condition.        Please note that a surgical assistant was a medical necessity for this procedure to perform it in a safe and expeditious manner. Assistant was necessary to provide appropriate retraction of vital neurovascular structures and to prevent femoral fracture and allow for anatomic placement of the prosthesis.  Dempsey Moan, M.D.

## 2024-02-07 NOTE — Discharge Instructions (Signed)
°Sara Aluisio, MD °Total Joint Specialist °[facility] °[address] °[place], [place] [address] °[phone] ° °ANTERIOR APPROACH TOTAL HIP REPLACEMENT POSTOPERATIVE DIRECTIONS ° ° ° ° °Hip Rehabilitation, Guidelines Following Surgery  °The results of a hip operation are greatly improved after range of motion and muscle strengthening exercises. Follow all safety measures which are given to protect your hip. If any of these exercises cause increased pain or swelling in your joint, decrease the amount until you are comfortable again. Then slowly increase the exercises. Call your caregiver if you have problems or questions.  ° °HOME CARE INSTRUCTIONS  °Remove items at home which could result in a fall. This includes throw rugs or furniture in walking pathways.  °ICE to the affected hip as frequently as 20-30 minutes an hour and then as needed for pain and swelling. Continue to use ice on the hip for pain and swelling from surgery. You may notice swelling that will progress down to the foot and ankle. This is normal after surgery. Elevate the leg when you are not up walking on it.   °Continue to use the breathing machine which will help keep your temperature down.  It is common for your temperature to cycle up and down following surgery, especially at night when you are not up moving around and exerting yourself.  The breathing machine keeps your lungs expanded and your temperature down. ° °DIET °You may resume your previous home diet once your are discharged from the hospital. ° °DRESSING / WOUND CARE / SHOWERING °You have an adhesive waterproof bandage over the incision. Leave this in place until your first follow-up appointment. Once you remove this you will not need to place another bandage.  °You may begin showering 3 days following surgery, but do not submerge the incision under water. ° °ACTIVITY °For the first 3-5 days, it is important to rest and keep the operative leg elevated.  You should, as a general rule, rest for 50 minutes and walk/stretch for 10 minutes per hour. After 5 days, you may slowly increase activity as tolerated.  °Perform the exercises you were provided twice a day for about 15-20 minutes each session. Begin these 2 days following surgery. °Walk with your walker as instructed. Use the walker until you are comfortable transitioning to a cane. Walk with the cane in the opposite hand of the operative leg. You may discontinue the cane once you are comfortable and walking steadily. °Avoid periods of inactivity such as sitting longer than an hour when not asleep. This helps prevent blood clots.  °Do not drive a car for 6 weeks or until released by your surgeon.  °Do not drive while taking narcotics. ° °TED HOSE STOCKINGS °Wear the elastic stockings on both legs for three weeks following surgery during the day. You may remove them at night while sleeping. ° °WEIGHT BEARING °Weight bearing as tolerated with assist device (walker, cane, etc) as directed, use it as long as suggested by your surgeon or therapist, typically at least 4-6 weeks. ° °POSTOPERATIVE CONSTIPATION PROTOCOL °Constipation - defined medically as fewer than three stools per week and severe constipation as less than one stool per week. ° °One of the most common issues patients have following surgery is constipation.  Even if you have a regular bowel pattern at home, your normal regimen is likely to be disrupted due to multiple reasons following surgery.  Combination of anesthesia, postoperative narcotics, change in appetite and fluid intake all can affect your bowels.    In order to avoid complications following surgery, here are some recommendations in order to help you during your recovery period. ° °Colace (docusate) - Pick up an over-the-counter form of Colace or another stool softener and take twice a day as long as you are requiring postoperative pain medications.  Take with a full glass of water daily.  If  you experience loose stools or diarrhea, hold the colace until you stool forms back up.  If your symptoms do not get better within 1 week or if they get worse, check with your doctor. °Dulcolax (bisacodyl) - Pick up over-the-counter and take as directed by the product packaging as needed to assist with the movement of your bowels.  Take with a full glass of water.  Use this product as needed if not relieved by Colace only.  °MiraLax (polyethylene glycol) - Pick up over-the-counter to have on hand.  MiraLax is a solution that will increase the amount of water in your bowels to assist with bowel movements.  Take as directed and can mix with a glass of water, juice, soda, coffee, or tea.  Take if you go more than two days without a movement.Do not use MiraLax more than once per day. Call your doctor if you are still constipated or irregular after using this medication for 7 days in a row. ° °If you continue to have problems with postoperative constipation, please contact the office for further assistance and recommendations.  If you experience "the worst abdominal pain ever" or develop nausea or vomiting, please contact the office immediatly for further recommendations for treatment. ° °ITCHING ° If you experience itching with your medications, try taking only a single pain pill, or even half a pain pill at a time.  You can also use Benadryl over the counter for itching or also to help with sleep.  ° °MEDICATIONS °See your medication summary on the “After Visit Summary” that the nursing staff will review with you prior to discharge.  You may have some home medications which will be placed on hold until you complete the course of blood thinner medication.  It is important for you to complete the blood thinner medication as prescribed by your surgeon.  Continue your approved medications as instructed at time of discharge. ° °PRECAUTIONS °If you experience chest pain or shortness of breath - call 911 immediately for  transfer to the hospital emergency department.  °If you develop a fever greater that 101 F, purulent drainage from wound, increased redness or drainage from wound, foul odor from the wound/dressing, or calf pain - CONTACT YOUR SURGEON.   °                                                °FOLLOW-UP APPOINTMENTS °Make sure you keep all of your appointments after your operation with your surgeon and caregivers. You should call the office at the above phone number and make an appointment for approximately two weeks after the date of your surgery or on the date instructed by your surgeon outlined in the "After Visit Summary". ° °RANGE OF MOTION AND STRENGTHENING EXERCISES  °These exercises are designed to help you keep full movement of your hip joint. Follow your caregiver's or physical therapist's instructions. Perform all exercises about fifteen times, three times per day or as directed. Exercise both hips, even if you have had only   one joint replacement. These exercises can be done on a training (exercise) mat, on the floor, on a table or on a bed. Use whatever works the best and is most comfortable for you. Use music or television while you are exercising so that the exercises are a pleasant break in your day. This will make your life better with the exercises acting as a break in routine you can look forward to.  °Lying on your back, slowly slide your foot toward your buttocks, raising your knee up off the floor. Then slowly slide your foot back down until your leg is straight again.  °Lying on your back spread your legs as far apart as you can without causing discomfort.  °Lying on your side, raise your upper leg and foot straight up from the floor as far as is comfortable. Slowly lower the leg and repeat.  °Lying on your back, tighten up the muscle in the front of your thigh (quadriceps muscles). You can do this by keeping your leg straight and trying to raise your heel off the floor. This helps strengthen the  largest muscle supporting your knee.  °Lying on your back, tighten up the muscles of your buttocks both with the legs straight and with the knee bent at a comfortable angle while keeping your heel on the floor.  ° °POST-OPERATIVE OPIOID TAPER INSTRUCTIONS: °It is important to wean off of your opioid medication as soon as possible. If you do not need pain medication after your surgery it is ok to stop day one. °Opioids include: °Codeine, Hydrocodone(Norco, Vicodin), Oxycodone(Percocet, oxycontin) and hydromorphone amongst others.  °Long term and even short term use of opiods can cause: °Increased pain response °Dependence °Constipation °Depression °Respiratory depression °And more.  °Withdrawal symptoms can include °Flu like symptoms °Nausea, vomiting °And more °Techniques to manage these symptoms °Hydrate well °Eat regular healthy meals °Stay active °Use relaxation techniques(deep breathing, meditating, yoga) °Do Not substitute Alcohol to help with tapering °If you have been on opioids for less than two weeks and do not have pain than it is ok to stop all together.  °Plan to wean off of opioids °This plan should start within one week post op of your joint replacement. °Maintain the same interval or time between taking each dose and first decrease the dose.  °Cut the total daily intake of opioids by one tablet each day °Next start to increase the time between doses. °The last dose that should be eliminated is the evening dose.  ° °IF YOU ARE TRANSFERRED TO A SKILLED REHAB FACILITY °If the patient is transferred to a skilled rehab facility following release from the hospital, a list of the current medications will be sent to the facility for the patient to continue.  When discharged from the skilled rehab facility, please have the facility set up the patient's Home Health Physical Therapy prior to being released. Also, the skilled facility will be responsible for providing the patient with their medications at time of  release from the facility to include their pain medication, the muscle relaxants, and their blood thinner medication. If the patient is still at the rehab facility at time of the two week follow up appointment, the skilled rehab facility will also need to assist the patient in arranging follow up appointment in our office and any transportation needs. ° °MAKE SURE YOU:  °Understand these instructions.  °Get help right away if you are not doing well or get worse.  ° ° °DENTAL ANTIBIOTICS: ° °In most   cases prophylactic antibiotics for Dental procdeures after total joint surgery are not necessary. ° °Exceptions are as follows: ° °1. History of prior total joint infection ° °2. Severely immunocompromised (Organ Transplant, cancer chemotherapy, Rheumatoid biologic °meds such as Humera) ° °3. Poorly controlled diabetes (A1C &gt; 8.0, blood glucose over 200) ° °If you have one of these conditions, contact your surgeon for an antibiotic prescription, prior to your °dental procedure.  ° ° °Pick up stool softner and laxative for home use following surgery while on pain medications. °Do not submerge incision under water. °Please use good hand washing techniques while changing dressing each day. °May shower starting three days after surgery. °Please use a clean towel to pat the incision dry following showers. °Continue to use ice for pain and swelling after surgery. °Do not use any lotions or creams on the incision until instructed by your surgeon. ° °

## 2024-02-07 NOTE — Care Plan (Signed)
 Ortho Bundle Case Management Note  Patient Details  Name: Sara Terry MRN: [id number] Date of Birth: 24-Aug-1945  L THA on 02/07/24  DCP: Home with daughter   DME: No needs  PT: HEP                   DME Arranged:  N/A DME Agency:  NA  HH Arranged:    HH Agency:     Additional Comments: Please contact me with any questions of if this plan should need to change.  Lyle Pepper, CCM [facility] [phone]  Ext. [phone]   02/07/2024, 8:20 AM

## 2024-02-07 NOTE — Interval H&P Note (Signed)
 History and Physical Interval Note:  02/07/2024 9:46 AM  Sara Terry  has presented today for surgery, with the diagnosis of Left hip osteoarthritis.  The various methods of treatment have been discussed with the patient and family. After consideration of risks, benefits and other options for treatment, the patient has consented to  Procedure(s): ARTHROPLASTY, HIP, TOTAL, ANTERIOR APPROACH (Left) as a surgical intervention.  The patient's history has been reviewed, patient examined, no change in status, stable for surgery.  I have reviewed the patient's chart and labs.  Questions were answered to the patient's satisfaction.     Dempsey Ansel Ferrall

## 2024-02-07 NOTE — Anesthesia Postprocedure Evaluation (Signed)
 Anesthesia Post Note  Patient: Sara Terry  Procedure(s) Performed: ARTHROPLASTY, HIP, TOTAL, ANTERIOR APPROACH (Left: Hip)     Patient location during evaluation: PACU Anesthesia Type: Spinal Level of consciousness: awake and alert Pain management: pain level controlled Vital Signs Assessment: post-procedure vital signs reviewed and stable Respiratory status: spontaneous breathing Cardiovascular status: stable Anesthetic complications: no   No notable events documented.  Last Vitals:  Vitals:   02/07/24 1430 02/07/24 1509  BP: 137/88 (!) 153/79  Pulse: 83 89  Resp: 14 16  Temp:  36.5 C  SpO2: 93% 98%    Last Pain:  Vitals:   02/07/24 1542  TempSrc:   PainSc: 4                  [name]

## 2024-02-08 ENCOUNTER — Encounter (HOSPITAL_COMMUNITY): Payer: Self-pay | Admitting: Orthopedic Surgery

## 2024-02-08 ENCOUNTER — Other Ambulatory Visit (HOSPITAL_BASED_OUTPATIENT_CLINIC_OR_DEPARTMENT_OTHER): Payer: Self-pay

## 2024-02-08 ENCOUNTER — Other Ambulatory Visit (HOSPITAL_COMMUNITY): Payer: Self-pay

## 2024-02-08 DIAGNOSIS — M1612 Unilateral primary osteoarthritis, left hip: Secondary | ICD-10-CM | POA: Diagnosis not present

## 2024-02-08 LAB — BASIC METABOLIC PANEL WITH GFR
Anion gap: 9 (ref 5–15)
BUN: 14 mg/dL (ref 8–23)
CO2: 26 mmol/L (ref 22–32)
Calcium: 9 mg/dL (ref 8.9–10.3)
Chloride: 99 mmol/L (ref 98–111)
Creatinine, Ser: 0.45 mg/dL (ref 0.44–1.00)
GFR, Estimated: >60 mL/min (ref >60)
Glucose, Bld: 166 mg/dL — ABNORMAL HIGH (ref 70–99)
Potassium: 4 mmol/L (ref 3.5–5.1)
Sodium: 134 mmol/L — ABNORMAL LOW (ref 135–145)

## 2024-02-08 LAB — CBC
HCT: 33.8 % — ABNORMAL LOW (ref 36.0–46.0)
Hemoglobin: 11.4 g/dL — ABNORMAL LOW (ref 12.0–15.0)
MCH: 32.5 pg (ref 26.0–34.0)
MCHC: 33.7 g/dL (ref 30.0–36.0)
MCV: 96.3 fL (ref 80.0–100.0)
Platelets: 250 K/uL (ref 150–400)
RBC: 3.51 MIL/uL — ABNORMAL LOW (ref 3.87–5.11)
RDW: 12.1 % (ref 11.5–15.5)
WBC: 11.4 K/uL — ABNORMAL HIGH (ref 4.0–10.5)
nRBC: 0 % (ref 0.0–0.2)

## 2024-02-08 MED ORDER — TRAMADOL HCL 50 MG PO TABS
50.0000 mg | ORAL_TABLET | Freq: Four times a day (QID) | ORAL | 0 refills | Status: AC | PRN
  Filled 2024-02-08: qty 40, 5d supply, fill #0

## 2024-02-08 MED ORDER — ONDANSETRON HCL 4 MG PO TABS
4.0000 mg | ORAL_TABLET | Freq: Four times a day (QID) | ORAL | 0 refills | Status: AC | PRN
  Filled 2024-02-08: qty 20, 5d supply, fill #0

## 2024-02-08 MED ORDER — METHOCARBAMOL 500 MG PO TABS
500.0000 mg | ORAL_TABLET | Freq: Four times a day (QID) | ORAL | 0 refills | Status: AC | PRN
  Filled 2024-02-08: qty 40, 10d supply, fill #0

## 2024-02-08 MED ORDER — HYDROCODONE-ACETAMINOPHEN 5-325 MG PO TABS
1.0000 | ORAL_TABLET | ORAL | 0 refills | Status: AC | PRN
  Filled 2024-02-08: qty 42, 7d supply, fill #0

## 2024-02-08 NOTE — Progress Notes (Signed)
 Discharge medications delivered to patient at the bedside.

## 2024-02-08 NOTE — TOC Transition Note (Signed)
 Transition of Care [facility]) - Discharge Note   Patient Details  Name: Sara Terry MRN: [id number] Date of Birth: 1945/12/01  [facility]) CM/SW Contact:  Alfonse JONELLE Rex, RN Phone Number: 02/08/2024, 2:09 PM   Clinical Narrative:   Patient admitted 12/3 for scheduled procedure, dc therapy HEP, has RW. MOON completed. No INPT CM needs.     Final next level of care: Home/Self Care Barriers to Discharge: No Barriers Identified   Patient Goals and CMS Choice Patient states their goals for this hospitalization and ongoing recovery are:: return home          Discharge Placement                       Discharge Plan and Services Additional resources added to the After Visit Summary for                  DME Arranged: N/A DME Agency: NA                  Social Drivers of Health (SDOH) Interventions SDOH Screenings   Food Insecurity: No Food Insecurity (02/07/2024)  Housing: Low Risk  (02/07/2024)  Transportation Needs: No Transportation Needs (02/07/2024)  Utilities: Not At Risk (02/07/2024)  Alcohol Screen: Low Risk  (12/21/2023)  Depression (PHQ2-9): Low Risk  (12/25/2023)  Financial Resource Strain: Low Risk  (12/21/2023)  Physical Activity: Insufficiently Active (12/21/2023)  Social Connections: Socially Isolated (02/07/2024)  Stress: No Stress Concern Present (12/21/2023)  Tobacco Use: Medium Risk (02/07/2024)  Health Literacy: Adequate Health Literacy (11/30/2022)     Readmission Risk Interventions     No data to display

## 2024-02-08 NOTE — Progress Notes (Signed)
Discharge package printed and instructions given to pt. Pt verbalizes understanding. 

## 2024-02-08 NOTE — Progress Notes (Signed)
   Subjective: 1 Day Post-Op Procedure(s) (LRB): ARTHROPLASTY, HIP, TOTAL, ANTERIOR APPROACH (Left) Patient reports pain as mild.   Patient seen in rounds by Dr. Melodi. Patient is well, and has had no acute complaints or problems. Denies chest pain or SOB. No issues overnight. Foley catheter removed this AM. We will continue therapy today  Objective: Vital signs in last 24 hours: Temp:  [97.3 F (36.3 C)-98.2 F (36.8 C)] 98.2 F (36.8 C) (12/04 0513) Pulse Rate:  [72-94] 90 (12/04 0513) Resp:  [12-17] 16 (12/04 0513) BP: (94-181)/(63-101) 133/82 (12/04 0513) SpO2:  [93 %-100 %] 98 % (12/04 0513) Weight:  [79.4 kg] 79.4 kg (12/03 0838)  Intake/Output from previous day:  Intake/Output Summary (Last 24 hours) at 02/08/2024 0752 Last data filed at 02/08/2024 0616 Gross per 24 hour  Intake 3065.35 ml  Output 2350 ml  Net 715.35 ml     Intake/Output this shift: No intake/output data recorded.  Labs: Recent Labs    02/08/24 0355  HGB 11.4*   Recent Labs    02/08/24 0355  WBC 11.4*  RBC 3.51*  HCT 33.8*  PLT 250   Recent Labs    02/08/24 0355  NA 134*  K 4.0  CL 99  CO2 26  BUN 14  CREATININE 0.45  GLUCOSE 166*  CALCIUM  9.0   No results for input(s): LABPT, INR in the last 72 hours.  Exam: General - Patient is Alert and Oriented Extremity - Neurologically intact Neurovascular intact Sensation intact distally Intact pulses distally Dressing - dressing C/D/I Motor Function - intact, moving foot and toes well on exam.   Past Medical History:  Diagnosis Date   Arthritis    right hip and right knee (had right hip replacement)   Atrial fibrillation (HCC)    Dysrhythmia    Essential hypertension    GERD (gastroesophageal reflux disease)    Glucose intolerance (impaired glucose tolerance)    Insomnia    Raynaud phenomenon    Skin cancer    Forehead    Assessment/Plan: 1 Day Post-Op Procedure(s) (LRB): ARTHROPLASTY, HIP, TOTAL, ANTERIOR  APPROACH (Left) Principal Problem:   OA (osteoarthritis) of hip Active Problems:   Primary osteoarthritis of left hip  Estimated body mass index is 29.12 kg/m as calculated from the following:   Height as of this encounter: 5' 5 (1.651 m).   Weight as of this encounter: 79.4 kg. Advance diet Up with therapy D/C IV fluids  DVT Prophylaxis - Eliquis  Weight bearing as tolerated. Continue therapy.  Plan is to go Home after hospital stay. Plan for discharge with HEP later today if progresses with therapy and meeting goals. Follow-up in the office in 2 weeks.  The PDMP database was reviewed today prior to any opioid medications being prescribed to this patient.  Roxie Mess, PA-C Orthopedic Surgery [phone] 02/08/2024, 7:52 AM

## 2024-02-08 NOTE — Progress Notes (Signed)
 Physical Therapy Treatment Patient Details Name: Sara Terry MRN: [id number] DOB: 11/23/1945 Today's Date: 02/08/2024   History of Present Illness 78 yo female presents to therapy s/p L THA, anterior approach on 02/07/2024 due to failure of conservative measures. Pt PMH includes but is not limited to: B LE edema, R THA (2018) and revision (2019), HTN, HLD, OA, A-fib, GERD, and raynauds.    PT Comments  The patient  mobilized and ambulated to BR, when returning, patient reported feeling dizzy and nauseous. Recliner brought up. BP 153/86.  PT continued  with some nausea and dizziness. Patient remained in recliner. PT will return this PM and ambulate and practice steps as tolerated.   If plan is discharge home, recommend the following: A little help with walking and/or transfers;A little help with bathing/dressing/bathroom;Assistance with cooking/housework;Assist for transportation;Help with stairs or ramp for entrance   Can travel by private vehicle        Equipment Recommendations  None recommended by PT    Recommendations for Other Services       Precautions / Restrictions Precautions Precautions: Fall Restrictions LLE Weight Bearing Per Provider Order: Weight bearing as tolerated     Mobility  Bed Mobility Overal bed mobility: Needs Assistance Bed Mobility: Supine to Sit     Supine to sit: Min assist, HOB elevated, Used rails     General bed mobility comments: cues, increased time and pt reporting increased pain, assist the LLE    Transfers Overall transfer level: Needs assistance   Transfers: Sit to/from Stand Sit to Stand: Contact guard assist           General transfer comment: cues for hand and LLE    Ambulation/Gait Ambulation/Gait assistance: Contact guard assist, Min assist Gait Distance (Feet): 20 Feet (then 10, became dizzy) Assistive device: Rolling walker (2 wheels) Gait Pattern/deviations: Step-to pattern, Decreased stance time - left, Antalgic,  Trunk flexed Gait velocity: decreased     General Gait Details: patient am,bukated to BR with Rw with CGA, after toileting , dizziness, recliner brought up   Stairs             Wheelchair Mobility     Tilt Bed    Modified Rankin (Stroke Patients Only)       Balance Overall balance assessment: Needs assistance Sitting-balance support: Feet supported Sitting balance-Leahy Scale: Good     Standing balance support: Bilateral upper extremity supported, During functional activity, Reliant on assistive device for balance Standing balance-Leahy Scale: Poor                              Communication Communication Communication: No apparent difficulties  Cognition Arousal: Alert Behavior During Therapy: WFL for tasks assessed/performed   PT - Cognitive impairments: No apparent impairments                         Following commands: Intact      Cueing    Exercises Total Joint Exercises Ankle Circles/Pumps: AROM, Both, 10 reps Quad Sets: AROM, Both, 10 reps Heel Slides: AAROM, Left, 10 reps Hip ABduction/ADduction: AAROM, Left, 5 reps    General Comments        Pertinent Vitals/Pain Pain Assessment Pain Location: L hip and LE Pain Descriptors / Indicators: Aching, Discomfort, Grimacing, Operative site guarding Pain Intervention(s): Premedicated before session, Repositioned    Home Living  Prior Function            PT Goals (current goals can now be found in the care plan section) Progress towards PT goals: Progressing toward goals    Frequency    7X/week      PT Plan      Co-evaluation              AM-PAC PT 6 Clicks Mobility   Outcome Measure  Help needed turning from your back to your side while in a flat bed without using bedrails?: None Help needed moving from lying on your back to sitting on the side of a flat bed without using bedrails?: A Little Help needed moving to  and from a bed to a chair (including a wheelchair)?: A Little Help needed standing up from a chair using your arms (e.g., wheelchair or bedside chair)?: A Little Help needed to walk in hospital room?: A Little Help needed climbing 3-5 steps with a railing? : A Little 6 Click Score: 19    End of Session Equipment Utilized During Treatment: Gait belt Activity Tolerance: Treatment limited secondary to medical complications (Comment) Patient left: in chair;with call bell/phone within reach;with chair alarm set Nurse Communication: Mobility status (dizziness) PT Visit Diagnosis: Unsteadiness on feet (R26.81);Other abnormalities of gait and mobility (R26.89);Muscle weakness (generalized) (M62.81);Difficulty in walking, not elsewhere classified (R26.2);Pain Pain - Right/Left: Left     Time: 8971-8946 PT Time Calculation (min) (ACUTE ONLY): 25 min  Charges:    $Gait Training: 8-22 mins $Therapeutic Exercise: 8-22 mins PT General Charges $$ ACUTE PT VISIT: 1 Visit                     Darice Potters PT Acute Rehabilitation Services Office [phone]    Potters Darice Norris 02/08/2024, 12:46 PM

## 2024-02-08 NOTE — Progress Notes (Signed)
 Physical Therapy Treatment Patient Details Name: Sara Terry MRN: [id number] DOB: 03-Nov-1945 Today's Date: 02/08/2024   History of Present Illness 78 yo female presents to therapy s/p L THA, anterior approach on 02/07/2024 due to failure of conservative measures. Pt PMH includes but is not limited to: B LE edema, R THA (2018) and revision (2019), HTN, HLD, OA, A-fib, GERD, and raynauds.    PT Comments  The patient reports feeling  a little nausea. Patient able to complete step training with Rw and no rails. Patient wants to Dc and has met PT goals for DC. Patient's daughter present.    If plan is discharge home, recommend the following: A little help with walking and/or transfers;A little help with bathing/dressing/bathroom;Assistance with cooking/housework;Assist for transportation;Help with stairs or ramp for entrance   Can travel by private vehicle        Equipment Recommendations  None recommended by PT    Recommendations for Other Services       Precautions / Restrictions Precautions Precautions: Fall Restrictions LLE Weight Bearing Per Provider Order: Weight bearing as tolerated     Mobility  Bed Mobility Overal bed mobility: Needs Assistance           General bed mobility comments: in recliner    Transfers Overall transfer level: Needs assistance Equipment used: Rolling walker (2 wheels) Transfers: Sit to/from Stand Sit to Stand: Contact guard assist           General transfer comment: cues for hand and LLE    Ambulation/Gait Ambulation/Gait assistance: Contact guard assist Gait Distance (Feet): 50 Feet Assistive device: Rolling walker (2 wheels) Gait Pattern/deviations: Step-to pattern, Step-through pattern Gait velocity: decreased     General Gait Details: reports very mild nausea   Stairs Stairs: Yes Stairs assistance: Min assist Stair Management: Forwards, Backwards   General stair comments: practiced 2 steps backwards with RW and  3  steps forward with RWon shallow steps   Wheelchair Mobility     Tilt Bed    Modified Rankin (Stroke Patients Only)       Balance Overall balance assessment: Needs assistance Sitting-balance support: Feet supported Sitting balance-Leahy Scale: Good     Standing balance support: Bilateral upper extremity supported, During functional activity, Reliant on assistive device for balance Standing balance-Leahy Scale: Fair                              Hotel Manager: No apparent difficulties  Cognition Arousal: Alert Behavior During Therapy: WFL for tasks assessed/performed   PT - Cognitive impairments: No apparent impairments                         Following commands: Intact      Cueing    Exercises Total Joint Exercises Ankle Circles/Pumps: AROM, Both, 10 reps Quad Sets: AROM, Both, 10 reps Heel Slides: AAROM, Left, 10 reps Hip ABduction/ADduction: AAROM, Left, 5 reps    General Comments        Pertinent Vitals/Pain Pain Assessment Pain Score: 7  Pain Location: L hip and LE Pain Descriptors / Indicators: Aching, Discomfort, Grimacing, Operative site guarding Pain Intervention(s): Patient requesting pain meds-RN notified, Ice applied    Home Living                          Prior Function  PT Goals (current goals can now be found in the care plan section) Progress towards PT goals: Progressing toward goals    Frequency    7X/week      PT Plan      Co-evaluation              AM-PAC PT 6 Clicks Mobility   Outcome Measure  Help needed turning from your back to your side while in a flat bed without using bedrails?: None Help needed moving from lying on your back to sitting on the side of a flat bed without using bedrails?: A Little Help needed moving to and from a bed to a chair (including a wheelchair)?: A Little Help needed standing up from a chair using your arms  (e.g., wheelchair or bedside chair)?: A Little Help needed to walk in hospital room?: A Little Help needed climbing 3-5 steps with a railing? : A Little 6 Click Score: 19    End of Session Equipment Utilized During Treatment: Gait belt Activity Tolerance: Treatment limited secondary to medical complications (Comment) Patient left: in chair;with call bell/phone within reach;with chair alarm set;with family/visitor present Nurse Communication: Mobility status PT Visit Diagnosis: Unsteadiness on feet (R26.81);Other abnormalities of gait and mobility (R26.89);Muscle weakness (generalized) (M62.81);Difficulty in walking, not elsewhere classified (R26.2);Pain Pain - Right/Left: Left Pain - part of body: Hip;Leg     Time: 1330-1350 PT Time Calculation (min) (ACUTE ONLY): 20 min  Charges:    $Gait Training: 8-22 mins $Therapeutic Exercise: 8-22 mins PT General Charges $$ ACUTE PT VISIT: 1 Visit                     Darice Potters PT Acute Rehabilitation Services Office [phone]   Potters Darice Norris 02/08/2024, 2:48 PM

## 2024-02-08 NOTE — Plan of Care (Signed)
  Problem: Activity: Goal: Risk for activity intolerance will decrease Outcome: Progressing   Problem: Safety: Goal: Ability to remain free from injury will improve Outcome: Progressing   Problem: Pain Managment: Goal: General experience of comfort will improve and/or be controlled Outcome: Progressing

## 2024-02-08 NOTE — Care Management Obs Status (Signed)
 MEDICARE OBSERVATION STATUS NOTIFICATION   Patient Details  Name: ALYANNAH SANKS MRN: [id number] Date of Birth: 1945/06/12   Medicare Observation Status Notification Given:  Yes    Alfonse JONELLE Rex, RN 02/08/2024, 1:54 PM

## 2024-02-12 NOTE — Discharge Summary (Signed)
 Patient ID: Sara Terry MRN: [id number] DOB/AGE: Apr 02, 1945 78 y.o.  Admit date: 02/07/2024 Discharge date: 02/08/2024  Admission Diagnoses:  Principal Problem:   OA (osteoarthritis) of hip Active Problems:   Primary osteoarthritis of left hip   Discharge Diagnoses:  Same  Past Medical History:  Diagnosis Date   Arthritis    right hip and right knee (had right hip replacement)   Atrial fibrillation (HCC)    Dysrhythmia    Essential hypertension    GERD (gastroesophageal reflux disease)    Glucose intolerance (impaired glucose tolerance)    Insomnia    Raynaud phenomenon    Skin cancer    Forehead    Surgeries: Procedure(s): ARTHROPLASTY, HIP, TOTAL, ANTERIOR APPROACH on 02/07/2024   Consultants:   Discharged Condition: Improved  Hospital Course: Sara Terry is an 78 y.o. female who was admitted 02/07/2024 for operative treatment ofOA (osteoarthritis) of hip. Patient has severe unremitting pain that affects sleep, daily activities, and work/hobbies. After pre-op clearance the patient was taken to the operating room on 02/07/2024 and underwent  Procedure(s): ARTHROPLASTY, HIP, TOTAL, ANTERIOR APPROACH.    Patient was given perioperative antibiotics:  Anti-infectives (From admission, onward)    Start     Dose/Rate Route Frequency Ordered Stop   02/07/24 1800  ceFAZolin  (ANCEF ) IVPB 2g/100 mL premix        2 g 200 mL/hr over 30 Minutes Intravenous Every 6 hours 02/07/24 1458 02/08/24 0117   02/07/24 0845  ceFAZolin  (ANCEF ) IVPB 2g/100 mL premix        2 g 200 mL/hr over 30 Minutes Intravenous On call to O.R. 02/07/24 9161 02/07/24 1114        Patient was given sequential compression devices, early ambulation, and chemoprophylaxis to prevent DVT.  Patient benefited maximally from hospital stay and there were no complications.    Recent vital signs: No data found.   Recent laboratory studies: No results for input(s): WBC, HGB, HCT, PLT, NA, K, CL,  CO2, BUN, CREATININE, GLUCOSE, INR, CALCIUM  in the last 72 hours.  Invalid input(s): PT, 2   Discharge Medications:   Allergies as of 02/08/2024       Reactions   Maxzide [triamterene-hctz]    unknown   Penicillins Hives, Rash   Tolerated Cephalosporin Date: 02/08/24.        Medication List     TAKE these medications    apixaban  5 MG Tabs tablet Commonly known as: Eliquis  Take 1 tablet (5 mg total) by mouth 2 (two) times daily.   atorvastatin  40 MG tablet Commonly known as: LIPITOR TAKE 1 TABLET DAILY AT 6PM   CALCIUM  500 + D3 PO Take 1 tablet by mouth in the morning and at bedtime.   diltiazem  360 MG 24 hr capsule Commonly known as: CARDIZEM  CD Take 1 capsule (360 mg total) by mouth daily.   docusate sodium  100 MG capsule Commonly known as: COLACE Take 100 mg by mouth in the morning.   enalapril  20 MG tablet Commonly known as: VASOTEC  Take 1 tablet (20 mg total) by mouth daily. What changed: when to take this   hydrochlorothiazide  25 MG tablet Commonly known as: HYDRODIURIL  Take 1 tablet (25 mg total) by mouth every morning.   HYDROcodone -acetaminophen  5-325 MG tablet Commonly known as: NORCO/VICODIN Take 1 tablet by mouth every 4 (four) hours as needed for severe pain (pain score 7-10).   methocarbamol  500 MG tablet Commonly known as: ROBAXIN  Take 1 tablet (500 mg total) by mouth every 6 (  six) hours as needed for muscle spasms.   multivitamin with minerals Tabs tablet Take 1 tablet by mouth in the morning. Women's Multivitamin   ondansetron  4 MG tablet Commonly known as: ZOFRAN  Take 1 tablet (4 mg total) by mouth every 6 (six) hours as needed for nausea.   traMADol  50 MG tablet Commonly known as: ULTRAM  Take 1-2 tablets (50-100 mg total) by mouth every 6 (six) hours as needed for moderate pain (pain score 4-6).               Discharge Care Instructions  (From admission, onward)           Start     Ordered   02/08/24  0000  Weight bearing as tolerated        02/08/24 0753   02/08/24 0000  Change dressing       Comments: You have an adhesive waterproof bandage over the incision. Leave this in place until your first follow-up appointment. Once you remove this you will not need to place another bandage.   02/08/24 0753            Diagnostic Studies: DG Pelvis Portable Result Date: 02/07/2024 CLINICAL DATA:  Status post left hip replacement. EXAM: PORTABLE PELVIS 1-2 VIEWS COMPARISON:  None Available. FINDINGS: Left hip arthroplasty in expected alignment. No periprosthetic lucency or fracture. Recent postsurgical change includes air and edema in the soft tissues. Previous right hip arthroplasty. IMPRESSION: Left hip arthroplasty without immediate postoperative complication. Electronically Signed   By: Andrea Gasman M.D.   On: 02/07/2024 14:20   DG HIP UNILAT WITH PELVIS 1V LEFT Result Date: 02/07/2024 CLINICAL DATA:  Elective surgery. EXAM: DG HIP (WITH OR WITHOUT PELVIS) 1V*L* COMPARISON:  None Available. FINDINGS: Eight fluoroscopic spot views of the pelvis and left hip obtained in the operating room. Sequential images during hip arthroplasty. Fluoroscopy time 7 seconds. Dose 0.71 mGy. Previous right hip arthroplasty. IMPRESSION: Intraoperative fluoroscopy during left hip arthroplasty. Electronically Signed   By: Andrea Gasman M.D.   On: 02/07/2024 14:20   DG C-Arm 1-60 Min-No Report Result Date: 02/07/2024 Fluoroscopy was utilized by the requesting physician.  No radiographic interpretation.    Disposition: Discharge disposition: 01-Home or Self Care       Discharge Instructions     Call MD / Call 911   Complete by: As directed    If you experience chest pain or shortness of breath, CALL 911 and be transported to the hospital emergency room.  If you develope a fever above 101 F, pus (white drainage) or increased drainage or redness at the wound, or calf pain, call your surgeon's office.    Change dressing   Complete by: As directed    You have an adhesive waterproof bandage over the incision. Leave this in place until your first follow-up appointment. Once you remove this you will not need to place another bandage.   Constipation Prevention   Complete by: As directed    Drink plenty of fluids.  Prune juice may be helpful.  You may use a stool softener, such as Colace (over the counter) 100 mg twice a day.  Use MiraLax  (over the counter) for constipation as needed.   Diet - low sodium heart healthy   Complete by: As directed    Do not sit on low chairs, stoools or toilet seats, as it may be difficult to get up from low surfaces   Complete by: As directed    Driving restrictions  Complete by: As directed    No driving for two weeks   Post-operative opioid taper instructions:   Complete by: As directed    POST-OPERATIVE OPIOID TAPER INSTRUCTIONS: It is important to wean off of your opioid medication as soon as possible. If you do not need pain medication after your surgery it is ok to stop day one. Opioids include: Codeine, Hydrocodone (Norco, Vicodin), Oxycodone (Percocet, oxycontin ) and hydromorphone  amongst others.  Long term and even short term use of opiods can cause: Increased pain response Dependence Constipation Depression Respiratory depression And more.  Withdrawal symptoms can include Flu like symptoms Nausea, vomiting And more Techniques to manage these symptoms Hydrate well Eat regular healthy meals Stay active Use relaxation techniques(deep breathing, meditating, yoga) Do Not substitute Alcohol to help with tapering If you have been on opioids for less than two weeks and do not have pain than it is ok to stop all together.  Plan to wean off of opioids This plan should start within one week post op of your joint replacement. Maintain the same interval or time between taking each dose and first decrease the dose.  Cut the total daily intake of opioids by  one tablet each day Next start to increase the time between doses. The last dose that should be eliminated is the evening dose.      TED hose   Complete by: As directed    Use stockings (TED hose) for three weeks on both leg(s).  You may remove them at night for sleeping.   Weight bearing as tolerated   Complete by: As directed         Follow-up Information     Melodi Lerner, MD. Go on 02/20/2024.   Specialty: Orthopedic Surgery Why: You are scheduled for a post op appointment on Tuesday 02/20/24 at 2:00pm Contact information: [address] [place] [address] [phone]                  Signed: Roxie Mess 02/12/2024, 10:02 AM

## 2024-03-05 ENCOUNTER — Encounter: Payer: Self-pay | Admitting: Family Medicine

## 2024-03-29 ENCOUNTER — Encounter: Payer: Self-pay | Admitting: Cardiology

## 2024-04-02 ENCOUNTER — Telehealth: Payer: Self-pay | Admitting: *Deleted

## 2024-04-02 MED ORDER — FUROSEMIDE 20 MG PO TABS: 20.0000 mg | ORAL_TABLET | Freq: Every day | ORAL | 11 refills | Status: AC | PRN

## 2024-04-02 NOTE — Telephone Encounter (Signed)
 Sara Jayson MATSU, MD to Me  (Selected Message)     04/01/24  4:30 PM Lasix  20 mg PRN for leg swelling can be refilled.  Order placed and pt notified via MyChart.

## 2024-04-03 ENCOUNTER — Ambulatory Visit: Admitting: Family Medicine

## 2024-04-08 ENCOUNTER — Other Ambulatory Visit: Payer: Self-pay | Admitting: Cardiology

## 2024-04-08 DIAGNOSIS — E782 Mixed hyperlipidemia: Secondary | ICD-10-CM

## 2024-05-01 ENCOUNTER — Ambulatory Visit: Payer: Self-pay | Admitting: Nurse Practitioner
